# Patient Record
Sex: Female | Born: 1960 | Race: Black or African American | Hispanic: No | Marital: Single | Smoking: Never smoker
Health system: Southern US, Community
[De-identification: ages and names within clinical notes are randomized; demographics above are authoritative.]

## PROBLEM LIST (undated history)

## (undated) DIAGNOSIS — E669 Obesity, unspecified: Secondary | ICD-10-CM

## (undated) DIAGNOSIS — I10 Essential (primary) hypertension: Secondary | ICD-10-CM

---

## 2008-11-28 ENCOUNTER — Ambulatory Visit: Payer: Self-pay | Admitting: Family Medicine

## 2008-12-05 ENCOUNTER — Ambulatory Visit: Payer: Self-pay | Admitting: Family Medicine

## 2009-11-06 ENCOUNTER — Ambulatory Visit: Payer: Self-pay | Admitting: Family Medicine

## 2011-12-23 ENCOUNTER — Emergency Department: Payer: Self-pay

## 2012-02-14 ENCOUNTER — Emergency Department: Payer: Self-pay | Admitting: Emergency Medicine

## 2013-08-14 ENCOUNTER — Encounter: Payer: Self-pay | Admitting: Surgery

## 2013-08-30 ENCOUNTER — Encounter: Payer: Self-pay | Admitting: Surgery

## 2013-09-30 ENCOUNTER — Encounter: Payer: Self-pay | Admitting: Surgery

## 2013-10-31 ENCOUNTER — Encounter: Payer: Self-pay | Admitting: Surgery

## 2013-11-07 ENCOUNTER — Encounter: Payer: Self-pay | Admitting: General Surgery

## 2013-11-30 ENCOUNTER — Encounter: Payer: Self-pay | Admitting: Surgery

## 2013-11-30 ENCOUNTER — Encounter: Payer: Self-pay | Admitting: General Surgery

## 2013-12-31 ENCOUNTER — Encounter: Payer: Self-pay | Admitting: Surgery

## 2014-01-30 ENCOUNTER — Encounter: Payer: Self-pay | Admitting: Surgery

## 2018-01-01 ENCOUNTER — Other Ambulatory Visit: Payer: Self-pay

## 2018-01-01 ENCOUNTER — Emergency Department: Payer: BLUE CROSS/BLUE SHIELD

## 2018-01-01 ENCOUNTER — Encounter: Payer: Self-pay | Admitting: *Deleted

## 2018-01-01 ENCOUNTER — Inpatient Hospital Stay
Admission: EM | Admit: 2018-01-01 | Discharge: 2018-01-21 | DRG: 870 | Disposition: A | Discharge status: Skilled Nursing Facility | Payer: BLUE CROSS/BLUE SHIELD | Attending: Internal Medicine | Admitting: Internal Medicine

## 2018-01-01 DIAGNOSIS — K7589 Other specified inflammatory liver diseases: Secondary | ICD-10-CM | POA: Diagnosis present

## 2018-01-01 DIAGNOSIS — Z66 Do not resuscitate: Secondary | ICD-10-CM | POA: Diagnosis present

## 2018-01-01 DIAGNOSIS — J9622 Acute and chronic respiratory failure with hypercapnia: Secondary | ICD-10-CM | POA: Diagnosis present

## 2018-01-01 DIAGNOSIS — E872 Acidosis: Secondary | ICD-10-CM | POA: Diagnosis present

## 2018-01-01 DIAGNOSIS — M6282 Rhabdomyolysis: Secondary | ICD-10-CM

## 2018-01-01 DIAGNOSIS — Z4659 Encounter for fitting and adjustment of other gastrointestinal appliance and device: Secondary | ICD-10-CM

## 2018-01-01 DIAGNOSIS — T8579XA Infection and inflammatory reaction due to other internal prosthetic devices, implants and grafts, initial encounter: Secondary | ICD-10-CM

## 2018-01-01 DIAGNOSIS — Z713 Dietary counseling and surveillance: Secondary | ICD-10-CM

## 2018-01-01 DIAGNOSIS — E870 Hyperosmolality and hypernatremia: Secondary | ICD-10-CM | POA: Diagnosis not present

## 2018-01-01 DIAGNOSIS — K5900 Constipation, unspecified: Secondary | ICD-10-CM | POA: Diagnosis present

## 2018-01-01 DIAGNOSIS — A419 Sepsis, unspecified organism: Secondary | ICD-10-CM | POA: Diagnosis present

## 2018-01-01 DIAGNOSIS — I890 Lymphedema, not elsewhere classified: Secondary | ICD-10-CM | POA: Diagnosis present

## 2018-01-01 DIAGNOSIS — W19XXXA Unspecified fall, initial encounter: Secondary | ICD-10-CM

## 2018-01-01 DIAGNOSIS — H109 Unspecified conjunctivitis: Secondary | ICD-10-CM | POA: Diagnosis present

## 2018-01-01 DIAGNOSIS — N179 Acute kidney failure, unspecified: Secondary | ICD-10-CM | POA: Diagnosis present

## 2018-01-01 DIAGNOSIS — R0602 Shortness of breath: Secondary | ICD-10-CM

## 2018-01-01 DIAGNOSIS — R6521 Severe sepsis with septic shock: Secondary | ICD-10-CM | POA: Diagnosis present

## 2018-01-01 DIAGNOSIS — E875 Hyperkalemia: Secondary | ICD-10-CM | POA: Diagnosis present

## 2018-01-01 DIAGNOSIS — I248 Other forms of acute ischemic heart disease: Secondary | ICD-10-CM | POA: Diagnosis present

## 2018-01-01 DIAGNOSIS — R296 Repeated falls: Secondary | ICD-10-CM | POA: Diagnosis present

## 2018-01-01 DIAGNOSIS — I5031 Acute diastolic (congestive) heart failure: Secondary | ICD-10-CM | POA: Diagnosis not present

## 2018-01-01 DIAGNOSIS — J9811 Atelectasis: Secondary | ICD-10-CM | POA: Diagnosis present

## 2018-01-01 DIAGNOSIS — K7200 Acute and subacute hepatic failure without coma: Secondary | ICD-10-CM | POA: Diagnosis present

## 2018-01-01 DIAGNOSIS — T829XXA Unspecified complication of cardiac and vascular prosthetic device, implant and graft, initial encounter: Secondary | ICD-10-CM

## 2018-01-01 DIAGNOSIS — I110 Hypertensive heart disease with heart failure: Secondary | ICD-10-CM | POA: Diagnosis present

## 2018-01-01 DIAGNOSIS — L304 Erythema intertrigo: Secondary | ICD-10-CM | POA: Diagnosis present

## 2018-01-01 DIAGNOSIS — Y95 Nosocomial condition: Secondary | ICD-10-CM | POA: Diagnosis present

## 2018-01-01 DIAGNOSIS — R0902 Hypoxemia: Secondary | ICD-10-CM

## 2018-01-01 DIAGNOSIS — E662 Morbid (severe) obesity with alveolar hypoventilation: Secondary | ICD-10-CM | POA: Diagnosis present

## 2018-01-01 DIAGNOSIS — E871 Hypo-osmolality and hyponatremia: Secondary | ICD-10-CM | POA: Diagnosis present

## 2018-01-01 DIAGNOSIS — S81801A Unspecified open wound, right lower leg, initial encounter: Secondary | ICD-10-CM | POA: Diagnosis present

## 2018-01-01 DIAGNOSIS — J189 Pneumonia, unspecified organism: Secondary | ICD-10-CM | POA: Diagnosis present

## 2018-01-01 DIAGNOSIS — A4102 Sepsis due to Methicillin resistant Staphylococcus aureus: Secondary | ICD-10-CM | POA: Diagnosis present

## 2018-01-01 DIAGNOSIS — G92 Toxic encephalopathy: Secondary | ICD-10-CM | POA: Diagnosis present

## 2018-01-01 DIAGNOSIS — L03115 Cellulitis of right lower limb: Secondary | ICD-10-CM

## 2018-01-01 DIAGNOSIS — Z6841 Body Mass Index (BMI) 40.0 and over, adult: Secondary | ICD-10-CM

## 2018-01-01 DIAGNOSIS — J9602 Acute respiratory failure with hypercapnia: Secondary | ICD-10-CM | POA: Diagnosis not present

## 2018-01-01 DIAGNOSIS — Z23 Encounter for immunization: Secondary | ICD-10-CM

## 2018-01-01 DIAGNOSIS — E876 Hypokalemia: Secondary | ICD-10-CM | POA: Diagnosis present

## 2018-01-01 DIAGNOSIS — Y92009 Unspecified place in unspecified non-institutional (private) residence as the place of occurrence of the external cause: Secondary | ICD-10-CM

## 2018-01-01 DIAGNOSIS — E869 Volume depletion, unspecified: Secondary | ICD-10-CM | POA: Diagnosis present

## 2018-01-01 DIAGNOSIS — Z9911 Dependence on respirator [ventilator] status: Secondary | ICD-10-CM

## 2018-01-01 DIAGNOSIS — Z515 Encounter for palliative care: Secondary | ICD-10-CM | POA: Diagnosis not present

## 2018-01-01 DIAGNOSIS — L89159 Pressure ulcer of sacral region, unspecified stage: Secondary | ICD-10-CM | POA: Diagnosis present

## 2018-01-01 DIAGNOSIS — J9690 Respiratory failure, unspecified, unspecified whether with hypoxia or hypercapnia: Secondary | ICD-10-CM

## 2018-01-01 DIAGNOSIS — L8990 Pressure ulcer of unspecified site, unspecified stage: Secondary | ICD-10-CM

## 2018-01-01 DIAGNOSIS — J9621 Acute and chronic respiratory failure with hypoxia: Secondary | ICD-10-CM | POA: Diagnosis present

## 2018-01-01 DIAGNOSIS — Z452 Encounter for adjustment and management of vascular access device: Secondary | ICD-10-CM

## 2018-01-01 DIAGNOSIS — I2489 Other forms of acute ischemic heart disease: Secondary | ICD-10-CM

## 2018-01-01 DIAGNOSIS — Z7189 Other specified counseling: Secondary | ICD-10-CM | POA: Diagnosis not present

## 2018-01-01 DIAGNOSIS — J9600 Acute respiratory failure, unspecified whether with hypoxia or hypercapnia: Secondary | ICD-10-CM

## 2018-01-01 DIAGNOSIS — J9801 Acute bronchospasm: Secondary | ICD-10-CM | POA: Diagnosis present

## 2018-01-01 DIAGNOSIS — R509 Fever, unspecified: Secondary | ICD-10-CM

## 2018-01-01 DIAGNOSIS — I878 Other specified disorders of veins: Secondary | ICD-10-CM | POA: Diagnosis present

## 2018-01-01 HISTORY — DX: Obesity, unspecified: E66.9

## 2018-01-01 HISTORY — DX: Essential (primary) hypertension: I10

## 2018-01-01 LAB — MRSA PCR SCREENING: MRSA by PCR: POSITIVE — AB

## 2018-01-01 LAB — COMPREHENSIVE METABOLIC PANEL
ALT: 303 U/L — ABNORMAL HIGH (ref 0–44)
ANION GAP: 10 (ref 5–15)
AST: 912 U/L — AB (ref 15–41)
Albumin: 3.2 g/dL — ABNORMAL LOW (ref 3.5–5.0)
Alkaline Phosphatase: 47 U/L (ref 38–126)
BUN: 25 mg/dL — AB (ref 6–20)
CO2: 31 mmol/L (ref 22–32)
Calcium: 8.5 mg/dL — ABNORMAL LOW (ref 8.9–10.3)
Chloride: 107 mmol/L (ref 98–111)
Creatinine, Ser: 1.58 mg/dL — ABNORMAL HIGH (ref 0.44–1.00)
GFR calc Af Amer: 41 mL/min — ABNORMAL LOW (ref >60)
GFR, EST NON AFRICAN AMERICAN: 36 mL/min — AB (ref >60)
GLUCOSE: 140 mg/dL — AB (ref 70–99)
POTASSIUM: 3.7 mmol/L (ref 3.5–5.1)
Sodium: 148 mmol/L — ABNORMAL HIGH (ref 135–145)
Total Bilirubin: 1.4 mg/dL — ABNORMAL HIGH (ref 0.3–1.2)
Total Protein: 7.7 g/dL (ref 6.5–8.1)

## 2018-01-01 LAB — URINALYSIS, COMPLETE (UACMP) WITH MICROSCOPIC
BACTERIA UA: NONE SEEN
Bilirubin Urine: NEGATIVE
GLUCOSE, UA: NEGATIVE mg/dL
KETONES UR: NEGATIVE mg/dL
LEUKOCYTES UA: NEGATIVE
NITRITE: NEGATIVE
PH: 5 (ref 5.0–8.0)
Protein, ur: 100 mg/dL — AB
RBC / HPF: >50 RBC/hpf — ABNORMAL HIGH (ref 0–5)
SPECIFIC GRAVITY, URINE: 1.021 (ref 1.005–1.030)
Squamous Epithelial / LPF: NONE SEEN (ref 0–5)

## 2018-01-01 LAB — CK: Total CK: 27818 U/L — ABNORMAL HIGH (ref 38–234)

## 2018-01-01 LAB — CBC WITH DIFFERENTIAL/PLATELET
Abs Immature Granulocytes: 0.06 10*3/uL (ref 0.00–0.07)
BASOS ABS: 0 10*3/uL (ref 0.0–0.1)
Basophils Relative: 0 %
EOS ABS: 0 10*3/uL (ref 0.0–0.5)
Eosinophils Relative: 0 %
HCT: 45.8 % (ref 36.0–46.0)
Hemoglobin: 13.8 g/dL (ref 12.0–15.0)
IMMATURE GRANULOCYTES: 1 %
Lymphocytes Relative: 7 %
Lymphs Abs: 0.8 10*3/uL (ref 0.7–4.0)
MCH: 33.4 pg (ref 26.0–34.0)
MCHC: 30.1 g/dL (ref 30.0–36.0)
MCV: 110.9 fL — ABNORMAL HIGH (ref 80.0–100.0)
Monocytes Absolute: 0.8 10*3/uL (ref 0.1–1.0)
Monocytes Relative: 8 %
NEUTROS PCT: 84 %
NRBC: 0.2 % (ref 0.0–0.2)
Neutro Abs: 9.1 10*3/uL — ABNORMAL HIGH (ref 1.7–7.7)
PLATELETS: 226 10*3/uL (ref 150–400)
RBC: 4.13 MIL/uL (ref 3.87–5.11)
RDW: 18.2 % — AB (ref 11.5–15.5)
WBC: 10.8 10*3/uL — AB (ref 4.0–10.5)

## 2018-01-01 LAB — LACTIC ACID, PLASMA
LACTIC ACID, VENOUS: 2.3 mmol/L — AB (ref 0.5–1.9)
LACTIC ACID, VENOUS: 2.8 mmol/L — AB (ref 0.5–1.9)

## 2018-01-01 LAB — HCG, QUANTITATIVE, PREGNANCY: HCG, BETA CHAIN, QUANT, S: 1 m[IU]/mL (ref <5)

## 2018-01-01 LAB — TROPONIN I: Troponin I: 0.24 ng/mL (ref <0.03)

## 2018-01-01 LAB — GLUCOSE, CAPILLARY: GLUCOSE-CAPILLARY: 110 mg/dL — AB (ref 70–99)

## 2018-01-01 LAB — LIPASE, BLOOD: LIPASE: 33 U/L (ref 11–51)

## 2018-01-01 MED ORDER — ALBUTEROL SULFATE (2.5 MG/3ML) 0.083% IN NEBU
2.5000 mg | INHALATION_SOLUTION | RESPIRATORY_TRACT | Status: DC | PRN
  Administered 2018-01-08 – 2018-01-11 (×4): 2.5 mg via RESPIRATORY_TRACT
  Filled 2018-01-01 (×4): qty 3

## 2018-01-01 MED ORDER — ONDANSETRON HCL 4 MG/2ML IJ SOLN: 4.0000 mg | Freq: Four times a day (QID) | INTRAMUSCULAR | Status: DC | PRN

## 2018-01-01 MED ORDER — SODIUM CHLORIDE 0.9 % IV SOLN
INTRAVENOUS | Status: DC
  Administered 2018-01-01: 23:00:00 via INTRAVENOUS

## 2018-01-01 MED ORDER — ACETAMINOPHEN 650 MG RE SUPP: 650.0000 mg | Freq: Four times a day (QID) | RECTAL | Status: DC | PRN

## 2018-01-01 MED ORDER — POLYETHYLENE GLYCOL 3350 17 G PO PACK
17.0000 g | PACK | Freq: Every day | ORAL | Status: DC | PRN
  Filled 2018-01-01: qty 1

## 2018-01-01 MED ORDER — ENOXAPARIN SODIUM 40 MG/0.4ML ~~LOC~~ SOLN
40.0000 mg | Freq: Two times a day (BID) | SUBCUTANEOUS | Status: DC
  Administered 2018-01-01 – 2018-01-21 (×39): 40 mg via SUBCUTANEOUS
  Filled 2018-01-01 (×39): qty 0.4

## 2018-01-01 MED ORDER — SODIUM CHLORIDE 0.9 % IV SOLN
2.0000 g | Freq: Two times a day (BID) | INTRAVENOUS | Status: AC
  Administered 2018-01-01 – 2018-01-07 (×13): 2 g via INTRAVENOUS
  Filled 2018-01-01 (×13): qty 2

## 2018-01-01 MED ORDER — SODIUM CHLORIDE 0.9 % IV BOLUS: 1500.0000 mL | Freq: Once | INTRAVENOUS | Status: DC

## 2018-01-01 MED ORDER — ONDANSETRON HCL 4 MG PO TABS: 4.0000 mg | ORAL_TABLET | Freq: Four times a day (QID) | ORAL | Status: DC | PRN

## 2018-01-01 MED ORDER — SODIUM CHLORIDE 0.9 % IV SOLN
2.0000 g | INTRAVENOUS | Status: DC
  Administered 2018-01-01: 2 g via INTRAVENOUS
  Filled 2018-01-01 (×2): qty 20

## 2018-01-01 MED ORDER — VANCOMYCIN HCL IN DEXTROSE 1-5 GM/200ML-% IV SOLN
1000.0000 mg | Freq: Once | INTRAVENOUS | Status: AC
  Administered 2018-01-01: 1000 mg via INTRAVENOUS
  Filled 2018-01-01: qty 200

## 2018-01-01 MED ORDER — VANCOMYCIN HCL IN DEXTROSE 750-5 MG/150ML-% IV SOLN
750.0000 mg | Freq: Once | INTRAVENOUS | Status: AC
  Administered 2018-01-01: 750 mg via INTRAVENOUS
  Filled 2018-01-01: qty 150

## 2018-01-01 MED ORDER — SODIUM CHLORIDE 0.9 % IV BOLUS
500.0000 mL | Freq: Once | INTRAVENOUS | Status: AC
  Administered 2018-01-01: 500 mL via INTRAVENOUS

## 2018-01-01 MED ORDER — SODIUM CHLORIDE 0.9 % IV BOLUS: 500.0000 mL | Freq: Once | INTRAVENOUS | Status: DC

## 2018-01-01 MED ORDER — SODIUM CHLORIDE 0.9 % IV BOLUS
1000.0000 mL | Freq: Once | INTRAVENOUS | Status: AC
  Administered 2018-01-01: 1000 mL via INTRAVENOUS

## 2018-01-01 MED ORDER — ACETAMINOPHEN 325 MG PO TABS: 650.0000 mg | ORAL_TABLET | Freq: Four times a day (QID) | ORAL | Status: DC | PRN

## 2018-01-01 MED ORDER — SODIUM CHLORIDE 0.9% FLUSH
3.0000 mL | Freq: Two times a day (BID) | INTRAVENOUS | Status: DC
  Administered 2018-01-01 – 2018-01-05 (×8): 3 mL via INTRAVENOUS

## 2018-01-01 MED ORDER — VANCOMYCIN HCL 10 G IV SOLR
1750.0000 mg | Freq: Two times a day (BID) | INTRAVENOUS | Status: DC
  Administered 2018-01-02: 1750 mg via INTRAVENOUS
  Filled 2018-01-01 (×3): qty 1750

## 2018-01-01 NOTE — ED Notes (Addendum)
Date and time results received: 01/01/18 1940 (use smartphrase ".now" to insert current time)  Test: Lactic Critical Value: 2.3  Name of Provider Notified: Dr. Jacqualine Code  Orders Received? Or Actions Taken?: Provider aware

## 2018-01-01 NOTE — ED Provider Notes (Signed)
[facility] Emergency Department Provider Note   ____________________________________________   First MD Initiated Contact with Patient 01/01/18 1616     (approximate)  I have reviewed the triage vital signs and the nursing notes.   HISTORY  Chief Complaint Fall    HPI Stephanie [name] is a 57 y.o. female reports a history of hypertension  Patient reports that she went to work about 3 days ago, she had to work longer than normal and felt very tired.  She got home and since then she is had 3 times where she is become fatigued, she is head which she describes is not really falls but more like slides to the floor.  She has trouble with her right lower leg for a long time, and she feels that she cannot support her weight any longer.  She denies fevers or chills.  No nausea or vomiting.  No chest pain or trouble breathing  Denies head injury, no neck pain.  Did not strike her head and she reports during the fall she is really slid down onto her buttock and not been able to get up, she has not had any type of actual "hard fall".  She is not in any pain right now but feels very fatigued.  She reports she is just been getting worse the last couple of days.  Head of the paramedics come out several times, and she said this time she felt she need to come the hospital as she could not continue to her own care.  She also reports several areas where her skin has gotten broken from falls.   Past Medical History:  Diagnosis Date  . Hypertension   . Obesity     There are no active problems to display for this patient.   Past medical history Hypertension  Prior to Admission medications   Not on File    Allergies Patient has no known allergies.  No family history on file.  Social History Social History   Tobacco Use  . Smoking status: Never Smoker  . Smokeless tobacco: Never Used  Substance Use Topics  . Alcohol use: Not Currently  . Drug use: Not Currently   Denies drug abuse, no acute alcohol use  Review of Systems Constitutional: No fever/chills feeling very fatigued last 3 days Eyes: No visual changes. ENT: No sore throat. Cardiovascular: Denies chest pain. Respiratory: Denies shortness of breath. Gastrointestinal: No abdominal pain.   Genitourinary: Negative for dysuria. Musculoskeletal: Negative for back pain.  Trouble with her right knee for some time now, very swollen for months in both legs and keeps a Ace wrap around right lower leg it at times due to  skin injury Skin: Reports some rashes on her arms and legs Neurological: Negative for headaches, areas of focal weakness or numbness.    ____________________________________________   PHYSICAL EXAM:  VITAL SIGNS: ED Triage Vitals  Enc Vitals Group     BP      Pulse      Resp      Temp      Temp src      SpO2      Weight      Height      Head Circumference      Peak Flow      Pain Score      Pain Loc      Pain Edu?      Excl. in [place]?     Constitutional: Alert and oriented.  Moderately  ill-appearing but in no acute distress.  Morbid obesity is noted.  She is alert with normal mental status Eyes: Conjunctivae are injected bilaterally with slight purulent discharge from each Head: Atraumatic. Nose: No congestion/rhinnorhea. Mouth/Throat: Mucous membranes are slightly dry. Neck: No stridor.  Cardiovascular: Tachycardic rate, regular rhythm. Grossly normal heart sounds.  Good peripheral circulation. Respiratory: Normal respiratory effort for just slightly elevated rate.  No retractions. Lungs CTAB.  Diminished lung sounds throughout. Gastrointestinal: Soft and nontender. No distention.  Morbidly obese.  Skin folds evaluated and she certainly has satellite lesions and evidence of early skin breakdown and redness and multiple intertriginous skin folds. Musculoskeletal: No lower extremity tenderness but she has notable edema in both lower extremities, right lower extremity  that she had wrapped has a notable defect in it from where she had previous Ace wrap on it, this was taken down and her capillary refill in both feet is normal but she has notable severe edema involving both lower extremities. Neurologic:  Normal speech and language. No gross focal neurologic deficits are appreciated.  Skin:  Skin is warm, dry with multiple areas with skin tears redness and some with some slight purulence appearance over the right posterior leg.  There is no crepitance in any area. Psychiatric: Mood and affect are normal. Speech and behavior are normal.  ____________________________________________   LABS (all labs ordered are listed, but only abnormal results are displayed)  Labs Reviewed  CBC WITH DIFFERENTIAL/PLATELET - Abnormal; Notable for the following components:      Result Value   WBC 10.8 (*)    MCV 110.9 (*)    RDW 18.2 (*)    Neutro Abs 9.1 (*)    All other components within normal limits  COMPREHENSIVE METABOLIC PANEL - Abnormal; Notable for the following components:   Sodium 148 (*)    Glucose, Bld 140 (*)    BUN 25 (*)    Creatinine, Ser 1.58 (*)    Calcium 8.5 (*)    Albumin 3.2 (*)    AST 912 (*)    ALT 303 (*)    Total Bilirubin 1.4 (*)    GFR calc non Af Amer 36 (*)    GFR calc Af Amer 41 (*)    All other components within normal limits  TROPONIN I - Abnormal; Notable for the following components:   Troponin I 0.24 (*)    All other components within normal limits  URINALYSIS, COMPLETE (UACMP) WITH MICROSCOPIC - Abnormal; Notable for the following components:   Color, Urine AMBER (*)    APPearance TURBID (*)    Hgb urine dipstick LARGE (*)    Protein, ur 100 (*)    RBC / HPF >50 (*)    All other components within normal limits  CK - Abnormal; Notable for the following components:   Total CK 27,818 (*)    All other components within normal limits  CULTURE, BLOOD (ROUTINE X 2)  CULTURE, BLOOD (ROUTINE X 2)  LIPASE, BLOOD  HCG,  QUANTITATIVE, PREGNANCY  LACTIC ACID, PLASMA   ____________________________________________  EKG  Reviewed and entered by me at 1710 Heart rate 149 QRS 80 QTc 450 Atrial fibrillation suspected, there are some areas however were P waves may be seen in V3, though it is slightly irregular and I do not see obviously discernible P waves making me feel this is likely A. Fib there is nonspecific T wave abnormality but no obvious acute ischemia denoted ____________________________________________  RADIOLOGY  Dg Chest Port 1  View  Result Date: 01/01/2018 CLINICAL DATA:  Fall.  Morbid obesity. EXAM: PORTABLE CHEST 1 VIEW COMPARISON:  None. FINDINGS: The study is limited by the low volume portable technique in positioning. There appears to be significant enlarged of the cardiac silhouette which may represent cardiomegaly. No pneumothorax. No nodules or masses. No obvious infiltrates. The left retrocardiac region is not well assessed. IMPRESSION: 1. Enlargement of the cardiac silhouette. The study is limited but no focal infiltrate noted. The left retrocardiac region in particular is not well evaluated. Electronically Signed   By: Dorise Bullion III M.D   On: 01/01/2018 19:07   Dg Tibia/fibula Right Port  Result Date: 01/01/2018 CLINICAL DATA:  Fall. EXAM: PORTABLE RIGHT TIBIA AND FIBULA - 2 VIEW COMPARISON:  None. FINDINGS: The study is limited due to poor penetration due to the patient's body habitus. No definitive fractures noted. IMPRESSION: Limited study due to body habitus.  No obvious fractures. Electronically Signed   By: Dorise Bullion III M.D   On: 01/01/2018 19:08     ____________________________________________   PROCEDURES  Procedure(s) performed: None  Procedures  Critical Care performed: Yes, see critical care note(s)  CRITICAL CARE Performed by: Delman Kitten   Total critical care time: 40 minutes  Critical care time was exclusive of separately billable procedures and  treating other patients.  Critical care was necessary to treat or prevent imminent or life-threatening deterioration.  Critical care was time spent personally by me on the following activities: development of treatment plan with patient and/or surrogate as well as nursing, discussions with consultants, evaluation of patient's response to treatment, examination of patient, obtaining history from patient or surrogate, ordering and performing treatments and interventions, ordering and review of laboratory studies, ordering and review of radiographic studies, pulse oximetry and re-evaluation of patient's condition.  Patient presents, found to have elevated troponin, severe rhabdomyolysis, tachycardia with suspected new onset A. fib, weakness and associated cellulitis with sepsis.  Patient requiring fluid boluses, multiple antibiotics, admission for life stabilizing treatment and care. ____________________________________________   INITIAL IMPRESSION / ASSESSMENT AND PLAN / ED COURSE  Pertinent labs & imaging results that were available during my care of the patient were reviewed by me and considered in my medical decision making (see chart for details).   Fall, fatigue and increasing weakness.  Moves all extremities grossly with normal strength and denies head injury.  No numbness or tingling.  No evidence of a central neurologic process, but sounds a gradual worsening over the last few days time with obvious intertriginous skin fold likely fungal infection in also notable skin breakdown in multiple areas with concern for cellulitis especially involving the posterior right lower leg.  Will start on broad-spectrum antibiotics and I suspect her tachycardia may be secondary to possible sepsis or other metabolic demand.  Broad-spectrum antibiotic ordered.  Patient severe morbid obesity will certainly play into her recovery.,  Initiate early antibiotics, gentle IV fluids as we await imaging studies though  given the patient's body habitus I am not certain we will be able to obtain any easily interpretable chest films.  No head injury, no central neurologic abnormality on examination or gross neurologic deficit, denies striking her head and takes no anticoagulants.  Patient does not appear to be in need of acute CT imaging at this time.  No chest pain no shortness of breath per the patient but does have slight tachypnea.    ----------------------------------------- 7:37 PM on 01/01/2018 -----------------------------------------  Sepsis reassessment completed, patient heart rate slightly  improved 130s, blood pressure 109/92, mentating well with normal oxygen saturation on nasal cannula.  Does appear slightly improved, but still in critical condition with out abdominal pain but findings concerning for shock liver.  Patient be admitted for further care and work-up to the hospitalist service at this time.  ____________________________________________   FINAL CLINICAL IMPRESSION(S) / ED DIAGNOSES  Final diagnoses:  Fall  Demand ischemia ([place])  Cellulitis of right lower extremity  Shock liver  Non-traumatic rhabdomyolysis        Note:  This document was prepared using Dragon voice recognition software and may include unintentional dictation errors       Delman Kitten, MD 01/01/18 1937

## 2018-01-01 NOTE — ED Notes (Signed)
Attempted report, ICU still needs to approve bed. Number given to RN to call when ready.

## 2018-01-01 NOTE — ED Notes (Signed)
Foley draining dark  Brown  Cloudy urine   Pt remains  Awake  And  Alert   And  Oriented

## 2018-01-01 NOTE — Progress Notes (Signed)
Pharmacy Antibiotic Note  Stephanie [name] is a 57 y.o. female admitted on 01/01/2018 with wound infection.  Pharmacy has been consulted for Cefepime dosing.  Plan: Will start cefepime 2 gm IV Q12H on 11/2 @ 18:30.  Height: 5\' 4"  (162.6 cm) Weight: (!) 518 lb (235 kg) IBW/kg (Calculated) : 54.7  Temp (24hrs), Avg:98.6 F (37 C), Min:98.6 F (37 C), Max:98.6 F (37 C)  Recent Labs  Lab 01/01/18 1649  WBC 10.8*  CREATININE 1.58*    Estimated Creatinine Clearance: 80.5 mL/min (A) (by C-G formula based on SCr of 1.58 mg/dL (H)).    Allergies no known allergies  Antimicrobials this admission:   >>    >>   Dose adjustments this admission:   Microbiology results:  BCx:   UCx:    Sputum:    MRSA PCR:   Thank you for allowing pharmacy to be a part of this patient's care.  Hagar Sadiq D 01/01/2018 6:27 PM

## 2018-01-01 NOTE — ED Triage Notes (Signed)
Pt is  Morbidly obese    Arrived  Via ems  Where  She was  Brought in because she has been found on floor after  Falling  Pt has  Multiple skin tears  And    Many bandages  And  Swollen  Extremities     Pt  Has  Purulent drainage and  4 t edema   Of  r  Lower  Leg  Skin tears  Of  Both lower legs   She  Has  Red drainage  From both eyes    Pt  Has  superficial  Skin breakdown l  Side  Abdomen   And  Skin breakdown   Of  Buttocks  Area pt is awake and alert

## 2018-01-01 NOTE — H&P (Addendum)
[facility] [place] NAME: Stephanie [name]    MR#:  [id number]  DATE OF BIRTH:  1960-07-02  DATE OF ADMISSION:  01/01/2018  PRIMARY CARE PHYSICIAN: Patient, No Pcp Per   REQUESTING/REFERRING PHYSICIAN: Dr. Jacqualine [name]  CHIEF COMPLAINT:   Chief Complaint  Patient presents with  . Fall    HISTORY OF PRESENT ILLNESS:  Stephanie [name]  is a 57 y.o. female with a known history of hypertension, morbid obesity presented to the emergency room due to extreme fatigue, falls.  Patient was found to have multiple areas of skin breakdown.  Tachycardia, hypoxic respiratory failure. She has had trouble with a chronic wound on her right leg which has gotten weaker over time.  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 She was found on the floor and brought in by EMS.  Foley catheter placed in dark urine. Chest x-ray and right foot x-ray is still pending. No chest pain or abdominal pain.  PAST MEDICAL HISTORY:   Past Medical History:  Diagnosis Date  . Hypertension   . Obesity     PAST SURGICAL HISTORY:  No past surgeries SOCIAL HISTORY:   Social History   Tobacco Use  . Smoking status: Never Smoker  . Smokeless tobacco: Never Used  Substance Use Topics  . Alcohol use: Not Currently    FAMILY HISTORY:  No family history on file. Attention DRUG ALLERGIES:  Allergies no known allergies  REVIEW OF SYSTEMS:   Review of Systems  Constitutional: Positive for chills and malaise/fatigue. Negative for fever and weight loss.  HENT: Negative for hearing loss and nosebleeds.   Eyes: Negative for blurred vision, double vision and pain.  Respiratory: Negative for cough, hemoptysis, sputum production, shortness of breath and wheezing.   Cardiovascular: Negative for chest pain, palpitations, orthopnea and leg swelling.  Gastrointestinal: Negative for abdominal pain, constipation, diarrhea, nausea and vomiting.  Genitourinary:  Negative for dysuria and hematuria.  Musculoskeletal: Positive for falls. Negative for back pain and myalgias.  Skin: Negative for rash.  Neurological: Positive for dizziness. Negative for tremors, sensory change, speech change, focal weakness, seizures and headaches.  Endo/Heme/Allergies: Does not bruise/bleed easily.  Psychiatric/Behavioral: Negative for depression and memory loss. The patient is not nervous/anxious.     MEDICATIONS AT HOME:   Prior to Admission medications   Not on File     VITAL SIGNS:  Blood pressure (!) 150/117, pulse (!) 140, temperature 98.6 F (37 C), temperature source Oral, resp. rate (!) 24, height 5\' 4"  (1.626 m), weight (!) 235 kg, SpO2 92 %.  PHYSICAL EXAMINATION:  Physical Exam  GENERAL:  57 y.o.-year-old patient lying in the bed, morbidly obese. EYES: Pupils equal, round, reactive to light and accommodation. No scleral icterus. Extraocular muscles intact.  HEENT: Head atraumatic, normocephalic. Oropharynx and nasopharynx clear. No oropharyngeal erythema, moist oral mucosa  NECK:  Supple, no jugular venous distention. No thyroid enlargement, no tenderness.  LUNGS: Normal breath sounds bilaterally, no wheezing, rales, rhonchi. No use of accessory muscles of respiration.  CARDIOVASCULAR: S1, S2, tachycardia ABDOMEN: Soft, nontender, nondistended. Bowel sounds present. No organomegaly or mass.  EXTREMITIES: No pedal edema, cyanosis, or clubbing. + 2 pedal & radial pulses b/l.   NEUROLOGIC: Cranial nerves II through XII are intact. No focal Motor or sensory deficits appreciated b/l PSYCHIATRIC: The patient is alert and oriented x 3. Good affect.  SKIN: Multiple areas of skin breakdown including left abdomen, right leg with  purulent drainage.  Skin tears in bilateral legs .  Skin breakdown on buttocks  LABORATORY PANEL:   CBC Recent Labs  Lab 01/01/18 1649  WBC 10.8*  HGB 13.8  HCT 45.8  PLT 226    ------------------------------------------------------------------------------------------------------------------  Chemistries  Recent Labs  Lab 01/01/18 1649  NA 148*  K 3.7  CL 107  CO2 31  GLUCOSE 140*  BUN 25*  CREATININE 1.58*  CALCIUM 8.5*  AST 912*  ALT 303*  ALKPHOS 47  BILITOT 1.4*   ------------------------------------------------------------------------------------------------------------------  Cardiac Enzymes Recent Labs  Lab 01/01/18 1649  TROPONINI 0.24*   ------------------------------------------------------------------------------------------------------------------  RADIOLOGY:  No results found.   IMPRESSION AND PLAN:   *Sepsis secondary to right lower extremity cellulitis.   Will start broad-spectrum antibiotics with vancomycin and cefepime.  Cultures.  Follow-up on x-rays.  IV fluid normal saline bolus stat. Admit to stepdown as blood pressure is slowly trending down.  Discussed with [facility] intensivist.  Consult placed.  *Acute kidney injury secondary to sepsis.  Start IV fluids.  Monitor input and output.  Repeat labs in the morning.  *Hypertension.  Blood pressure in the normal range.  Hold medications.  *Elevated troponin due to rhabdomyolysis and demand ischemia No chest pain EKG shows no ST changes  *Acute rhabdomyolysis due to recurrent falls.  Start IV fluids.  Repeat CK in the morning.  *Elevated AST ALT due to rhabdomyolysis.  All the records are reviewed and case discussed with ED provider. Management plans discussed with the patient, family and they are in agreement.  [name] STATUS: DNR  Discussed with patient.  She tells me she had designated her mother as healthcare power of attorney in the past.  Also tells me that mom has been more confused and would like her brother to make decisions if she cannot.  No documentation in place.  We discussed regarding [name] STATUS with nurse present in the room and the patient wants to be DO  NOT RESUSCITATE and DO NOT INTUBATE.  Confirmed again.  Orders entered.  TOTAL CC TIME TAKING CARE OF THIS PATIENT: 80 minutes.   Neita Carp M.D on 01/01/2018 at 6:15 PM  Between 7am to 6pm - Pager - [phone]  After 6pm go to [email or web address] - password [email or web address] [facility]  Office  [phone]  CC: Primary care physician; Patient, No Pcp Per  Note: This dictation was prepared with Dragon dictation along with smaller phrase technology. Any transcriptional errors that result from this process are unintentional.

## 2018-01-01 NOTE — ED Notes (Signed)
Portable  X  Rays  Of leg done  By  X ray

## 2018-01-01 NOTE — Progress Notes (Signed)
CODE SEPSIS - PHARMACY COMMUNICATION  **Broad Spectrum Antibiotics should be administered within 1 hour of Sepsis diagnosis**  Time Code Sepsis Called/Page Received:  11/2 @ 17:30  Antibiotics Ordered:  Vancomycin , Ceftriaxone   Time of 1st antibiotic administration: 11/2 @ 17:51   Additional action taken by pharmacy: none  If necessary, Name of Provider/Nurse Contacted:     Mckaila Duffus D ,PharmD Clinical Pharmacist  01/01/2018  6:28 PM

## 2018-01-02 ENCOUNTER — Inpatient Hospital Stay: Payer: BLUE CROSS/BLUE SHIELD

## 2018-01-02 ENCOUNTER — Inpatient Hospital Stay: Payer: Self-pay

## 2018-01-02 ENCOUNTER — Other Ambulatory Visit: Payer: Self-pay

## 2018-01-02 ENCOUNTER — Inpatient Hospital Stay (HOSPITAL_COMMUNITY)
Admit: 2018-01-02 | Discharge: 2018-01-02 | Disposition: A | Discharge status: Home / Self Care | Payer: BLUE CROSS/BLUE SHIELD | Attending: Internal Medicine | Admitting: Internal Medicine

## 2018-01-02 DIAGNOSIS — J9601 Acute respiratory failure with hypoxia: Secondary | ICD-10-CM

## 2018-01-02 DIAGNOSIS — I5030 Unspecified diastolic (congestive) heart failure: Secondary | ICD-10-CM

## 2018-01-02 LAB — COMPREHENSIVE METABOLIC PANEL
ALBUMIN: 2.7 g/dL — AB (ref 3.5–5.0)
ALT: 287 U/L — ABNORMAL HIGH (ref 0–44)
ANION GAP: 10 (ref 5–15)
AST: 719 U/L — ABNORMAL HIGH (ref 15–41)
Alkaline Phosphatase: 43 U/L (ref 38–126)
BILIRUBIN TOTAL: 1.1 mg/dL (ref 0.3–1.2)
BUN: 25 mg/dL — ABNORMAL HIGH (ref 6–20)
CALCIUM: 8.1 mg/dL — AB (ref 8.9–10.3)
CO2: 26 mmol/L (ref 22–32)
Chloride: 113 mmol/L — ABNORMAL HIGH (ref 98–111)
Creatinine, Ser: 1.13 mg/dL — ABNORMAL HIGH (ref 0.44–1.00)
GFR calc Af Amer: >60 mL/min (ref >60)
GFR calc non Af Amer: 54 mL/min — ABNORMAL LOW (ref >60)
Glucose, Bld: 110 mg/dL — ABNORMAL HIGH (ref 70–99)
Potassium: 5.4 mmol/L — ABNORMAL HIGH (ref 3.5–5.1)
SODIUM: 149 mmol/L — AB (ref 135–145)
TOTAL PROTEIN: 6.6 g/dL (ref 6.5–8.1)

## 2018-01-02 LAB — BASIC METABOLIC PANEL
Anion gap: 7 (ref 5–15)
BUN: 24 mg/dL — ABNORMAL HIGH (ref 6–20)
CALCIUM: 7.9 mg/dL — AB (ref 8.9–10.3)
CO2: 31 mmol/L (ref 22–32)
Chloride: 112 mmol/L — ABNORMAL HIGH (ref 98–111)
Creatinine, Ser: 1.01 mg/dL — ABNORMAL HIGH (ref 0.44–1.00)
GFR calc Af Amer: >60 mL/min (ref >60)
GLUCOSE: 95 mg/dL (ref 70–99)
Potassium: 3.7 mmol/L (ref 3.5–5.1)
Sodium: 150 mmol/L — ABNORMAL HIGH (ref 135–145)

## 2018-01-02 LAB — PHOSPHORUS: Phosphorus: 3 mg/dL (ref 2.5–4.6)

## 2018-01-02 LAB — CBC
HCT: 45.4 % (ref 36.0–46.0)
Hemoglobin: 13.6 g/dL (ref 12.0–15.0)
MCH: 33.3 pg (ref 26.0–34.0)
MCHC: 30 g/dL (ref 30.0–36.0)
MCV: 111 fL — ABNORMAL HIGH (ref 80.0–100.0)
Platelets: 204 10*3/uL (ref 150–400)
RBC: 4.09 MIL/uL (ref 3.87–5.11)
RDW: 18.3 % — AB (ref 11.5–15.5)
WBC: 13.1 10*3/uL — AB (ref 4.0–10.5)
nRBC: 0.2 % (ref 0.0–0.2)

## 2018-01-02 LAB — BLOOD GAS, ARTERIAL
Acid-Base Excess: 0.9 mmol/L (ref 0.0–2.0)
Acid-Base Excess: 4.3 mmol/L — ABNORMAL HIGH (ref 0.0–2.0)
Bicarbonate: 32.4 mmol/L — ABNORMAL HIGH (ref 20.0–28.0)
Bicarbonate: 30.3 mmol/L — ABNORMAL HIGH (ref 20.0–28.0)
FIO2: 0.38
FIO2: 0.4
O2 Saturation: 89.9 %
O2 Saturation: 94.1 %
PATIENT TEMPERATURE: 37
PCO2 ART: 91 mmHg — AB (ref 32.0–48.0)
PEEP: 5 cmH2O
PH ART: 7.16 — AB (ref 7.350–7.450)
PH ART: 7.39 (ref 7.350–7.450)
PO2 ART: 74 mmHg — AB (ref 83.0–108.0)
PO2 ART: 72 mmHg — AB (ref 83.0–108.0)
Patient temperature: 37
RATE: 20 resp/min
VT: 600 mL
pCO2 arterial: 50 mmHg — ABNORMAL HIGH (ref 32.0–48.0)

## 2018-01-02 LAB — LACTIC ACID, PLASMA: Lactic Acid, Venous: 1.4 mmol/L (ref 0.5–1.9)

## 2018-01-02 LAB — GLUCOSE, CAPILLARY
GLUCOSE-CAPILLARY: 105 mg/dL — AB (ref 70–99)
GLUCOSE-CAPILLARY: 70 mg/dL (ref 70–99)
GLUCOSE-CAPILLARY: 92 mg/dL (ref 70–99)
Glucose-Capillary: 91 mg/dL (ref 70–99)
Glucose-Capillary: 81 mg/dL (ref 70–99)

## 2018-01-02 LAB — HEPATIC FUNCTION PANEL
ALT: 241 U/L — AB (ref 0–44)
AST: 526 U/L — ABNORMAL HIGH (ref 15–41)
Albumin: 2.6 g/dL — ABNORMAL LOW (ref 3.5–5.0)
Alkaline Phosphatase: 40 U/L (ref 38–126)
BILIRUBIN DIRECT: 0.2 mg/dL (ref 0.0–0.2)
Indirect Bilirubin: 1 mg/dL — ABNORMAL HIGH (ref 0.3–0.9)
Total Bilirubin: 1.2 mg/dL (ref 0.3–1.2)
Total Protein: 5.9 g/dL — ABNORMAL LOW (ref 6.5–8.1)

## 2018-01-02 LAB — CK: CK TOTAL: 10211 U/L — AB (ref 38–234)

## 2018-01-02 LAB — MAGNESIUM: Magnesium: 2.3 mg/dL (ref 1.7–2.4)

## 2018-01-02 LAB — ECHOCARDIOGRAM COMPLETE
Height: 64 in
Weight: 7957.72 oz

## 2018-01-02 LAB — PROCALCITONIN: PROCALCITONIN: 1.08 ng/mL

## 2018-01-02 MED ORDER — SODIUM CHLORIDE 0.9% FLUSH
10.0000 mL | Freq: Two times a day (BID) | INTRAVENOUS | Status: DC
  Administered 2018-01-02 – 2018-01-08 (×10): 10 mL
  Administered 2018-01-08: 20 mL
  Administered 2018-01-09: 10 mL
  Administered 2018-01-09 – 2018-01-10 (×2): 20 mL
  Administered 2018-01-10 – 2018-01-15 (×10): 10 mL
  Administered 2018-01-15: 22:00:00 20 mL
  Administered 2018-01-16 – 2018-01-18 (×5): 10 mL
  Administered 2018-01-18: 12:00:00 20 mL
  Administered 2018-01-19 – 2018-01-21 (×5): 10 mL

## 2018-01-02 MED ORDER — VECURONIUM BROMIDE 10 MG IV SOLR: 10.0000 mg | Freq: Once | INTRAVENOUS | Status: DC

## 2018-01-02 MED ORDER — INSULIN ASPART 100 UNIT/ML IV SOLN
10.0000 [IU] | Freq: Once | INTRAVENOUS | Status: AC
  Administered 2018-01-02: 10 [IU] via INTRAVENOUS
  Filled 2018-01-02: qty 0.1

## 2018-01-02 MED ORDER — VANCOMYCIN HCL 10 G IV SOLR
1250.0000 mg | Freq: Three times a day (TID) | INTRAVENOUS | Status: DC
  Administered 2018-01-02 – 2018-01-03 (×4): 1250 mg via INTRAVENOUS
  Filled 2018-01-02 (×6): qty 1250

## 2018-01-02 MED ORDER — SODIUM BICARBONATE 8.4 % IV SOLN
50.0000 meq | Freq: Once | INTRAVENOUS | Status: AC
  Administered 2018-01-02: 50 meq via INTRAVENOUS
  Filled 2018-01-02: qty 50

## 2018-01-02 MED ORDER — SODIUM CHLORIDE 0.9% FLUSH
10.0000 mL | INTRAVENOUS | Status: DC | PRN
  Administered 2018-01-02 – 2018-01-12 (×2): 10 mL
  Filled 2018-01-02 (×2): qty 40

## 2018-01-02 MED ORDER — CHLORHEXIDINE GLUCONATE CLOTH 2 % EX PADS
6.0000 | MEDICATED_PAD | Freq: Every day | CUTANEOUS | Status: AC
  Administered 2018-01-06 – 2018-01-07 (×2): 6 via TOPICAL

## 2018-01-02 MED ORDER — ORAL CARE MOUTH RINSE
15.0000 mL | OROMUCOSAL | Status: DC
  Administered 2018-01-02 – 2018-01-07 (×53): 15 mL via OROMUCOSAL

## 2018-01-02 MED ORDER — FENTANYL 2500MCG IN NS 250ML (10MCG/ML) PREMIX INFUSION
INTRAVENOUS | Status: AC
  Filled 2018-01-02: qty 250

## 2018-01-02 MED ORDER — SODIUM CHLORIDE 0.45 % IV SOLN
INTRAVENOUS | Status: DC
  Administered 2018-01-02 – 2018-01-05 (×9): via INTRAVENOUS

## 2018-01-02 MED ORDER — FAMOTIDINE IN NACL 20-0.9 MG/50ML-% IV SOLN
20.0000 mg | INTRAVENOUS | Status: DC
  Administered 2018-01-02 – 2018-01-03 (×2): 20 mg via INTRAVENOUS
  Filled 2018-01-02 (×2): qty 50

## 2018-01-02 MED ORDER — VITAL HIGH PROTEIN PO LIQD
1000.0000 mL | ORAL | Status: DC
  Administered 2018-01-02 – 2018-01-06 (×5): 1000 mL

## 2018-01-02 MED ORDER — ALBUTEROL SULFATE (2.5 MG/3ML) 0.083% IN NEBU
10.0000 mg | INHALATION_SOLUTION | Freq: Once | RESPIRATORY_TRACT | Status: AC
  Administered 2018-01-02: 10 mg via RESPIRATORY_TRACT
  Filled 2018-01-02: qty 12

## 2018-01-02 MED ORDER — CHLORHEXIDINE GLUCONATE 0.12% ORAL RINSE (MEDLINE KIT)
15.0000 mL | Freq: Two times a day (BID) | OROMUCOSAL | Status: DC
  Administered 2018-01-02 – 2018-01-07 (×11): 15 mL via OROMUCOSAL

## 2018-01-02 MED ORDER — ETOMIDATE 2 MG/ML IV SOLN
INTRAVENOUS | Status: AC
  Administered 2018-01-02: 20 mg
  Filled 2018-01-02: qty 10

## 2018-01-02 MED ORDER — VECURONIUM BROMIDE 10 MG IV SOLR
INTRAVENOUS | Status: AC
  Filled 2018-01-02: qty 10

## 2018-01-02 MED ORDER — FENTANYL CITRATE (PF) 100 MCG/2ML IJ SOLN
INTRAMUSCULAR | Status: AC
  Administered 2018-01-02: 100 ug
  Filled 2018-01-02: qty 2

## 2018-01-02 MED ORDER — FENTANYL 2500MCG IN NS 250ML (10MCG/ML) PREMIX INFUSION
0.0000 ug/h | INTRAVENOUS | Status: DC
  Administered 2018-01-02: 200 ug/h via INTRAVENOUS
  Administered 2018-01-02 – 2018-01-03 (×2): 250 ug/h via INTRAVENOUS
  Filled 2018-01-02 (×2): qty 250

## 2018-01-02 MED ORDER — FENTANYL CITRATE (PF) 100 MCG/2ML IJ SOLN: 100.0000 ug | Freq: Once | INTRAMUSCULAR | Status: DC

## 2018-01-02 MED ORDER — ETOMIDATE 2 MG/ML IV SOLN: 0.3000 mg/kg | Freq: Once | INTRAVENOUS | Status: DC

## 2018-01-02 MED ORDER — DEXTROSE 50 % IV SOLN
1.0000 | Freq: Once | INTRAVENOUS | Status: AC
  Administered 2018-01-02: 50 mL via INTRAVENOUS
  Filled 2018-01-02: qty 50

## 2018-01-02 MED ORDER — MUPIROCIN 2 % EX OINT
1.0000 "application " | TOPICAL_OINTMENT | Freq: Two times a day (BID) | CUTANEOUS | Status: AC
  Administered 2018-01-02 – 2018-01-07 (×10): 1 via NASAL
  Filled 2018-01-02: qty 22

## 2018-01-02 MED ORDER — PRO-STAT SUGAR FREE PO LIQD
30.0000 mL | Freq: Two times a day (BID) | ORAL | Status: DC
  Administered 2018-01-02 – 2018-01-03 (×2): 30 mL

## 2018-01-02 NOTE — Progress Notes (Signed)
Patient has been tachycardic and hypotensive most of the shift. Transferred to bariatric bed and repositioned. Alert and able to answer questions but very lethargic. Has 1000 ml fluid bolus once admitted to unit and on NS at 100. Pain free, no other concerns. Wounds dressed with pink foam on abdomen and bilateral lower legs. Continue to monitor.

## 2018-01-02 NOTE — Progress Notes (Signed)
PT Cancellation Note  Patient Details Name: Stephanie [name] MRN: [id number] DOB: 1961-02-14   Cancelled Treatment:    Reason Eval/Treat Not Completed: Medical issues which prohibited therapy.  Order received.  Chart reviewed.  Per lab values, pt has a K+ level which has trended up to 5.4.  Will hold until pt is more appropriate for PT.   Roxanne Gates, PT, DPT 01/02/2018, 10:44 AM

## 2018-01-02 NOTE — Consult Note (Addendum)
   Name: Stephanie [name] MRN: [id number] DOB: 1960/11/28     CONSULTATION DATE: 01/01/2018 HISTORY OF PRESENT ILLNESS:  57 years old lady with history of morbid obesity hypertension.  Patient presented to the ER with lethargy generalized weakness and frequent falls recently.  She was found to have cellulitis of the right lower extremities with acute kidney injury and rhabdomyolysis. All history was obtained from EMR and the patient is currently in ICU unit on nasal cannula in no distress.  PAST MEDICAL HISTORY :   has a past medical history of Hypertension and Obesity.  has no past surgical history on file. Prior to Admission medications   Not on File   No Known Allergies  FAMILY HISTORY:  family history is not on file. SOCIAL HISTORY:  reports that she has never smoked. She has never used smokeless tobacco. She reports that she drank alcohol. She reports that she has current or past drug history.  REVIEW OF SYSTEMS:   Unable to obtain due to critical illness   VITAL SIGNS: Temp:  [98.6 F (37 C)-100.3 F (37.9 C)] 98.9 F (37.2 C) (11/03 0800) Pulse Rate:  [83-140] 89 (11/03 0900) Resp:  [0-25] 16 (11/03 0900) BP: (57-162)/(25-150) 105/61 (11/03 0900) SpO2:  [83 %-100 %] 96 % (11/03 0900) Weight:  [225.6 kg-235 kg] 225.6 kg (11/03 0500)  Physical Examination:  Somnolent, arousable, oriented moving all extremities and non-focal neuro exam Tolerating nasal cannula 5 L/min, no distress, bilateral equal air entry and bibasilar medium crackles S1 & S2 are audible no murmur Obese abdomen, significant edema the lower anterior abdominal wall and feeble peristalsis Bilateral leg edema with chronic skin changes   ASSESSMENT / PLAN:  Acute on chronic respiratory failure with morbid obesity obesity hypoventilation syndrome and body habitus suggestive of obstructive sleep apnea.  Tolerating nasal cannula -Monitor pattern of breathing, nocturnal CPAP and as needed -Monitor  ABG  Altered mental status with somnolence.  Toxic metabolic/hypercapnic encephalopathy.  Nonfocal neuro exam -Monitor neuro status and consider CT head if no improvement  Atelectasis and pneumonia.  Left lower airspace disease -Cefepime + vancomycin.  Monitor CXR + CBC + FiO2 requirement  Elevated troponin, likely demand versus supply mismatch.  No chest pain no history of coronary artery disease.  EKG shows inferior ischemic changes with QTC prolongation.  Cardiomegaly on chest x-ray -Monitor cardiac enzymes, EKG and echocardiogram -Consider cardiology evaluation -Start on aspirin  Hypotension with intravascular volume depletion, infective etiology cannot be ruled out. -Optimize volume, consider vasopressors for systolic blood pressure less than 90 and monitor hemodynamics -Continue with empiric antimicrobial cefepime + vancomycin, monitor procalcitonin and cultures.  AKI, hyponatremia, hyperkalemia with rhabdomyolysis and intravascular volume depletion -Optimize hydration, avoid nephrotoxins, temporizing agents for hyperkalemia, monitor renal panel and urine output -Renal ultrasound and consider renal consult  Elevated LFTs with acute ischemic hepatitis due to hypoperfusion -Optimize hemodynamics, avoid hepatotoxins, monitor LFTs -Consider liver ultrasound if no improvement  Lactic acidemia with sepsis and tissue hypoperfusion. -Optimize hemodynamics, hydration and monitor lactic acid level   Full code  DVT & GI prophylaxis.  Continue with supportive care  Critical care time 65 minutes   11:20  Mental status continues to decline was ABG showing acute hypercapnic respiratory failure.  Patient had to be emergently intubated

## 2018-01-02 NOTE — Progress Notes (Signed)
Pharmacy Antibiotic Note  Stephanie [name] is a 57 y.o. female admitted on 01/01/2018 with wound infection.  Pharmacy has been consulted for Cefepime and vancomycin dosing.  Plan: Continue cefepime 2 gm IV Q12H  Creatinine clearance has improved so will modify vancomycin to 1.25 gm IV Q8H, predicted trough 16 mcg/ml. Pharmacy will continue to follow and adjust as needed to maintain trough 15 to 20 mcg/ml.   Vd 86.2 L, Ke 0.095 hr-1, T1/2 7.3 hr  Height: 5\' 4"  (162.6 cm) Weight: (!) 497 lb 5.7 oz (225.6 kg) IBW/kg (Calculated) : 54.7  Temp (24hrs), Avg:99.3 F (37.4 C), Min:98.6 F (37 C), Max:100.3 F (37.9 C)  Recent Labs  Lab 01/01/18 1649 01/01/18 1859 01/01/18 2221 01/02/18 0526  WBC 10.8*  --   --  13.1*  CREATININE 1.58*  --   --  1.13*  LATICACIDVEN  --  2.3* 2.8*  --     Estimated Creatinine Clearance: 109.3 mL/min (A) (by C-G formula based on SCr of 1.13 mg/dL (H)).    No Known Allergies  Antimicrobials this admission:   >>    >>   Dose adjustments this admission:   Microbiology results:  BCx:   UCx:    Sputum:    MRSA PCR: positive  Thank you for allowing pharmacy to be a part of this patient's care.  Laural Benes, Pharm.D., BCPS Clinical Pharmacist 01/02/2018 12:04 PM

## 2018-01-02 NOTE — Progress Notes (Signed)
Spoke with NP Patria Mane and made her aware that patient's blood glucose is 70.  NP acknowledged and stated that she would place some orders.

## 2018-01-02 NOTE — Progress Notes (Signed)
Spoke with RN re PICC order.  Blood cultures pending from [facility], BP low this am, currently normal.  Recommend CVC until [facility] are negative x 48 hrs.  2 PIV currently working.

## 2018-01-02 NOTE — Procedures (Signed)
Endotracheal Intubation Procedure Note  Indication for endotracheal intubation: airway compromise and respiratory failure. Airway Assessment: Mallampati Class: IV (only hard palate visible). Sedation: etomidate and fentanyl. Paralytic: none. Lidocaine: no. Atropine: no. Equipment: Glide scope and 8 Fr laryngoscope blade. Cricoid Pressure: no. Number of attempts: 1. ETT location confirmed by by auscultation, by CXR and ETCO2 monitor.  Patient tolerated the procedure  [facility] 01/02/2018

## 2018-01-02 NOTE — Progress Notes (Signed)
*  PRELIMINARY RESULTS* Echocardiogram 2D Echocardiogram has been performed.  Stephanie [name] Stephanie [name] 01/02/2018, 2:02 PM

## 2018-01-02 NOTE — Progress Notes (Signed)
[facility] NAME: Stephanie [name]    MR#:  [id number]  DATE OF BIRTH:  November 01, 1960  SUBJECTIVE:   Patient came into the ER with frequent falls. She was found to have acute rhabdomyolysis. She was lethargic. Placed in step down unit. Got intubated this morning given patient unable to protect the airway. No family in the ICU REVIEW OF SYSTEMS:   Review of Systems  Unable to perform ROS: Intubated   Tolerating Diet: Tolerating PT:   DRUG ALLERGIES:  No Known Allergies  VITALS:  Blood pressure 115/77, pulse 86, temperature 98.8 F (37.1 C), temperature source Axillary, resp. rate 20, height 5\' 4"  (1.626 m), weight (!) 225.6 kg, SpO2 95 %.  PHYSICAL EXAMINATION:   Physical Exam  GENERAL:  57 y.o.-year-old patient lying in the bed with no acute distress. Morbidly obese EYES: Pupils equal, round, reactive to light and accommodation. No scleral icterus. Extraocular muscles intact.  HEENT: Head atraumatic, normocephalic. Oropharynx and nasopharynx clear. Intubated on the ventilator NECK:  Supple, no jugular venous distention. No thyroid enlargement, no tenderness.  LUNGS: Normal breath sounds bilaterally, no wheezing, rales, rhonchi. No use of accessory muscles of respiration.  CARDIOVASCULAR: S1, S2 normal. No murmurs, rubs, or gallops.  ABDOMEN: Soft, nontender, nondistended. Bowel sounds present. No organomegaly or mass.  EXTREMITIES: No cyanosis, clubbing or edema b/l.    NEUROLOGIC: on the ventilator PSYCHIATRIC: on the ventilator SKIN: No obvious rash, lesion, or ulcer.   LABORATORY PANEL:  CBC Recent Labs  Lab 01/02/18 0526  WBC 13.1*  HGB 13.6  HCT 45.4  PLT 204    Chemistries  Recent Labs  Lab 01/02/18 0526  NA 149*  K 5.4*  CL 113*  CO2 26  GLUCOSE 110*  BUN 25*  CREATININE 1.13*  CALCIUM 8.1*  AST 719*  ALT 287*  ALKPHOS 43  BILITOT 1.1   Cardiac Enzymes Recent Labs  Lab 01/01/18 1649   TROPONINI 0.24*   RADIOLOGY:  Dg Abd 1 View  Result Date: 01/02/2018 CLINICAL DATA:  NG tube placement EXAM: ABDOMEN - 1 VIEW COMPARISON:  None. FINDINGS: Limited exam secondary to body habitus. NG tube appears within stomach IMPRESSION: NG tube within stomach. Electronically Signed   By: Suzy Bouchard M.D.   On: 01/02/2018 12:40   Dg Chest Port 1 View  Result Date: 01/02/2018 CLINICAL DATA:  Check central line placement EXAM: PORTABLE CHEST 1 VIEW COMPARISON:  01/02/2018 FINDINGS: The examination is significantly limited by patient's body habitus. Previously seen central line is faintly visualized although the tip is not discretely seen. Endotracheal tube and nasogastric catheter are again noted. The lungs are again well aerated with mild vascular congestion. No pneumothorax is noted. IMPRESSION: Stable appearance of the chest. The right jugular central line is not well appreciated on this study in part due to patient body habitus as well as motion. Changes of vascular congestion are again seen Electronically Signed   By: Inez Catalina M.D.   On: 01/02/2018 14:54   Dg Chest Port 1 View  Result Date: 01/02/2018 CLINICAL DATA:  Central line placement. EXAM: PORTABLE CHEST 1 VIEW COMPARISON:  Chest x-ray from same day at 11:55 a.m. FINDINGS: Endotracheal tube tip approximately 1.8 cm above the carina. Unchanged enteric tube. Interval placement of a right internal jugular central venous catheter with the tip near the confluence of the internal jugular and brachiocephalic veins. Stable cardiomegaly and mild interstitial edema. Unchanged retrocardiac opacification and probable  left pleural effusion. The right lung is clear. No pneumothorax. No acute osseous abnormality. IMPRESSION: 1. Right internal jugular central venous catheter with tip near the confluence of the internal jugular and brachiocephalic veins. Consider advancing or exchanging for a longer catheter. 2. No pneumothorax. 3. Stable congestive  heart failure and retrocardiac airspace disease, likely atelectasis. Electronically Signed   By: Titus Dubin M.D.   On: 01/02/2018 13:27   Dg Chest Port 1 View  Result Date: 01/02/2018 CLINICAL DATA:  Endotracheal tube and OG tube placement. EXAM: PORTABLE CHEST 1 VIEW COMPARISON:  One-view chest x-ray 01/01/2018 FINDINGS: Heart is enlarged. Mild edema has slightly increased. Retrocardiac opacification is present. No significant right-sided effusion is present. The patient has been intubated. The endotracheal tube terminates 2.5 cm above the carina. OG tube courses off the inferior border of the film. IMPRESSION: 1. Intubation. The endotracheal tube terminates 2.5 cm above the carina. 2. Cardiomegaly with increasing interstitial edema and congestive heart failure. 3. Retrocardiac airspace disease likely reflects atelectasis. Electronically Signed   By: San Morelle M.D.   On: 01/02/2018 12:38   Dg Chest Port 1 View  Result Date: 01/01/2018 CLINICAL DATA:  Fall.  Morbid obesity. EXAM: PORTABLE CHEST 1 VIEW COMPARISON:  None. FINDINGS: The study is limited by the low volume portable technique in positioning. There appears to be significant enlarged of the cardiac silhouette which may represent cardiomegaly. No pneumothorax. No nodules or masses. No obvious infiltrates. The left retrocardiac region is not well assessed. IMPRESSION: 1. Enlargement of the cardiac silhouette. The study is limited but no focal infiltrate noted. The left retrocardiac region in particular is not well evaluated. Electronically Signed   By: Dorise Bullion III M.D   On: 01/01/2018 19:07   Dg Tibia/fibula Right Port  Result Date: 01/01/2018 CLINICAL DATA:  Fall. EXAM: PORTABLE RIGHT TIBIA AND FIBULA - 2 VIEW COMPARISON:  None. FINDINGS: The study is limited due to poor penetration due to the patient's body habitus. No definitive fractures noted. IMPRESSION: Limited study due to body habitus.  No obvious fractures.  Electronically Signed   By: Dorise Bullion III M.D   On: 01/01/2018 19:08   [place] Ekg Site Rite  Result Date: 01/02/2018 If Site Rite image not attached, placement could not be confirmed due to current cardiac rhythm.  ASSESSMENT AND PLAN:  Stephanie [name]  is a 57 y.o. female with a known history of hypertension, morbid obesity presented to the emergency room due to extreme fatigue, falls.  Patient was found to have multiple areas of skin breakdown.  Tachycardia, hypoxic respiratory failure. She has had trouble with a chronic wound on her right leg which has gotten weaker over time.    *Sepsis secondary to right lower extremity cellulitis.   -Will start broad-spectrum antibiotics with vancomycin and cefepime.   -Cultures.  Follow-up on x-rays.  IV fluid normal saline bolus stat.  *Acute hypoxic respiratory failure in the setting of sepsis with obesity hypoventilation syndrome -patient was falling asleep and not able to protect her areas. She got intubated and placed on the ventilator -appreciate ICU attending input  *Acute kidney injury secondary to sepsis.  -IV fluids.  Monitor input and output.  Repeat labs in the morning.  *Hypertension.  Blood pressure in the normal range.  Hold medications.  *Elevated troponin due to rhabdomyolysis and demand ischemia No chest pain EKG shows no ST changes  *Acute rhabdomyolysis due to recurrent falls.  - IV fluids.  Repeat CK in the morning.  Case discussed with Care Management/Social Worker. Management plans discussed with the patient, family and they are in agreement.  CODE STATUS: full  DVT Prophylaxis: Lovenox  TOTAL TIME TAKING CARE OF THIS PATIENT: *30* minutes.  >50% time spent on counselling and coordination of care  POSSIBLE D/C IN ?? DAYS, DEPENDING ON CLINICAL CONDITION.  Note: This dictation was prepared with Dragon dictation along with smaller phrase technology. Any transcriptional errors that result from this  process are unintentional.  Fritzi Mandes M.D on 01/02/2018 at 3:28 PM  Between 7am to 6pm - Pager - [phone]  After 6pm go to [email or web address] - password [email or web address]  [facility]  Office  [phone]  CC: Primary care physician; Patient, No Pcp PerPatient ID: Stephanie [name], female   DOB: [birth year], 57 y.o.   MRN: [id number]

## 2018-01-03 ENCOUNTER — Inpatient Hospital Stay: Payer: BLUE CROSS/BLUE SHIELD

## 2018-01-03 DIAGNOSIS — J9602 Acute respiratory failure with hypercapnia: Secondary | ICD-10-CM

## 2018-01-03 LAB — BASIC METABOLIC PANEL
ANION GAP: 7 (ref 5–15)
Anion gap: 8 (ref 5–15)
BUN: 24 mg/dL — AB (ref 6–20)
BUN: 21 mg/dL — ABNORMAL HIGH (ref 6–20)
CHLORIDE: 111 mmol/L (ref 98–111)
CHLORIDE: 110 mmol/L (ref 98–111)
CO2: 29 mmol/L (ref 22–32)
CO2: 26 mmol/L (ref 22–32)
Calcium: 7.9 mg/dL — ABNORMAL LOW (ref 8.9–10.3)
Calcium: 6.8 mg/dL — ABNORMAL LOW (ref 8.9–10.3)
Creatinine, Ser: 0.98 mg/dL (ref 0.44–1.00)
Creatinine, Ser: 0.78 mg/dL (ref 0.44–1.00)
GFR calc Af Amer: >60 mL/min (ref >60)
GFR calc Af Amer: >60 mL/min (ref >60)
GLUCOSE: 109 mg/dL — AB (ref 70–99)
Glucose, Bld: 101 mg/dL — ABNORMAL HIGH (ref 70–99)
POTASSIUM: 3.5 mmol/L (ref 3.5–5.1)
POTASSIUM: 3 mmol/L — AB (ref 3.5–5.1)
SODIUM: 148 mmol/L — AB (ref 135–145)
SODIUM: 143 mmol/L (ref 135–145)

## 2018-01-03 LAB — CBC
HCT: 38.8 % (ref 36.0–46.0)
Hemoglobin: 11.5 g/dL — ABNORMAL LOW (ref 12.0–15.0)
MCH: 32.8 pg (ref 26.0–34.0)
MCHC: 29.6 g/dL — AB (ref 30.0–36.0)
MCV: 110.5 fL — AB (ref 80.0–100.0)
PLATELETS: 176 10*3/uL (ref 150–400)
RBC: 3.51 MIL/uL — ABNORMAL LOW (ref 3.87–5.11)
RDW: 17.8 % — AB (ref 11.5–15.5)
WBC: 9 10*3/uL (ref 4.0–10.5)
nRBC: 0.3 % — ABNORMAL HIGH (ref 0.0–0.2)

## 2018-01-03 LAB — HEPATIC FUNCTION PANEL
ALBUMIN: 2.4 g/dL — AB (ref 3.5–5.0)
ALBUMIN: 2.2 g/dL — AB (ref 3.5–5.0)
ALK PHOS: 40 U/L (ref 38–126)
ALT: 220 U/L — AB (ref 0–44)
ALT: 195 U/L — ABNORMAL HIGH (ref 0–44)
AST: 449 U/L — ABNORMAL HIGH (ref 15–41)
AST: 322 U/L — ABNORMAL HIGH (ref 15–41)
Alkaline Phosphatase: 38 U/L (ref 38–126)
Bilirubin, Direct: 0.3 mg/dL — ABNORMAL HIGH (ref 0.0–0.2)
Bilirubin, Direct: 0.3 mg/dL — ABNORMAL HIGH (ref 0.0–0.2)
Indirect Bilirubin: 1 mg/dL — ABNORMAL HIGH (ref 0.3–0.9)
Indirect Bilirubin: 0.8 mg/dL (ref 0.3–0.9)
TOTAL PROTEIN: 6.1 g/dL — AB (ref 6.5–8.1)
TOTAL PROTEIN: 5.7 g/dL — AB (ref 6.5–8.1)
Total Bilirubin: 1.3 mg/dL — ABNORMAL HIGH (ref 0.3–1.2)
Total Bilirubin: 1.1 mg/dL (ref 0.3–1.2)

## 2018-01-03 LAB — GLUCOSE, CAPILLARY
GLUCOSE-CAPILLARY: 104 mg/dL — AB (ref 70–99)
GLUCOSE-CAPILLARY: 110 mg/dL — AB (ref 70–99)
GLUCOSE-CAPILLARY: 79 mg/dL (ref 70–99)
GLUCOSE-CAPILLARY: 87 mg/dL (ref 70–99)
Glucose-Capillary: 70 mg/dL (ref 70–99)
Glucose-Capillary: 91 mg/dL (ref 70–99)

## 2018-01-03 LAB — LACTIC ACID, PLASMA: LACTIC ACID, VENOUS: 1.2 mmol/L (ref 0.5–1.9)

## 2018-01-03 LAB — MAGNESIUM
MAGNESIUM: 1.9 mg/dL (ref 1.7–2.4)
Magnesium: 2.1 mg/dL (ref 1.7–2.4)

## 2018-01-03 LAB — PROCALCITONIN: Procalcitonin: 0.87 ng/mL

## 2018-01-03 LAB — CK
Total CK: 7456 U/L — ABNORMAL HIGH (ref 38–234)
Total CK: 4836 U/L — ABNORMAL HIGH (ref 38–234)

## 2018-01-03 LAB — HIV ANTIBODY (ROUTINE TESTING W REFLEX): HIV Screen 4th Generation wRfx: NONREACTIVE

## 2018-01-03 LAB — PHOSPHORUS
PHOSPHORUS: 2 mg/dL — AB (ref 2.5–4.6)
Phosphorus: 2.4 mg/dL — ABNORMAL LOW (ref 2.5–4.6)

## 2018-01-03 LAB — VANCOMYCIN, TROUGH: VANCOMYCIN TR: 22 ug/mL — AB (ref 15–20)

## 2018-01-03 MED ORDER — PRO-STAT SUGAR FREE PO LIQD
60.0000 mL | Freq: Two times a day (BID) | ORAL | Status: DC
  Administered 2018-01-03 – 2018-01-06 (×7): 60 mL

## 2018-01-03 MED ORDER — ADULT MULTIVITAMIN LIQUID CH
15.0000 mL | Freq: Every day | ORAL | Status: DC
  Administered 2018-01-03 – 2018-01-06 (×4): 15 mL
  Filled 2018-01-03 (×8): qty 15

## 2018-01-03 MED ORDER — FAMOTIDINE 20 MG PO TABS
20.0000 mg | ORAL_TABLET | Freq: Two times a day (BID) | ORAL | Status: DC
  Administered 2018-01-03 – 2018-01-09 (×11): 20 mg
  Filled 2018-01-03 (×11): qty 1

## 2018-01-03 MED ORDER — POTASSIUM CHLORIDE 10 MEQ/100ML IV SOLN: 10.0000 meq | INTRAVENOUS | Status: DC

## 2018-01-03 MED ORDER — POTASSIUM PHOSPHATES 15 MMOLE/5ML IV SOLN
30.0000 mmol | Freq: Once | INTRAVENOUS | Status: AC
  Administered 2018-01-03: 30 mmol via INTRAVENOUS
  Filled 2018-01-03 (×4): qty 10

## 2018-01-03 MED ORDER — POTASSIUM CHLORIDE 20 MEQ PO PACK
40.0000 meq | PACK | Freq: Once | ORAL | Status: AC
  Administered 2018-01-03: 40 meq
  Filled 2018-01-03: qty 2

## 2018-01-03 MED ORDER — TOBRAMYCIN 0.3 % OP SOLN
1.0000 [drp] | OPHTHALMIC | Status: DC
  Administered 2018-01-03 – 2018-01-21 (×100): 1 [drp] via OPHTHALMIC
  Filled 2018-01-03 (×3): qty 5

## 2018-01-03 MED ORDER — HYDRALAZINE HCL 20 MG/ML IJ SOLN
10.0000 mg | INTRAMUSCULAR | Status: DC | PRN
  Administered 2018-01-07 – 2018-01-12 (×8): 10 mg via INTRAVENOUS
  Filled 2018-01-03 (×9): qty 1

## 2018-01-03 MED ORDER — VANCOMYCIN HCL 10 G IV SOLR
1500.0000 mg | Freq: Two times a day (BID) | INTRAVENOUS | Status: DC
  Administered 2018-01-03 – 2018-01-05 (×4): 1500 mg via INTRAVENOUS
  Filled 2018-01-03 (×6): qty 1500

## 2018-01-03 NOTE — Progress Notes (Signed)
Pharmacy Antibiotic Note  Stephanie [name] is a 57 y.o. female admitted on 01/01/2018 with wound infection.  Pharmacy has been consulted for Cefepime and vancomycin dosing.  Plan: Continue cefepime 2 gm IV Q12H  Creatinine clearance had improved so previously modified vancomycin to 1.25 gm IV Q8H, predicted trough 16 mcg/ml.   11/4 1849 VT 22  Dosing interval will be increased to q 12hours with a dose increase to 1500mg .  Old was held and new dosing will be resumed at 11/5 0000  VT will be rechecked prior to the fourth dose of the new regimen  Pharmacy will continue to follow and adjust as needed to maintain trough 15 to 20 mcg/ml.   Vd 86.2 L, Ke 0.095 hr-1, T1/2 7.3 hr  Height: 5\' 4"  (162.6 cm) Weight: (!) 516 lb 8 oz (234.3 kg) IBW/kg (Calculated) : 54.7 Adjusted body weight 71.72  Temp (24hrs), Avg:98.7 F (37.1 C), Min:98 F (36.7 C), Max:99.2 F (37.3 C)  Recent Labs  Lab 01/01/18 1649 01/01/18 1859 01/01/18 2221 01/02/18 0526 01/02/18 1810 01/03/18 0437 01/03/18 0438 01/03/18 0957 01/03/18 1849  WBC 10.8*  --   --  13.1*  --   --  9.0  --   --   CREATININE 1.58*  --   --  1.13* 1.01* 0.98  --  0.78  --   LATICACIDVEN  --  2.3* 2.8*  --  1.4 1.2  --   --   --   VANCOTROUGH  --   --   --   --   --   --   --   --  22*    Estimated Creatinine Clearance: 158.7 mL/min (by C-G formula based on SCr of 0.78 mg/dL).    No Known Allergies  Antimicrobials this admission: Cefepime 11/2 >>  Vancomycin 11/2 >>   Dose adjustments this admission: 11/4 Vanc 1250mg  q 8 adjusted to 1500mg  q 12  Microbiology results:  BCx:   UCx:    Sputum:    MRSA PCR: positive  Thank you for allowing pharmacy to be a part of this patient's care.  Lu Duffel, Pharm.D., BCPS Clinical Pharmacist 01/03/2018 7:54 PM

## 2018-01-03 NOTE — Progress Notes (Signed)
Initial Nutrition Assessment  DOCUMENTATION CODES:   Morbid obesity  INTERVENTION:  Provide Vital High Protein at 40 mL/hr (960 mL goal daily volume) + Pro-Stat 60 mL BID per OGT. Provides 1360 kcal, 144 grams of protein, 806 mL H2O daily.  Provide liquid MVI daily per tube.  Provide free water flush 30 mL Q4hrs to maintain tube patency.  NUTRITION DIAGNOSIS:   Inadequate oral intake related to inability to eat as evidenced by NPO status.  GOAL:   Provide needs based on ASPEN/SCCM guidelines  MONITOR:   Vent status, Labs, Weight trends, TF tolerance, Skin, I & O's  REASON FOR ASSESSMENT:   Ventilator    ASSESSMENT:   57 year old female with PMHx of morbid obesity, HTN who is admitted with acute hypercapnic respiratory failure with probable obesity hypoventilation syndrome requiring intubation on 11/3, lower extremity cellulitis.   Patient intubated and sedated. On PS/CPAP mode with FiO2 40% and PEEP 8 cmH2O. No weight history in chart to trend. Patient is currently 234.3 kg (516.5 lbs). She was 225.6 kg (497.36 lbs) on admission.  Enteral Access: OGT placed 11/3; terminates in stomach per abdominal x-ray 11/3; 65 cm at corner of mouth  MAP: 92-145  TF: pt was started on adult TF protocol last night; receiving Vital High Protein at 40 mL/hr and Pro-Stat 30 mL BID  Patient is currently intubated on ventilator support Ve: 11.3 L/min Temp (24hrs), Avg:98.7 F (37.1 C), Min:98 F (36.7 C), Max:99.4 F (37.4 C)  Propofol: N/A  Medications reviewed and include: famotidine, potassium chloride 40 mEq per tube once today, 1/2NS at 150 mL/hr, cefepime, fentanyl gtt, potassium phosphate 30 mmol IV once today, vancomycin.  Labs reviewed: CBG 70-91, Potassium 3, BUN 21, Phosphorus 2.  I/O: 1300 mL UOP yesterday (0.2 mL/kg/hr)  Patient does not meet criteria for malnutrition at this time.  Discussed with RN and on rounds.  NUTRITION - FOCUSED PHYSICAL EXAM:    Most  Recent Value  Orbital Region  No depletion  Upper Arm Region  No depletion  Thoracic and Lumbar Region  No depletion  Buccal Region  No depletion  Temple Region  No depletion  Clavicle Bone Region  No depletion  Clavicle and Acromion Bone Region  No depletion  Scapular Bone Region  No depletion  Dorsal Hand  No depletion  Patellar Region  Unable to assess  Anterior Thigh Region  Unable to assess  Posterior Calf Region  Unable to assess  Edema (RD Assessment)  Moderate  Hair  Reviewed  Eyes  Unable to assess  Mouth  Unable to assess  Skin  Reviewed  Nails  Reviewed     Diet Order:   Diet Order            Diet NPO time specified  Diet effective now             EDUCATION NEEDS:   No education needs have been identified at this time  Skin:  Skin Assessment: Skin Integrity Issues:(venous stasis ulcer right leg; MSAD to abdomen and groin)  Last BM:  Unknown/PTA  Height:   Ht Readings from Last 1 Encounters:  01/01/18 5\' 4"  (1.626 m)   Weight:   Wt Readings from Last 1 Encounters:  01/03/18 (!) 234.3 kg   Ideal Body Weight:  54.5 kg  BMI:  Body mass index is 88.66 kg/m.  Estimated Nutritional Needs:   Kcal:  1199-1363 (22-25 kcal/kg IBW)  Protein:  136 grams (2.5 grams/kg IBW)  Fluid:  1.6 L/day (30 mL/kg IBW)  [name], MS, RD, LDN Office: [phone] Pager: [phone] After Hours/Weekend Pager: [phone]

## 2018-01-03 NOTE — Progress Notes (Signed)
Pharmacy Electrolyte Monitoring Consult:  Pharmacy consulted to assist in monitoring and replacing electrolytes in this 57 y.o. female admitted on 01/01/2018 with Sepsis. Patient has hypercapnic respiratory failure and lower extremity cellulitis.   Labs:  Sodium (mmol/L)  Date Value  01/03/2018 143   Potassium (mmol/L)  Date Value  01/03/2018 3.0 (L)   Magnesium (mg/dL)  Date Value  01/03/2018 1.9   Phosphorus (mg/dL)  Date Value  01/03/2018 2.0 (L)   Calcium (mg/dL)  Date Value  01/03/2018 6.8 (L)   Albumin (g/dL)  Date Value  01/03/2018 2.4 (L)    Assessment/Plan: Will order potassium phosphate 30 mmol IV x 1 and potassium chloride 40 mEq PO x 1. Will hold off magnesium replacement and check with am labs. Will check electrolytes with am labs.   Will replace for goal potassium ~4, goal magnesium ~ 2, and goal phosphorus > 2.5.   Pharmacy will continue to monitor and adjust per consult.   Micheale Schlack L 01/03/2018 4:23 PM

## 2018-01-03 NOTE — Progress Notes (Signed)
[facility] [place] NAME: Stephanie [name]    MR#:  [id number]  DATE OF BIRTH:  [birth year]  SUBJECTIVE:   Patient came into the ER with frequent falls. She was found to have acute rhabdomyolysis. She was lethargic. Patient was a pressure support regulation since morning. Now started getting tired placed back on the ventilator setting per RN. No family in the ICU. REVIEW OF SYSTEMS:   Review of Systems  Unable to perform ROS: Intubated   Tolerating Diet: Tolerating PT:   DRUG ALLERGIES:  No Known Allergies  VITALS:  Blood pressure 134/87, pulse 92, temperature 99.2 F (37.3 C), temperature source Oral, resp. rate 20, height 5\' 4"  (1.626 m), weight (!) 234.3 kg, SpO2 96 %.  PHYSICAL EXAMINATION:   Physical Exam  GENERAL:  57 y.o.-year-old patient lying in the bed with no acute distress. Morbidly obese EYES: Pupils equal, round, reactive to light and accommodation. No scleral icterus. Extraocular muscles intact.  HEENT: Head atraumatic, normocephalic. Oropharynx and nasopharynx clear. Intubated on the ventilator NECK:  Supple, no jugular venous distention. No thyroid enlargement, no tenderness.  LUNGS: Normal breath sounds bilaterally, no wheezing, rales, rhonchi. No use of accessory muscles of respiration.  CARDIOVASCULAR: S1, S2 normal. No murmurs, rubs, or gallops.  ABDOMEN: Soft, nontender, nondistended. Bowel sounds present. No organomegaly or mass.  EXTREMITIES: No cyanosis, clubbing or edema b/l.    NEUROLOGIC: on the ventilator PSYCHIATRIC: on the ventilator SKIN: No obvious rash, lesion, or ulcer.   LABORATORY PANEL:  CBC Recent Labs  Lab 01/03/18 0438  WBC 9.0  HGB 11.5*  HCT 38.8  PLT 176    Chemistries  Recent Labs  Lab 01/03/18 0437 01/03/18 0957  NA 148* 143  K 3.5 3.0*  CL 111 110  CO2 29 26  GLUCOSE 109* 101*  BUN 24* 21*  CREATININE 0.98 0.78  CALCIUM 7.9* 6.8*  MG 2.1 1.9  AST 449*  --    ALT 220*  --   ALKPHOS 40  --   BILITOT 1.3*  --    Cardiac Enzymes Recent Labs  Lab 01/01/18 1649  TROPONINI 0.24*   RADIOLOGY:  Dg Abd 1 View  Result Date: 01/02/2018 CLINICAL DATA:  NG tube placement EXAM: ABDOMEN - 1 VIEW COMPARISON:  None. FINDINGS: Limited exam secondary to body habitus. NG tube appears within stomach IMPRESSION: NG tube within stomach. Electronically Signed   By: Suzy Bouchard M.D.   On: 01/02/2018 12:40   Dg Chest Port 1 View  Result Date: 01/03/2018 CLINICAL DATA:  Sepsis, atelectasis. EXAM: PORTABLE CHEST 1 VIEW COMPARISON:  Portable chest x-ray of January 02, 2018. FINDINGS: The lungs are adequately inflated. The interstitial markings are increased. The pulmonary vascularity is engorged. The cardiac silhouette is enlarged. There is calcification in the wall of the aortic arch. The endotracheal tube tip projects approximately 3.3 cm above the carina. The esophagogastric tube tip is difficult to discern but appears to project below the GE junction. The right internal jugular venous catheter tip projects over the proximal SVC. The observed bony thorax is unremarkable. IMPRESSION: Patchy airspace opacities as well as interstitial prominence bilaterally likely reflects CHF and interstitial edema. One cannot absolutely exclude pneumonia. The endotracheal tube tip is in reasonable position. The esophagogastric tube tip cannot be clearly discerned. Thoracic aortic atherosclerosis. Electronically Signed   By: David  Martinique M.D.   On: 01/03/2018 07:42   Dg Chest Port 1 View  Result Date: 01/02/2018  CLINICAL DATA:  Check central line placement EXAM: PORTABLE CHEST 1 VIEW COMPARISON:  01/02/2018 FINDINGS: Right jugular central line is better appreciated on this exam extending into at least the mid superior vena cava. The more distal aspect is not as well visualized again due to patient's body habitus. Cardiac shadow remains enlarged. Endotracheal tube and nasogastric  catheter are again noted in satisfactory position. Vascular congestion is again noted. No evidence of pneumothorax. IMPRESSION: Right jugular central line extending into the SVC. No pneumothorax is noted. Electronically Signed   By: Inez Catalina M.D.   On: 01/02/2018 19:21   Dg Chest Port 1 View  Result Date: 01/02/2018 CLINICAL DATA:  Check central line placement EXAM: PORTABLE CHEST 1 VIEW COMPARISON:  01/02/2018 FINDINGS: The examination is significantly limited by patient's body habitus. Previously seen central line is faintly visualized although the tip is not discretely seen. Endotracheal tube and nasogastric catheter are again noted. The lungs are again well aerated with mild vascular congestion. No pneumothorax is noted. IMPRESSION: Stable appearance of the chest. The right jugular central line is not well appreciated on this study in part due to patient body habitus as well as motion. Changes of vascular congestion are again seen Electronically Signed   By: Inez Catalina M.D.   On: 01/02/2018 14:54   Dg Chest Port 1 View  Result Date: 01/02/2018 CLINICAL DATA:  Central line placement. EXAM: PORTABLE CHEST 1 VIEW COMPARISON:  Chest x-ray from same day at 11:55 a.m. FINDINGS: Endotracheal tube tip approximately 1.8 cm above the carina. Unchanged enteric tube. Interval placement of a right internal jugular central venous catheter with the tip near the confluence of the internal jugular and brachiocephalic veins. Stable cardiomegaly and mild interstitial edema. Unchanged retrocardiac opacification and probable left pleural effusion. The right lung is clear. No pneumothorax. No acute osseous abnormality. IMPRESSION: 1. Right internal jugular central venous catheter with tip near the confluence of the internal jugular and brachiocephalic veins. Consider advancing or exchanging for a longer catheter. 2. No pneumothorax. 3. Stable congestive heart failure and retrocardiac airspace disease, likely  atelectasis. Electronically Signed   By: Titus Dubin M.D.   On: 01/02/2018 13:27   Dg Chest Port 1 View  Result Date: 01/02/2018 CLINICAL DATA:  Endotracheal tube and OG tube placement. EXAM: PORTABLE CHEST 1 VIEW COMPARISON:  One-view chest x-ray 01/01/2018 FINDINGS: Heart is enlarged. Mild edema has slightly increased. Retrocardiac opacification is present. No significant right-sided effusion is present. The patient has been intubated. The endotracheal tube terminates 2.5 cm above the carina. OG tube courses off the inferior border of the film. IMPRESSION: 1. Intubation. The endotracheal tube terminates 2.5 cm above the carina. 2. Cardiomegaly with increasing interstitial edema and congestive heart failure. 3. Retrocardiac airspace disease likely reflects atelectasis. Electronically Signed   By: San Morelle M.D.   On: 01/02/2018 12:38   Dg Chest Port 1 View  Result Date: 01/01/2018 CLINICAL DATA:  Fall.  Morbid obesity. EXAM: PORTABLE CHEST 1 VIEW COMPARISON:  None. FINDINGS: The study is limited by the low volume portable technique in positioning. There appears to be significant enlarged of the cardiac silhouette which may represent cardiomegaly. No pneumothorax. No nodules or masses. No obvious infiltrates. The left retrocardiac region is not well assessed. IMPRESSION: 1. Enlargement of the cardiac silhouette. The study is limited but no focal infiltrate noted. The left retrocardiac region in particular is not well evaluated. Electronically Signed   By: Dorise Bullion III M.D   On:  01/01/2018 19:07   Dg Tibia/fibula Right Port  Result Date: 01/01/2018 CLINICAL DATA:  Fall. EXAM: PORTABLE RIGHT TIBIA AND FIBULA - 2 VIEW COMPARISON:  None. FINDINGS: The study is limited due to poor penetration due to the patient's body habitus. No definitive fractures noted. IMPRESSION: Limited study due to body habitus.  No obvious fractures. Electronically Signed   By: Dorise Bullion III M.D   On:  01/01/2018 19:08   [place] Ekg Site Rite  Result Date: 01/02/2018 If Site Rite image not attached, placement could not be confirmed due to current cardiac rhythm.  ASSESSMENT AND PLAN:  Mckala Pantaleon  is a 57 y.o. female with a known history of hypertension, morbid obesity presented to the emergency room due to extreme fatigue, falls.  Patient was found to have multiple areas of skin breakdown.  Tachycardia, hypoxic respiratory failure. She has had trouble with a chronic wound on her right leg which has gotten weaker over time.    *Sepsis secondary to right lower extremity cellulitis.   likely has chronic edema secondary to massive obesity/chronic lymphedema. -Will start broad-spectrum antibiotics with vancomycin and cefepime---de-escalate antibiotics when able to. -Blood culture so far negative.  *Acute hypoxic respiratory failure in the setting of sepsis with obesity hypoventilation syndrome -patient was falling asleep and not able to protect her areas. She got intubated and placed on the ventilator -appreciate ICU attending input  *Acute kidney injury secondary to sepsis.  -IV fluids.  Monitor input and output.  Repeat labs in the morning.  *Hypertension.  Blood pressure in the normal range.  Hold medications.  *Elevated troponin due to rhabdomyolysis and demand ischemia No chest pain EKG shows no ST changes  *Acute rhabdomyolysis due to recurrent falls.  - IV fluids.  Repeat CK in the morning.    Case discussed with Care Management/Social Worker. Management plans discussed with the patient, family and they are in agreement.  CODE STATUS: full  DVT Prophylaxis: Lovenox  TOTAL TIME TAKING CARE OF THIS PATIENT: *30* minutes.  >50% time spent on counselling and coordination of care  POSSIBLE D/C IN ?? DAYS, DEPENDING ON CLINICAL CONDITION.  Note: This dictation was prepared with Dragon dictation along with smaller phrase technology. Any transcriptional errors that result  from this process are unintentional.  Fritzi Mandes M.D on 01/03/2018 at 5:50 PM  Between 7am to 6pm - Pager - [phone]  After 6pm go to [email or web address] - password [email or web address]  [facility]  Office  [phone]  CC: Primary care physician; Patient, No Pcp PerPatient ID: Charlise Giovanetti, female   DOB: 1961/01/12, 57 y.o.   MRN: [id number]

## 2018-01-03 NOTE — Progress Notes (Signed)
Follow up - Critical Care Medicine Note  Patient Details:    Stephanie [name] is an 57 y.o. female. with history of morbid obesity hypertension.  Patient presented to the ER with lethargy generalized weakness and frequent falls recently.  She was found to have cellulitis of the right lower extremities with acute kidney injury and rhabdomyolysis.  Patient had deterioration of respiratory status requiring intubation  Lines, Airways, Drains: Airway 8 mm (Active)  Secured at (cm) 25 cm 01/03/2018 11:30 AM  Measured From Lips 01/03/2018 11:30 AM  Secured Location Right 01/03/2018  8:00 AM  Secured By [employer] 01/03/2018 11:30 AM  Tube Holder Repositioned Yes 01/03/2018  2:16 AM  Cuff Pressure (cm H2O) 28 cm H2O 01/03/2018  8:00 AM  Site Condition Dry 01/03/2018 11:30 AM     CVC Triple Lumen 01/02/18 Right Internal jugular 25 cm 3 cm (Active)  Indication for Insertion or Continuance of Line Limited venous access - need for IV therapy >5 days (PICC only) 01/03/2018  8:00 AM  Site Assessment Clean;Dry;Intact 01/03/2018  8:00 AM  Proximal Lumen Status Infusing 01/03/2018  8:00 AM  Medial Lumen Status Saline locked 01/03/2018  8:00 AM  Distal Lumen Status Infusing 01/03/2018  8:00 AM  Dressing Type Transparent;Occlusive 01/03/2018  8:00 AM  Dressing Status Clean;Dry;Intact;Antimicrobial disc in place 01/03/2018  8:00 AM  Line Care Connections checked and tightened 01/03/2018  8:00 AM  Dressing Intervention New dressing;Dressing changed;Antimicrobial disc changed 01/02/2018  7:00 PM  Dressing Change Due 01/09/18 01/03/2018  8:00 AM     NG/OG Tube Orogastric Center mouth Xray Documented cm marking at nare/ corner of mouth 65 cm (Active)  Cm Marking at Nare/Corner of Mouth (if applicable) 65 cm 86/08/6718  8:00 AM  Site Assessment Clean;Dry;Intact 01/03/2018  8:00 AM  Ongoing Placement Verification No change in respiratory status;No change in cm markings or external length of tube from initial  placement;Xray 01/03/2018  8:00 AM  Status Infusing tube feed 01/03/2018  8:00 AM  Amount of suction 100 mmHg 01/03/2018  8:00 AM  Drainage Appearance Brown 01/03/2018  8:00 AM  Intake (mL) 100 mL 01/03/2018 10:00 AM     Urethral Catheter Jobe Marker, RN Double-lumen 16 Fr. (Active)  Indication for Insertion or Continuance of Catheter Unstable critical patients (first 24-48 hours) 01/03/2018  8:00 AM  Site Assessment Intact 01/03/2018  8:00 AM  Catheter Maintenance Bag below level of bladder;Catheter secured;Drainage bag/tubing not touching floor;No dependent loops;Seal intact 01/03/2018  8:00 AM  Collection Container Standard drainage bag 01/03/2018  8:00 AM  Securement Method Securing device (Describe) 01/03/2018  8:00 AM  Urinary Catheter Interventions Unclamped 01/03/2018  8:00 AM  Output (mL) 100 mL 01/03/2018  8:00 AM    Anti-infectives:  Anti-infectives (From admission, onward)   Start     Dose/Rate Route Frequency Ordered Stop   01/02/18 2000  vancomycin (VANCOCIN) 1,250 mg in sodium chloride 0.9 % 250 mL IVPB     1,250 mg 166.7 mL/hr over 90 Minutes Intravenous Every 8 hours 01/02/18 1204     01/02/18 0000  vancomycin (VANCOCIN) 1,750 mg in sodium chloride 0.9 % 500 mL IVPB  Status:  Discontinued     1,750 mg 250 mL/hr over 120 Minutes Intravenous Every 12 hours 01/01/18 2118 01/02/18 1204   01/01/18 2130  vancomycin (VANCOCIN) IVPB 750 mg/150 ml premix     750 mg 150 mL/hr over 60 Minutes Intravenous  Once 01/01/18 2116 01/01/18 2339   01/01/18 1830  ceFEPIme (MAXIPIME) 2  g in sodium chloride 0.9 % 100 mL IVPB     2 g 200 mL/hr over 30 Minutes Intravenous Every 12 hours 01/01/18 1826     01/01/18 1730  vancomycin (VANCOCIN) IVPB 1000 mg/200 mL premix     1,000 mg 200 mL/hr over 60 Minutes Intravenous  Once 01/01/18 1726 01/01/18 1917   01/01/18 1730  cefTRIAXone (ROCEPHIN) 2 g in sodium chloride 0.9 % 100 mL IVPB  Status:  Discontinued     2 g 200 mL/hr over 30 Minutes Intravenous  Every 24 hours 01/01/18 1726 01/01/18 1814      Microbiology: Results for orders placed or performed during the hospital encounter of 01/01/18  Blood Culture (routine x 2)     Status: None (Preliminary result)   Collection Time: 01/01/18  5:09 PM  Result Value Ref Range Status   Specimen Description BLOOD LT [facility]  Final   Special Requests   Final    BOTTLES DRAWN AEROBIC AND ANAEROBIC Blood Culture results may not be optimal due to an excessive volume of blood received in culture bottles   Culture   Final    NO GROWTH 2 DAYS Performed at [facility], [address] [place], [place] [address]    Report Status PENDING  Incomplete  Blood Culture (routine x 2)     Status: None (Preliminary result)   Collection Time: 01/01/18  5:09 PM  Result Value Ref Range Status   Specimen Description BLOOD LT HAND  Final   Special Requests   Final    BOTTLES DRAWN AEROBIC AND ANAEROBIC Blood Culture adequate volume   Culture   Final    NO GROWTH 2 DAYS Performed at [facility], [address], [place], [place] [address]    Report Status PENDING  Incomplete  MRSA PCR Screening     Status: Abnormal   Collection Time: 01/01/18  9:58 PM  Result Value Ref Range Status   MRSA by PCR POSITIVE (A) NEGATIVE Final    Comment:        The GeneXpert MRSA Assay (FDA approved for NASAL specimens only), is one component of a comprehensive MRSA colonization surveillance program. It is not intended to diagnose MRSA infection nor to guide or monitor treatment for MRSA infections. RESULT CALLED TO, READ BACK BY AND VERIFIED WITH: FELICIA PREDUHOMME ON 52/8/41 AT [address]6 [facility] Performed at [facility], [address] [place], [place] [address]   Studies: Dg Abd 1 View  Result Date: 01/02/2018 CLINICAL DATA:  NG tube placement EXAM: ABDOMEN - 1 VIEW COMPARISON:  None. FINDINGS: Limited exam secondary to body habitus. NG tube appears within stomach IMPRESSION: NG tube within  stomach. Electronically Signed   By: Suzy Bouchard M.D.   On: 01/02/2018 12:40   Dg Chest Port 1 View  Result Date: 01/03/2018 CLINICAL DATA:  Sepsis, atelectasis. EXAM: PORTABLE CHEST 1 VIEW COMPARISON:  Portable chest x-ray of January 02, 2018. FINDINGS: The lungs are adequately inflated. The interstitial markings are increased. The pulmonary vascularity is engorged. The cardiac silhouette is enlarged. There is calcification in the wall of the aortic arch. The endotracheal tube tip projects approximately 3.3 cm above the carina. The esophagogastric tube tip is difficult to discern but appears to project below the GE junction. The right internal jugular venous catheter tip projects over the proximal SVC. The observed bony thorax is unremarkable. IMPRESSION: Patchy airspace opacities as well as interstitial prominence bilaterally likely reflects CHF and interstitial edema. One cannot absolutely  exclude pneumonia. The endotracheal tube tip is in reasonable position. The esophagogastric tube tip cannot be clearly discerned. Thoracic aortic atherosclerosis. Electronically Signed   By: David  Martinique M.D.   On: 01/03/2018 07:42   Dg Chest Port 1 View  Result Date: 01/02/2018 CLINICAL DATA:  Check central line placement EXAM: PORTABLE CHEST 1 VIEW COMPARISON:  01/02/2018 FINDINGS: Right jugular central line is better appreciated on this exam extending into at least the mid superior vena cava. The more distal aspect is not as well visualized again due to patient's body habitus. Cardiac shadow remains enlarged. Endotracheal tube and nasogastric catheter are again noted in satisfactory position. Vascular congestion is again noted. No evidence of pneumothorax. IMPRESSION: Right jugular central line extending into the SVC. No pneumothorax is noted. Electronically Signed   By: Inez Catalina M.D.   On: 01/02/2018 19:21   Dg Chest Port 1 View  Result Date: 01/02/2018 CLINICAL DATA:  Check central line placement  EXAM: PORTABLE CHEST 1 VIEW COMPARISON:  01/02/2018 FINDINGS: The examination is significantly limited by patient's body habitus. Previously seen central line is faintly visualized although the tip is not discretely seen. Endotracheal tube and nasogastric catheter are again noted. The lungs are again well aerated with mild vascular congestion. No pneumothorax is noted. IMPRESSION: Stable appearance of the chest. The right jugular central line is not well appreciated on this study in part due to patient body habitus as well as motion. Changes of vascular congestion are again seen Electronically Signed   By: Inez Catalina M.D.   On: 01/02/2018 14:54   Dg Chest Port 1 View  Result Date: 01/02/2018 CLINICAL DATA:  Central line placement. EXAM: PORTABLE CHEST 1 VIEW COMPARISON:  Chest x-ray from same day at 11:55 a.m. FINDINGS: Endotracheal tube tip approximately 1.8 cm above the carina. Unchanged enteric tube. Interval placement of a right internal jugular central venous catheter with the tip near the confluence of the internal jugular and brachiocephalic veins. Stable cardiomegaly and mild interstitial edema. Unchanged retrocardiac opacification and probable left pleural effusion. The right lung is clear. No pneumothorax. No acute osseous abnormality. IMPRESSION: 1. Right internal jugular central venous catheter with tip near the confluence of the internal jugular and brachiocephalic veins. Consider advancing or exchanging for a longer catheter. 2. No pneumothorax. 3. Stable congestive heart failure and retrocardiac airspace disease, likely atelectasis. Electronically Signed   By: Titus Dubin M.D.   On: 01/02/2018 13:27   Dg Chest Port 1 View  Result Date: 01/02/2018 CLINICAL DATA:  Endotracheal tube and OG tube placement. EXAM: PORTABLE CHEST 1 VIEW COMPARISON:  One-view chest x-ray 01/01/2018 FINDINGS: Heart is enlarged. Mild edema has slightly increased. Retrocardiac opacification is present. No  significant right-sided effusion is present. The patient has been intubated. The endotracheal tube terminates 2.5 cm above the carina. OG tube courses off the inferior border of the film. IMPRESSION: 1. Intubation. The endotracheal tube terminates 2.5 cm above the carina. 2. Cardiomegaly with increasing interstitial edema and congestive heart failure. 3. Retrocardiac airspace disease likely reflects atelectasis. Electronically Signed   By: San Morelle M.D.   On: 01/02/2018 12:38   Dg Chest Port 1 View  Result Date: 01/01/2018 CLINICAL DATA:  Fall.  Morbid obesity. EXAM: PORTABLE CHEST 1 VIEW COMPARISON:  None. FINDINGS: The study is limited by the low volume portable technique in positioning. There appears to be significant enlarged of the cardiac silhouette which may represent cardiomegaly. No pneumothorax. No nodules or masses. No obvious infiltrates. The  left retrocardiac region is not well assessed. IMPRESSION: 1. Enlargement of the cardiac silhouette. The study is limited but no focal infiltrate noted. The left retrocardiac region in particular is not well evaluated. Electronically Signed   By: Dorise Bullion III M.D   On: 01/01/2018 19:07   Dg Tibia/fibula Right Port  Result Date: 01/01/2018 CLINICAL DATA:  Fall. EXAM: PORTABLE RIGHT TIBIA AND FIBULA - 2 VIEW COMPARISON:  None. FINDINGS: The study is limited due to poor penetration due to the patient's body habitus. No definitive fractures noted. IMPRESSION: Limited study due to body habitus.  No obvious fractures. Electronically Signed   By: Dorise Bullion III M.D   On: 01/01/2018 19:08   [place] Ekg Site Rite  Result Date: 01/02/2018 If Site Rite image not attached, placement could not be confirmed due to current cardiac rhythm.   Subjective:    Overnight Issues: No significant issues noted overnight, patient is awake alert and responsive on mechanical ventilation  Objective:  Vital signs for last 24 hours: Temp:  [98 F (36.7  C)-99.4 F (37.4 C)] 98.7 F (37.1 C) (11/04 0800) Pulse Rate:  [74-133] 87 (11/04 1100) Resp:  [15-23] 17 (11/04 1100) BP: (88-193)/(61-116) 154/77 (11/04 1100) SpO2:  [93 %-98 %] 97 % (11/04 1130) FiO2 (%):  [40 %] 40 % (11/04 1130) Weight:  [234.3 kg] 234.3 kg (11/04 0345)  Hemodynamic parameters for last 24 hours:    Intake/Output from previous day: 11/03 0701 - 11/04 0700 In: 5688.8 [I.V.:3959.3; NG/GT:360.7; IV Piggyback:1368.8] Out: 1300 [Urine:1300]  Intake/Output this shift: Total I/O In: 767 [I.V.:503.2; NG/GT:220; IV Piggyback:43.8] Out: 100 [Urine:100]  Vent settings for last 24 hours: Vent Mode: PSV FiO2 (%):  [40 %] 40 % Set Rate:  [20 bmp] 20 bmp Vt Set:  [600 mL] 600 mL PEEP:  [5 cmH20-10 cmH20] 8 cmH20 Pressure Support:  [18 cmH20-20 cmH20] 18 cmH20 Plateau Pressure:  [26 cmH20-28 cmH20] 28 cmH20  Physical Exam:  Vital signs: Please see the above listed vital signs HEENT: Patient is orally intubated, no accessory muscle utilization Cardiovascular: Tachycardia regular rate and rhythm Pulmonary: Diminished breath sounds Abdominal: Massive obesity Extremities: Massive lower extremity edema with superimposed cellulitis and chronic venous stasis changes Neurologic: Patient moves all extremities and is responsive  Assessment/Plan:  Acute hypercapnic respiratory failure, with 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 Patient's respiratory status has improved some.  We will begin spontaneous awakening and breathing trials as tolerated  Lower extremity cellulitis.  Patient is on cefepime and vancomycin, most likely chronic lymphedema from massive obesity  Hypokalemia.  Will replace  Po phosphatemia will replace  Transaminitis.  Will follow liver function tests  Critical Care Total Time 35 minutes  Lance Huaracha 01/03/2018  *Care during the described time interval was provided by me and/or other providers on the critical care  team.  I have reviewed this patient's available data, including medical history, events of note, physical examination and test results as part of my evaluation.

## 2018-01-03 NOTE — Progress Notes (Signed)
PHARMACIST - PHYSICIAN COMMUNICATION  CONCERNING: IV to Oral Route Change Policy  RECOMMENDATION: This patient is receiving famotidine by the intravenous route.  Based on criteria approved by the Pharmacy and Therapeutics Committee, the intravenous medication(s) is/are being converted to the equivalent oral dose form(s).   DESCRIPTION: These criteria include:  The patient is eating (either orally or via tube) and/or has been taking other orally administered medications for a least 24 hours  The patient has no evidence of active gastrointestinal bleeding or impaired GI absorption (gastrectomy, short bowel, patient on TNA or NPO).  If you have questions about this conversion, please contact the Pharmacy Department  []   [phone] )  [facility] [x]   [phone] )  [facility] []   [phone] )  [facility] []   [phone] )  [facility] []   [phone] )  [place], [place] 01/03/2018 11:27 AM

## 2018-01-04 ENCOUNTER — Inpatient Hospital Stay: Payer: BLUE CROSS/BLUE SHIELD

## 2018-01-04 LAB — PROCALCITONIN: Procalcitonin: 0.55 ng/mL

## 2018-01-04 LAB — HEPATIC FUNCTION PANEL
ALBUMIN: 2.2 g/dL — AB (ref 3.5–5.0)
ALT: 185 U/L — ABNORMAL HIGH (ref 0–44)
AST: 286 U/L — AB (ref 15–41)
Alkaline Phosphatase: 42 U/L (ref 38–126)
Bilirubin, Direct: 0.3 mg/dL — ABNORMAL HIGH (ref 0.0–0.2)
Indirect Bilirubin: 0.8 mg/dL (ref 0.3–0.9)
TOTAL PROTEIN: 5.9 g/dL — AB (ref 6.5–8.1)
Total Bilirubin: 1.1 mg/dL (ref 0.3–1.2)

## 2018-01-04 LAB — GLUCOSE, CAPILLARY
GLUCOSE-CAPILLARY: 103 mg/dL — AB (ref 70–99)
GLUCOSE-CAPILLARY: 95 mg/dL (ref 70–99)
GLUCOSE-CAPILLARY: 101 mg/dL — AB (ref 70–99)
Glucose-Capillary: 82 mg/dL (ref 70–99)
Glucose-Capillary: 86 mg/dL (ref 70–99)
Glucose-Capillary: 99 mg/dL (ref 70–99)
Glucose-Capillary: 112 mg/dL — ABNORMAL HIGH (ref 70–99)

## 2018-01-04 LAB — BASIC METABOLIC PANEL
Anion gap: 7 (ref 5–15)
BUN: 25 mg/dL — AB (ref 6–20)
CO2: 29 mmol/L (ref 22–32)
CREATININE: 0.86 mg/dL (ref 0.44–1.00)
Calcium: 8 mg/dL — ABNORMAL LOW (ref 8.9–10.3)
Chloride: 113 mmol/L — ABNORMAL HIGH (ref 98–111)
GFR calc Af Amer: >60 mL/min (ref >60)
GFR calc non Af Amer: >60 mL/min (ref >60)
GLUCOSE: 113 mg/dL — AB (ref 70–99)
POTASSIUM: 3.7 mmol/L (ref 3.5–5.1)
Sodium: 149 mmol/L — ABNORMAL HIGH (ref 135–145)

## 2018-01-04 LAB — CK: Total CK: 3914 U/L — ABNORMAL HIGH (ref 38–234)

## 2018-01-04 LAB — MAGNESIUM: Magnesium: 2 mg/dL (ref 1.7–2.4)

## 2018-01-04 LAB — PHOSPHORUS: Phosphorus: 2.4 mg/dL — ABNORMAL LOW (ref 2.5–4.6)

## 2018-01-04 MED ORDER — POTASSIUM CHLORIDE 20 MEQ PO PACK
40.0000 meq | PACK | Freq: Once | ORAL | Status: AC
  Administered 2018-01-04: 40 meq
  Filled 2018-01-04: qty 2

## 2018-01-04 MED ORDER — FREE WATER
100.0000 mL | Status: DC
  Administered 2018-01-04 – 2018-01-07 (×17): 100 mL

## 2018-01-04 NOTE — Progress Notes (Signed)
Pt with multiple denuded areas on her legs, and an area on her left lower ABD of particular concern d/t yellow slough and drainage.  The areas on her legs and thighs are bright pink and clean.  She also has areas on bilateral elbows that appear to be old tears that were fairly deep.  All wounds were inspected and wound RN consulted.  Dressing changes done this shift as Engineer, drilling ordered.  Elbows covered with foam

## 2018-01-04 NOTE — Consult Note (Signed)
[facility] Nurse wound consult note Reason for Consult: Partial thickness areas of skin loss in skin folds, anterior thighs and right foot.  Full thickness tissue loss at left abdomen, etiology not known.  Patient had fallen and was assist to stretcher for transport by and via EMS. Wound type: Moisture, trauma Pressure Injury POA: NA Measurement: Left abdomen full thickness wound:  12cm x 12cm area with depth undetermined, presents as 60% red, 40% white/yellow discoloration over suspected area of greatest damage/depth. Moderate amount of serous to light yellow exudate. Left anterior thigh with 12cm x 12cm x 0.1cm area of partial thickness skin loss, wound bed is red, moist but with scant to small exudate, serous Right anterior foot: two partial thickness areas of injury, one intact fluid filled serous blister (1cm x 2cm) and one ruptured blister (1.5cm x 2.5cm x 0.1). Ruptured blister with red, moist wound bed, small amount exudate. Right thigh: 3cm x 6cm x 0.1cm area of partial thickness skin loss, pink moist wound bed, scant serous exudate. Subpannicular skin fold with maceration, with minute, pinpoint open areas with no exudate. Intertriginous dermatitis. Sacrum, buttocks and heels are intact. Wound bed:As described above Drainage (amount, consistency, odor) As described above Periwound: intact, dry Dressing procedure/placement/frequency: Orders are provided for bilateral pressure redistribution heel boots, patient is lying on a mattress replacement with low air loss feature for bariatric persons. House antimicrobial textile (InterDry Ag+) is provided for intertriginous dermatitis. Topical care orders for calcium alginate dressing, topped with dry gauze and covered with ABD pad and secured with paper tape are provided for the left abdomen and left thigh. A sacral prophylactic dressing is ordered for the intact, but at risk sacral area.  [place] nursing team will not follow, but will remain available to this  patient, the nursing and medical teams.  Please re-consult if needed. Thanks, Maudie Flakes, MSN, RN, [place], Arther Abbott  Pager# [phone]

## 2018-01-04 NOTE — Progress Notes (Signed)
[facility] [place] NAME: Stephanie [name]    MR#:  [id number]  DATE OF BIRTH:  [birth year]  SUBJECTIVE:   Patient came into the ER with frequent falls. She was found to have acute rhabdomyolysis.   Remains critically ill and on the ventilator. REVIEW OF SYSTEMS:   Review of Systems  Unable to perform ROS: Intubated   Tolerating Diet:TF Tolerating PT: intubated  DRUG ALLERGIES:  No Known Allergies  VITALS:  Blood pressure (!) 155/83, pulse 82, temperature 98.8 F (37.1 C), temperature source Axillary, resp. rate 20, height 5\' 4"  (1.626 m), weight (!) 239 kg, SpO2 97 %.  PHYSICAL EXAMINATION:   Physical Exam  GENERAL:  57 y.o.-year-old patient lying in the bed with no acute distress. Morbidly obese EYES: Pupils equal, round, reactive to light and accommodation. No scleral icterus. Extraocular muscles intact.  HEENT: Head atraumatic, normocephalic. Oropharynx and nasopharynx clear. Intubated on the ventilator NECK:  Supple, no jugular venous distention. No thyroid enlargement, no tenderness.  LUNGS: Normal breath sounds bilaterally, no wheezing, rales, rhonchi. No use of accessory muscles of respiration.  CARDIOVASCULAR: S1, S2 normal. No murmurs, rubs, or gallops.  ABDOMEN: Soft, nontender, nondistended. Bowel sounds present. No organomegaly or mass.  EXTREMITIES: bilateral lower extremity lymphedema NEUROLOGIC: on the ventilator PSYCHIATRIC: on the ventilator SKIN: No obvious rash, lesion, or ulcer.   LABORATORY PANEL:  CBC Recent Labs  Lab 01/03/18 0438  WBC 9.0  HGB 11.5*  HCT 38.8  PLT 176    Chemistries  Recent Labs  Lab 01/04/18 0413  NA 149*  K 3.7  CL 113*  CO2 29  GLUCOSE 113*  BUN 25*  CREATININE 0.86  CALCIUM 8.0*  MG 2.0  AST 286*  ALT 185*  ALKPHOS 42  BILITOT 1.1   Cardiac Enzymes Recent Labs  Lab 01/01/18 1649  TROPONINI 0.24*   RADIOLOGY:  Dg Chest Port 1 View  Result Date:  01/04/2018 CLINICAL DATA:  Intubated patient with acute kidney injury, rhabdomyolysis and hypertension. EXAM: PORTABLE CHEST 1 VIEW COMPARISON:  Single-view of the chest 01/03/2018 and 01/02/2018. FINDINGS: The patient's right IJ catheter appears to have backed out with the tip now in the distal right internal jugular vein near its confluence with the right subclavian vein. However, visualization could be limited. Endotracheal tube and NG tube are again seen. There is cardiomegaly and vascular congestion. Mild bibasilar atelectasis is noted. IMPRESSION: Right IJ catheter appears to have backed out with its tip now projecting in the distal right internal jugular vein. Visualization may be limited on this portable examination given the patient's habitus. Cardiomegaly and pulmonary vascular congestion. Mild bibasilar airspace disease, likely atelectasis. Electronically Signed   By: Inge Rise M.D.   On: 01/04/2018 11:20   Dg Chest Port 1 View  Result Date: 01/03/2018 CLINICAL DATA:  Sepsis, atelectasis. EXAM: PORTABLE CHEST 1 VIEW COMPARISON:  Portable chest x-ray of January 02, 2018. FINDINGS: The lungs are adequately inflated. The interstitial markings are increased. The pulmonary vascularity is engorged. The cardiac silhouette is enlarged. There is calcification in the wall of the aortic arch. The endotracheal tube tip projects approximately 3.3 cm above the carina. The esophagogastric tube tip is difficult to discern but appears to project below the GE junction. The right internal jugular venous catheter tip projects over the proximal SVC. The observed bony thorax is unremarkable. IMPRESSION: Patchy airspace opacities as well as interstitial prominence bilaterally likely reflects CHF and interstitial edema. One  cannot absolutely exclude pneumonia. The endotracheal tube tip is in reasonable position. The esophagogastric tube tip cannot be clearly discerned. Thoracic aortic atherosclerosis. Electronically  Signed   By: David  Martinique M.D.   On: 01/03/2018 07:42   Dg Chest Port 1 View  Result Date: 01/02/2018 CLINICAL DATA:  Check central line placement EXAM: PORTABLE CHEST 1 VIEW COMPARISON:  01/02/2018 FINDINGS: Right jugular central line is better appreciated on this exam extending into at least the mid superior vena cava. The more distal aspect is not as well visualized again due to patient's body habitus. Cardiac shadow remains enlarged. Endotracheal tube and nasogastric catheter are again noted in satisfactory position. Vascular congestion is again noted. No evidence of pneumothorax. IMPRESSION: Right jugular central line extending into the SVC. No pneumothorax is noted. Electronically Signed   By: Inez Catalina M.D.   On: 01/02/2018 19:21   Dg Chest Port 1 View  Result Date: 01/02/2018 CLINICAL DATA:  Check central line placement EXAM: PORTABLE CHEST 1 VIEW COMPARISON:  01/02/2018 FINDINGS: The examination is significantly limited by patient's body habitus. Previously seen central line is faintly visualized although the tip is not discretely seen. Endotracheal tube and nasogastric catheter are again noted. The lungs are again well aerated with mild vascular congestion. No pneumothorax is noted. IMPRESSION: Stable appearance of the chest. The right jugular central line is not well appreciated on this study in part due to patient body habitus as well as motion. Changes of vascular congestion are again seen Electronically Signed   By: Inez Catalina M.D.   On: 01/02/2018 14:54   Dg Chest Port 1 View  Result Date: 01/02/2018 CLINICAL DATA:  Central line placement. EXAM: PORTABLE CHEST 1 VIEW COMPARISON:  Chest x-ray from same day at 11:55 a.m. FINDINGS: Endotracheal tube tip approximately 1.8 cm above the carina. Unchanged enteric tube. Interval placement of a right internal jugular central venous catheter with the tip near the confluence of the internal jugular and brachiocephalic veins. Stable cardiomegaly  and mild interstitial edema. Unchanged retrocardiac opacification and probable left pleural effusion. The right lung is clear. No pneumothorax. No acute osseous abnormality. IMPRESSION: 1. Right internal jugular central venous catheter with tip near the confluence of the internal jugular and brachiocephalic veins. Consider advancing or exchanging for a longer catheter. 2. No pneumothorax. 3. Stable congestive heart failure and retrocardiac airspace disease, likely atelectasis. Electronically Signed   By: Titus Dubin M.D.   On: 01/02/2018 13:27   ASSESSMENT AND PLAN:  Stephanie [name]  is a 57 y.o. female with a known history of hypertension, morbid obesity presented to the emergency room due to extreme fatigue, falls.  Patient was found to have multiple areas of skin breakdown.  Tachycardia, hypoxic respiratory failure. She has had trouble with a chronic wound on her right leg which has gotten weaker over time.    *Sepsis secondary to right lower extremity cellulitis.   likely has chronic edema secondary to massive obesity/chronic lymphedema. -On broad-spectrum antibiotics with vancomycin and cefepime---de-escalate antibiotics when able to. -Blood culture so far negative. -MRSA PCR positive  *Acute hypoxic respiratory failure in the setting of sepsis with obesity hypoventilation syndrome -patient was falling asleep and not able to protect her areas. She got intubated and placed on the ventilator -appreciate ICU attending input  *Acute kidney injury secondary to sepsis.  -IV fluids.  Monitor input and output.   -creat better Elevated sodium   *Hypertension.  Blood pressure in the normal range.  Hold medications.  *  Elevated troponin due to rhabdomyolysis and demand ischemia No chest pain EKG shows no ST changes  *Acute rhabdomyolysis due to recurrent falls.  - IV fluids.   -CPK trending down   Case discussed with Care Management/Social Worker. Management plans discussed with the  patient, family and they are in agreement.  CODE STATUS: full  DVT Prophylaxis: Lovenox  TOTAL TIME TAKING CARE OF THIS PATIENT: *30* minutes.  >50% time spent on counselling and coordination of care  POSSIBLE D/C IN ?? DAYS, DEPENDING ON CLINICAL CONDITION.  Note: This dictation was prepared with Dragon dictation along with smaller phrase technology. Any transcriptional errors that result from this process are unintentional.  Fritzi Mandes M.D on 01/04/2018 at 12:19 PM  Between 7am to 6pm - Pager - [phone]  After 6pm go to [email or web address] - password [email or web address]  [facility]  Office  [phone]  CC: Primary care physician; Patient, No Pcp PerPatient ID: Stephanie [name], female   DOB: Oct 16, 1960, 57 y.o.   MRN: [id number]

## 2018-01-04 NOTE — Progress Notes (Signed)
Follow up - Critical Care Medicine Note  Patient Details:    Stephanie [name] is an 57 y.o. female. with history of morbid obesity hypertension.  Patient presented to the ER with lethargy generalized weakness and frequent falls recently.  She was found to have cellulitis of the right lower extremities with acute kidney injury and rhabdomyolysis.  Patient had deterioration of respiratory status requiring intubation  Lines, Airways, Drains: Airway 8 mm (Active)  Secured at (cm) 25 cm 01/03/2018 11:30 AM  Measured From Lips 01/03/2018 11:30 AM  Secured Location Right 01/03/2018  8:00 AM  Secured By [employer] 01/03/2018 11:30 AM  Tube Holder Repositioned Yes 01/03/2018  2:16 AM  Cuff Pressure (cm H2O) 28 cm H2O 01/03/2018  8:00 AM  Site Condition Dry 01/03/2018 11:30 AM     CVC Triple Lumen 01/02/18 Right Internal jugular 25 cm 3 cm (Active)  Indication for Insertion or Continuance of Line Limited venous access - need for IV therapy >5 days (PICC only) 01/03/2018  8:00 AM  Site Assessment Clean;Dry;Intact 01/03/2018  8:00 AM  Proximal Lumen Status Infusing 01/03/2018  8:00 AM  Medial Lumen Status Saline locked 01/03/2018  8:00 AM  Distal Lumen Status Infusing 01/03/2018  8:00 AM  Dressing Type Transparent;Occlusive 01/03/2018  8:00 AM  Dressing Status Clean;Dry;Intact;Antimicrobial disc in place 01/03/2018  8:00 AM  Line Care Connections checked and tightened 01/03/2018  8:00 AM  Dressing Intervention New dressing;Dressing changed;Antimicrobial disc changed 01/02/2018  7:00 PM  Dressing Change Due 01/09/18 01/03/2018  8:00 AM     NG/OG Tube Orogastric Center mouth Xray Documented cm marking at nare/ corner of mouth 65 cm (Active)  Cm Marking at Nare/Corner of Mouth (if applicable) 65 cm 82/06/537  8:00 AM  Site Assessment Clean;Dry;Intact 01/03/2018  8:00 AM  Ongoing Placement Verification No change in respiratory status;No change in cm markings or external length of tube from initial  placement;Xray 01/03/2018  8:00 AM  Status Infusing tube feed 01/03/2018  8:00 AM  Amount of suction 100 mmHg 01/03/2018  8:00 AM  Drainage Appearance Brown 01/03/2018  8:00 AM  Intake (mL) 100 mL 01/03/2018 10:00 AM     Urethral Catheter Jobe Marker, RN Double-lumen 16 Fr. (Active)  Indication for Insertion or Continuance of Catheter Unstable critical patients (first 24-48 hours) 01/03/2018  8:00 AM  Site Assessment Intact 01/03/2018  8:00 AM  Catheter Maintenance Bag below level of bladder;Catheter secured;Drainage bag/tubing not touching floor;No dependent loops;Seal intact 01/03/2018  8:00 AM  Collection Container Standard drainage bag 01/03/2018  8:00 AM  Securement Method Securing device (Describe) 01/03/2018  8:00 AM  Urinary Catheter Interventions Unclamped 01/03/2018  8:00 AM  Output (mL) 100 mL 01/03/2018  8:00 AM    Anti-infectives:  Anti-infectives (From admission, onward)   Start     Dose/Rate Route Frequency Ordered Stop   01/04/18 0000  vancomycin (VANCOCIN) 1,500 mg in sodium chloride 0.9 % 500 mL IVPB     1,500 mg 250 mL/hr over 120 Minutes Intravenous Every 12 hours 01/03/18 2021     01/02/18 2000  vancomycin (VANCOCIN) 1,250 mg in sodium chloride 0.9 % 250 mL IVPB  Status:  Discontinued     1,250 mg 166.7 mL/hr over 90 Minutes Intravenous Every 8 hours 01/02/18 1204 01/03/18 2021   01/02/18 0000  vancomycin (VANCOCIN) 1,750 mg in sodium chloride 0.9 % 500 mL IVPB  Status:  Discontinued     1,750 mg 250 mL/hr over 120 Minutes Intravenous Every 12 hours 01/01/18 2118  01/02/18 1204   01/01/18 2130  vancomycin (VANCOCIN) IVPB 750 mg/150 ml premix     750 mg 150 mL/hr over 60 Minutes Intravenous  Once 01/01/18 2116 01/01/18 2339   01/01/18 1830  ceFEPIme (MAXIPIME) 2 g in sodium chloride 0.9 % 100 mL IVPB     2 g 200 mL/hr over 30 Minutes Intravenous Every 12 hours 01/01/18 1826     01/01/18 1730  vancomycin (VANCOCIN) IVPB 1000 mg/200 mL premix     1,000 mg 200 mL/hr over 60  Minutes Intravenous  Once 01/01/18 1726 01/01/18 1917   01/01/18 1730  cefTRIAXone (ROCEPHIN) 2 g in sodium chloride 0.9 % 100 mL IVPB  Status:  Discontinued     2 g 200 mL/hr over 30 Minutes Intravenous Every 24 hours 01/01/18 1726 01/01/18 1814      Microbiology: Results for orders placed or performed during the hospital encounter of 01/01/18  Blood Culture (routine x 2)     Status: None (Preliminary result)   Collection Time: 01/01/18  5:09 PM  Result Value Ref Range Status   Specimen Description BLOOD LT [facility]  Final   Special Requests   Final    BOTTLES DRAWN AEROBIC AND ANAEROBIC Blood Culture results may not be optimal due to an excessive volume of blood received in culture bottles   Culture   Final    NO GROWTH 3 DAYS Performed at [facility], [address] [place], [place] [address]    Report Status PENDING  Incomplete  Blood Culture (routine x 2)     Status: None (Preliminary result)   Collection Time: 01/01/18  5:09 PM  Result Value Ref Range Status   Specimen Description BLOOD LT HAND  Final   Special Requests   Final    BOTTLES DRAWN AEROBIC AND ANAEROBIC Blood Culture adequate volume   Culture   Final    NO GROWTH 3 DAYS Performed at [facility], [address] [place], [place] [address]    Report Status PENDING  Incomplete  MRSA PCR Screening     Status: Abnormal   Collection Time: 01/01/18  9:58 PM  Result Value Ref Range Status   MRSA by PCR POSITIVE (A) NEGATIVE Final    Comment:        The GeneXpert MRSA Assay (FDA approved for NASAL specimens only), is one component of a comprehensive MRSA colonization surveillance program. It is not intended to diagnose MRSA infection nor to guide or monitor treatment for MRSA infections. RESULT CALLED TO, READ BACK BY AND VERIFIED WITH: FELICIA PREDUHOMME ON 64/4/03 AT 2356 [facility] Performed at [facility], [address] [place], [place] [address]   Studies: Dg Abd 1 View  Result  Date: 01/02/2018 CLINICAL DATA:  NG tube placement EXAM: ABDOMEN - 1 VIEW COMPARISON:  None. FINDINGS: Limited exam secondary to body habitus. NG tube appears within stomach IMPRESSION: NG tube within stomach. Electronically Signed   By: Suzy Bouchard M.D.   On: 01/02/2018 12:40   Dg Chest Port 1 View  Result Date: 01/03/2018 CLINICAL DATA:  Sepsis, atelectasis. EXAM: PORTABLE CHEST 1 VIEW COMPARISON:  Portable chest x-ray of January 02, 2018. FINDINGS: The lungs are adequately inflated. The interstitial markings are increased. The pulmonary vascularity is engorged. The cardiac silhouette is enlarged. There is calcification in the wall of the aortic arch. The endotracheal tube tip projects approximately 3.3 cm above the carina. The esophagogastric tube tip is difficult to discern but appears to project below  the GE junction. The right internal jugular venous catheter tip projects over the proximal SVC. The observed bony thorax is unremarkable. IMPRESSION: Patchy airspace opacities as well as interstitial prominence bilaterally likely reflects CHF and interstitial edema. One cannot absolutely exclude pneumonia. The endotracheal tube tip is in reasonable position. The esophagogastric tube tip cannot be clearly discerned. Thoracic aortic atherosclerosis. Electronically Signed   By: David  Martinique M.D.   On: 01/03/2018 07:42   Dg Chest Port 1 View  Result Date: 01/02/2018 CLINICAL DATA:  Check central line placement EXAM: PORTABLE CHEST 1 VIEW COMPARISON:  01/02/2018 FINDINGS: Right jugular central line is better appreciated on this exam extending into at least the mid superior vena cava. The more distal aspect is not as well visualized again due to patient's body habitus. Cardiac shadow remains enlarged. Endotracheal tube and nasogastric catheter are again noted in satisfactory position. Vascular congestion is again noted. No evidence of pneumothorax. IMPRESSION: Right jugular central line extending into the  SVC. No pneumothorax is noted. Electronically Signed   By: Inez Catalina M.D.   On: 01/02/2018 19:21   Dg Chest Port 1 View  Result Date: 01/02/2018 CLINICAL DATA:  Check central line placement EXAM: PORTABLE CHEST 1 VIEW COMPARISON:  01/02/2018 FINDINGS: The examination is significantly limited by patient's body habitus. Previously seen central line is faintly visualized although the tip is not discretely seen. Endotracheal tube and nasogastric catheter are again noted. The lungs are again well aerated with mild vascular congestion. No pneumothorax is noted. IMPRESSION: Stable appearance of the chest. The right jugular central line is not well appreciated on this study in part due to patient body habitus as well as motion. Changes of vascular congestion are again seen Electronically Signed   By: Inez Catalina M.D.   On: 01/02/2018 14:54   Dg Chest Port 1 View  Result Date: 01/02/2018 CLINICAL DATA:  Central line placement. EXAM: PORTABLE CHEST 1 VIEW COMPARISON:  Chest x-ray from same day at 11:55 a.m. FINDINGS: Endotracheal tube tip approximately 1.8 cm above the carina. Unchanged enteric tube. Interval placement of a right internal jugular central venous catheter with the tip near the confluence of the internal jugular and brachiocephalic veins. Stable cardiomegaly and mild interstitial edema. Unchanged retrocardiac opacification and probable left pleural effusion. The right lung is clear. No pneumothorax. No acute osseous abnormality. IMPRESSION: 1. Right internal jugular central venous catheter with tip near the confluence of the internal jugular and brachiocephalic veins. Consider advancing or exchanging for a longer catheter. 2. No pneumothorax. 3. Stable congestive heart failure and retrocardiac airspace disease, likely atelectasis. Electronically Signed   By: Titus Dubin M.D.   On: 01/02/2018 13:27   Dg Chest Port 1 View  Result Date: 01/02/2018 CLINICAL DATA:  Endotracheal tube and OG tube  placement. EXAM: PORTABLE CHEST 1 VIEW COMPARISON:  One-view chest x-ray 01/01/2018 FINDINGS: Heart is enlarged. Mild edema has slightly increased. Retrocardiac opacification is present. No significant right-sided effusion is present. The patient has been intubated. The endotracheal tube terminates 2.5 cm above the carina. OG tube courses off the inferior border of the film. IMPRESSION: 1. Intubation. The endotracheal tube terminates 2.5 cm above the carina. 2. Cardiomegaly with increasing interstitial edema and congestive heart failure. 3. Retrocardiac airspace disease likely reflects atelectasis. Electronically Signed   By: San Morelle M.D.   On: 01/02/2018 12:38   Dg Chest Port 1 View  Result Date: 01/01/2018 CLINICAL DATA:  Fall.  Morbid obesity. EXAM: PORTABLE CHEST 1 VIEW  COMPARISON:  None. FINDINGS: The study is limited by the low volume portable technique in positioning. There appears to be significant enlarged of the cardiac silhouette which may represent cardiomegaly. No pneumothorax. No nodules or masses. No obvious infiltrates. The left retrocardiac region is not well assessed. IMPRESSION: 1. Enlargement of the cardiac silhouette. The study is limited but no focal infiltrate noted. The left retrocardiac region in particular is not well evaluated. Electronically Signed   By: Dorise Bullion III M.D   On: 01/01/2018 19:07   Dg Tibia/fibula Right Port  Result Date: 01/01/2018 CLINICAL DATA:  Fall. EXAM: PORTABLE RIGHT TIBIA AND FIBULA - 2 VIEW COMPARISON:  None. FINDINGS: The study is limited due to poor penetration due to the patient's body habitus. No definitive fractures noted. IMPRESSION: Limited study due to body habitus.  No obvious fractures. Electronically Signed   By: Dorise Bullion III M.D   On: 01/01/2018 19:08   [place] Ekg Site Rite  Result Date: 01/02/2018 If Site Rite image not attached, placement could not be confirmed due to current cardiac rhythm.   Subjective:     Overnight Issues: No significant issues noted overnight, patient failed spontaneous awakening and breathing trials.  Also noted to have eye infection started on ophthalmic antibiotics  Objective:  Vital signs for last 24 hours: Temp:  [98.4 F (36.9 C)-99.2 F (37.3 C)] 98.8 F (37.1 C) (11/05 0800) Pulse Rate:  [81-102] 82 (11/05 0900) Resp:  [15-32] 20 (11/05 0900) BP: (114-160)/(60-108) 155/83 (11/05 0900) SpO2:  [95 %-98 %] 97 % (11/05 0900) FiO2 (%):  [35 %-40 %] 35 % (11/05 0800) Weight:  [239 kg] 239 kg (11/05 0413)  Hemodynamic parameters for last 24 hours:    Intake/Output from previous day: 11/04 0701 - 11/05 0700 In: 6717.1 [I.V.:3589.5; NG/GT:1280; IV Piggyback:1512.7] Out: 2220 [Urine:2220]  Intake/Output this shift: Total I/O In: 294.9 [I.V.:149.9; NG/GT:145] Out: 575 [Urine:575]  Vent settings for last 24 hours: Vent Mode: PRVC FiO2 (%):  [35 %-40 %] 35 % Set Rate:  [20 bmp] 20 bmp Vt Set:  [600 mL] 600 mL PEEP:  [5 cmH20-8 cmH20] 5 cmH20 Pressure Support:  [18 cmH20] 18 cmH20 Plateau Pressure:  [26 cmH20-30 cmH20] 29 cmH20  Physical Exam:  Vital signs: Please see the above listed vital signs HEENT: Patient is orally intubated, no accessory muscle utilization Cardiovascular: Tachycardia regular rate and rhythm Pulmonary: Diminished breath sounds Abdominal: Massive obesity Extremities: Massive lower extremity edema with superimposed cellulitis and chronic venous stasis changes Neurologic: Patient moves all extremities and is responsive  Assessment/Plan:  Acute hypercapnic respiratory failure, with 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 Patient's respiratory status has improved some.  We will begin spontaneous awakening and breathing trials as tolerated  Lower extremity cellulitis.  Patient is on cefepime and vancomycin, most likely chronic lymphedema from massive obesity  Transaminitis.  Will follow liver function  tests  Conjunctivitis. On Tobrex  Manny Vitolo 01/04/2018  *Care during the described time interval was provided by me and/or other providers on the critical care team.  I have reviewed this patient's available data, including medical history, events of note, physical examination and test results as part of my evaluation. Patient ID: Kymoni Lesperance, female   DOB: Jun 19, 1960, 57 y.o.   MRN: [id number]

## 2018-01-04 NOTE — Progress Notes (Signed)
Pharmacy Electrolyte Monitoring Consult:  Pharmacy consulted to assist in monitoring and replacing electrolytes in this 57 y.o. female admitted on 01/01/2018 with Sepsis. Patient has hypercapnic respiratory failure and lower extremity cellulitis.   Labs:  Sodium (mmol/L)  Date Value  01/04/2018 149 (H)   Potassium (mmol/L)  Date Value  01/04/2018 3.7   Magnesium (mg/dL)  Date Value  01/04/2018 2.0   Phosphorus (mg/dL)  Date Value  01/04/2018 2.4 (L)   Calcium (mg/dL)  Date Value  01/04/2018 8.0 (L)   Albumin (g/dL)  Date Value  01/04/2018 2.2 (L)    Assessment/Plan: Electrolytes: Will replace for goal potassium ~4, goal magnesium ~ 2, and goal phosphorus > 2.5.   Ordered KCl 40 mEq VT x 1.  Will order electrolytes with AM labs.  Glucose:  Patient is not diabetic. BG for last 24 hours 87-113.   Constipation: No recorded BM. Patient has prn Miralax orders. Patient is intubated and NPO. Will reassess 11/6.  Pharmacy will continue to monitor and adjust per consult.    Stephanie [name], PharmD Pharmacy Resident  01/04/2018 4:38 PM

## 2018-01-04 NOTE — Progress Notes (Signed)
Pharmacy Antibiotic Note  Stephanie [name] is a 57 y.o. female admitted on 01/01/2018 with right lower extremity cellulitis, AKI/rhabdomyolysis. Pharmacy has been consulted for Cefepime and vancomycin dosing.  Plan: Continue cefepime 2 gm IV Q12H  11/4 1849 VT 22 Continue Vancomycin 1500 mg IV q12h per CCM discussion per severity of infection.  Pharmacy will continue to follow and adjust as needed to maintain trough 10-15 mcg/ml. Will check trough 11/6 @ 1130.  Vancomycin Kinetics Using adjusted BW 128.4 kg, CrCl 100 mL/min Vd 89.9L ke 0.087 T1/2 9 hrs  Height: 5\' 4"  (162.6 cm) Weight: (!) 527 lb (239 kg) IBW/kg (Calculated) : 54.7 Adjusted body weight 71.72  Temp (24hrs), Avg:98.7 F (37.1 C), Min:98.4 F (36.9 C), Max:99.1 F (37.3 C)  Recent Labs  Lab 01/01/18 1649 01/01/18 1859 01/01/18 2221 01/02/18 0526 01/02/18 1810 01/03/18 0437 01/03/18 0438 01/03/18 0957 01/03/18 1849 01/04/18 0413  WBC 10.8*  --   --  13.1*  --   --  9.0  --   --   --   CREATININE 1.58*  --   --  1.13* 1.01* 0.98  --  0.78  --  0.86  LATICACIDVEN  --  2.3* 2.8*  --  1.4 1.2  --   --   --   --   VANCOTROUGH  --   --   --   --   --   --   --   --  22*  --     Estimated Creatinine Clearance: 149.8 mL/min (by C-G formula based on SCr of 0.86 mg/dL).    No Known Allergies  Antimicrobials this admission: Cefepime 11/2 >>  Vancomycin 11/2 >>   Dose adjustments this admission: 11/4 Vanc 1250mg  q 8 adjusted to 1500mg  q 12  Microbiology results: 11/2 BCx: NG x 3 days  11/2 MRSA PCR: positive  Thank you for allowing pharmacy to be a part of this patient's care.   Paticia Stack, PharmD Pharmacy Resident  01/04/2018 4:28 PM

## 2018-01-05 ENCOUNTER — Inpatient Hospital Stay: Payer: Self-pay

## 2018-01-05 ENCOUNTER — Inpatient Hospital Stay: Payer: BLUE CROSS/BLUE SHIELD

## 2018-01-05 LAB — CBC
HEMATOCRIT: 38.6 % (ref 36.0–46.0)
Hemoglobin: 11.4 g/dL — ABNORMAL LOW (ref 12.0–15.0)
MCH: 32.9 pg (ref 26.0–34.0)
MCHC: 29.5 g/dL — AB (ref 30.0–36.0)
MCV: 111.2 fL — AB (ref 80.0–100.0)
NRBC: 0 % (ref 0.0–0.2)
PLATELETS: 203 10*3/uL (ref 150–400)
RBC: 3.47 MIL/uL — ABNORMAL LOW (ref 3.87–5.11)
RDW: 17.7 % — ABNORMAL HIGH (ref 11.5–15.5)
WBC: 8.7 10*3/uL (ref 4.0–10.5)

## 2018-01-05 LAB — GLUCOSE, CAPILLARY
GLUCOSE-CAPILLARY: 83 mg/dL (ref 70–99)
Glucose-Capillary: 87 mg/dL (ref 70–99)
Glucose-Capillary: 87 mg/dL (ref 70–99)
Glucose-Capillary: 89 mg/dL (ref 70–99)
Glucose-Capillary: 89 mg/dL (ref 70–99)
Glucose-Capillary: 90 mg/dL (ref 70–99)

## 2018-01-05 LAB — MAGNESIUM: MAGNESIUM: 2.1 mg/dL (ref 1.7–2.4)

## 2018-01-05 LAB — BASIC METABOLIC PANEL
ANION GAP: 4 — AB (ref 5–15)
ANION GAP: 0 — AB (ref 5–15)
BUN: 28 mg/dL — AB (ref 6–20)
BUN: 26 mg/dL — AB (ref 6–20)
CALCIUM: 6.9 mg/dL — AB (ref 8.9–10.3)
CHLORIDE: 115 mmol/L — AB (ref 98–111)
CO2: 31 mmol/L (ref 22–32)
CO2: 26 mmol/L (ref 22–32)
Calcium: 8.2 mg/dL — ABNORMAL LOW (ref 8.9–10.3)
Chloride: 125 mmol/L — ABNORMAL HIGH (ref 98–111)
Creatinine, Ser: 0.86 mg/dL (ref 0.44–1.00)
Creatinine, Ser: 0.89 mg/dL (ref 0.44–1.00)
GFR calc Af Amer: >60 mL/min (ref >60)
GFR calc non Af Amer: >60 mL/min (ref >60)
GFR calc non Af Amer: >60 mL/min (ref >60)
GLUCOSE: 97 mg/dL (ref 70–99)
Glucose, Bld: 110 mg/dL — ABNORMAL HIGH (ref 70–99)
Potassium: 3.8 mmol/L (ref 3.5–5.1)
Potassium: 3 mmol/L — ABNORMAL LOW (ref 3.5–5.1)
SODIUM: 150 mmol/L — AB (ref 135–145)
Sodium: 146 mmol/L — ABNORMAL HIGH (ref 135–145)

## 2018-01-05 LAB — BLOOD GAS, ARTERIAL
ACID-BASE EXCESS: 2.1 mmol/L — AB (ref 0.0–2.0)
BICARBONATE: 31 mmol/L — AB (ref 20.0–28.0)
FIO2: 0.35
MODE: POSITIVE
O2 SAT: 92.8 %
PATIENT TEMPERATURE: 37
PCO2 ART: 69 mmHg — AB (ref 32.0–48.0)
PEEP: 5 cmH2O
PH ART: 7.26 — AB (ref 7.350–7.450)
PO2 ART: 76 mmHg — AB (ref 83.0–108.0)
Pressure support: 10 cmH2O

## 2018-01-05 LAB — SODIUM: Sodium: 149 mmol/L — ABNORMAL HIGH (ref 135–145)

## 2018-01-05 LAB — PHOSPHORUS: Phosphorus: 2.5 mg/dL (ref 2.5–4.6)

## 2018-01-05 LAB — VANCOMYCIN, TROUGH: Vancomycin Tr: 23 ug/mL (ref 15–20)

## 2018-01-05 MED ORDER — POLYETHYLENE GLYCOL 3350 17 G PO PACK
17.0000 g | PACK | Freq: Every day | ORAL | Status: DC
  Administered 2018-01-05 – 2018-01-09 (×4): 17 g
  Filled 2018-01-05 (×3): qty 1

## 2018-01-05 MED ORDER — POTASSIUM CHLORIDE 10 MEQ/100ML IV SOLN
10.0000 meq | INTRAVENOUS | Status: AC
  Administered 2018-01-05 – 2018-01-06 (×4): 10 meq via INTRAVENOUS
  Filled 2018-01-05 (×4): qty 100

## 2018-01-05 MED ORDER — METOPROLOL TARTRATE 5 MG/5ML IV SOLN
2.5000 mg | INTRAVENOUS | Status: DC | PRN
  Administered 2018-01-05 (×2): 2.5 mg via INTRAVENOUS
  Administered 2018-01-06: 5 mg via INTRAVENOUS
  Filled 2018-01-05 (×3): qty 5

## 2018-01-05 MED ORDER — VANCOMYCIN HCL 10 G IV SOLR
1250.0000 mg | Freq: Two times a day (BID) | INTRAVENOUS | Status: AC
  Administered 2018-01-06 – 2018-01-07 (×4): 1250 mg via INTRAVENOUS
  Filled 2018-01-05 (×5): qty 1250

## 2018-01-05 MED ORDER — DEXTROSE 5 % IV SOLN
INTRAVENOUS | Status: DC
  Administered 2018-01-05 – 2018-01-08 (×5): via INTRAVENOUS

## 2018-01-05 NOTE — Progress Notes (Signed)
Pts brother wanted to clarify pts code status.  Pt is currently awake and able to nod her head yes and no appropriately when asked questions.  I explained in detail the difference between Full Code vs. DO NOT RESUSCITATE code status to the pt and pts brother.  The pt indicated to me that she wanted to remain a DO NOT RESUSCITATE.  Marda Stalker, [place] Pager [phone] (please enter 7 digits) PCCM Consult Pager [phone] (please enter 7 digits)

## 2018-01-05 NOTE — Progress Notes (Signed)
Pharmacy Antibiotic Note  Stephanie [name] is a 57 y.o. female admitted on 01/01/2018 with right lower extremity cellulitis, AKI/rhabdomyolysis. Pharmacy has been consulted for Cefepime and vancomycin dosing.  Plan: Continue cefepime 2 gm IV Q12H for a total of 7 days  11/6 0816 VT 23  Adjust vancomycin regimen to Vancomycin 1250 mg IV q12h, continue for a total of 7 days  Pharmacy will continue to follow and adjust as needed to maintain trough 10-15 mcg/ml. Will monitor renal function daily.  Will check trough as clinically indicated.  Vancomycin Kinetics Using adjusted BW 128.8 kg, CrCl 100 mL/min Vd 90.2L ke 0.087 T1/2 8 hrs  Height: 5\' 4"  (162.6 cm) Weight: (!) 529 lb (240 kg) IBW/kg (Calculated) : 54.7 Adjusted body weight 71.72  Temp (24hrs), Avg:99 F (37.2 C), Min:98.4 F (36.9 C), Max:99.6 F (37.6 C)  Recent Labs  Lab 01/01/18 1649 01/01/18 1859 01/01/18 2221 01/02/18 0526 01/02/18 1810 01/03/18 0437 01/03/18 0438 01/03/18 0957 01/03/18 1849 01/04/18 0413 01/05/18 0444 01/05/18 1123  WBC 10.8*  --   --  13.1*  --   --  9.0  --   --   --  8.7  --   CREATININE 1.58*  --   --  1.13* 1.01* 0.98  --  0.78  --  0.86 0.86  --   LATICACIDVEN  --  2.3* 2.8*  --  1.4 1.2  --   --   --   --   --   --   VANCOTROUGH  --   --   --   --   --   --   --   --  22*  --   --  23*    Estimated Creatinine Clearance: 150.3 mL/min (by C-G formula based on SCr of 0.86 mg/dL).    No Known Allergies  Antimicrobials this admission: Cefepime 11/2 >> 11/8 Vancomycin 11/2 >> 11/8  Dose adjustments this admission: 11/6 Vanc 1500 mg q 12 adjusted to 1250 mg q12 11/4 Vanc 1250mg  q 8 adjusted to 1500mg  q 12  Microbiology results: 11/2 BCx: NG x 3 days  11/2 MRSA PCR: positive  Thank you for allowing pharmacy to be a part of this patient's care.   Suzzanne Cloud, PharmD Candidate Pharmacy Student  01/05/2018 2:44 PM

## 2018-01-05 NOTE — Progress Notes (Signed)
Follow up - Critical Care Medicine Note  Patient Details:    Stephanie [name] is an 57 y.o. female. with history of morbid obesity hypertension.  Patient presented to the ER with lethargy generalized weakness and frequent falls recently.  She was found to have cellulitis of the right lower extremities with acute kidney injury and rhabdomyolysis.  Patient had deterioration of respiratory status requiring intubation  Lines, Airways, Drains: Airway 8 mm (Active)  Secured at (cm) 25 cm 01/03/2018 11:30 AM  Measured From Lips 01/03/2018 11:30 AM  Secured Location Right 01/03/2018  8:00 AM  Secured By [employer] 01/03/2018 11:30 AM  Tube Holder Repositioned Yes 01/03/2018  2:16 AM  Cuff Pressure (cm H2O) 28 cm H2O 01/03/2018  8:00 AM  Site Condition Dry 01/03/2018 11:30 AM     CVC Triple Lumen 01/02/18 Right Internal jugular 25 cm 3 cm (Active)  Indication for Insertion or Continuance of Line Limited venous access - need for IV therapy >5 days (PICC only) 01/03/2018  8:00 AM  Site Assessment Clean;Dry;Intact 01/03/2018  8:00 AM  Proximal Lumen Status Infusing 01/03/2018  8:00 AM  Medial Lumen Status Saline locked 01/03/2018  8:00 AM  Distal Lumen Status Infusing 01/03/2018  8:00 AM  Dressing Type Transparent;Occlusive 01/03/2018  8:00 AM  Dressing Status Clean;Dry;Intact;Antimicrobial disc in place 01/03/2018  8:00 AM  Line Care Connections checked and tightened 01/03/2018  8:00 AM  Dressing Intervention New dressing;Dressing changed;Antimicrobial disc changed 01/02/2018  7:00 PM  Dressing Change Due 01/09/18 01/03/2018  8:00 AM     NG/OG Tube Orogastric Center mouth Xray Documented cm marking at nare/ corner of mouth 65 cm (Active)  Cm Marking at Nare/Corner of Mouth (if applicable) 65 cm 28/05/6627  8:00 AM  Site Assessment Clean;Dry;Intact 01/03/2018  8:00 AM  Ongoing Placement Verification No change in respiratory status;No change in cm markings or external length of tube from initial  placement;Xray 01/03/2018  8:00 AM  Status Infusing tube feed 01/03/2018  8:00 AM  Amount of suction 100 mmHg 01/03/2018  8:00 AM  Drainage Appearance Brown 01/03/2018  8:00 AM  Intake (mL) 100 mL 01/03/2018 10:00 AM     Urethral Catheter Jobe Marker, RN Double-lumen 16 Fr. (Active)  Indication for Insertion or Continuance of Catheter Unstable critical patients (first 24-48 hours) 01/03/2018  8:00 AM  Site Assessment Intact 01/03/2018  8:00 AM  Catheter Maintenance Bag below level of bladder;Catheter secured;Drainage bag/tubing not touching floor;No dependent loops;Seal intact 01/03/2018  8:00 AM  Collection Container Standard drainage bag 01/03/2018  8:00 AM  Securement Method Securing device (Describe) 01/03/2018  8:00 AM  Urinary Catheter Interventions Unclamped 01/03/2018  8:00 AM  Output (mL) 100 mL 01/03/2018  8:00 AM    Anti-infectives:  Anti-infectives (From admission, onward)   Start     Dose/Rate Route Frequency Ordered Stop   01/04/18 0000  vancomycin (VANCOCIN) 1,500 mg in sodium chloride 0.9 % 500 mL IVPB     1,500 mg 250 mL/hr over 120 Minutes Intravenous Every 12 hours 01/03/18 2021     01/02/18 2000  vancomycin (VANCOCIN) 1,250 mg in sodium chloride 0.9 % 250 mL IVPB  Status:  Discontinued     1,250 mg 166.7 mL/hr over 90 Minutes Intravenous Every 8 hours 01/02/18 1204 01/03/18 2021   01/02/18 0000  vancomycin (VANCOCIN) 1,750 mg in sodium chloride 0.9 % 500 mL IVPB  Status:  Discontinued     1,750 mg 250 mL/hr over 120 Minutes Intravenous Every 12 hours 01/01/18 2118  01/02/18 1204   01/01/18 2130  vancomycin (VANCOCIN) IVPB 750 mg/150 ml premix     750 mg 150 mL/hr over 60 Minutes Intravenous  Once 01/01/18 2116 01/01/18 2339   01/01/18 1830  ceFEPIme (MAXIPIME) 2 g in sodium chloride 0.9 % 100 mL IVPB     2 g 200 mL/hr over 30 Minutes Intravenous Every 12 hours 01/01/18 1826     01/01/18 1730  vancomycin (VANCOCIN) IVPB 1000 mg/200 mL premix     1,000 mg 200 mL/hr over 60  Minutes Intravenous  Once 01/01/18 1726 01/01/18 1917   01/01/18 1730  cefTRIAXone (ROCEPHIN) 2 g in sodium chloride 0.9 % 100 mL IVPB  Status:  Discontinued     2 g 200 mL/hr over 30 Minutes Intravenous Every 24 hours 01/01/18 1726 01/01/18 1814      Microbiology: Results for orders placed or performed during the hospital encounter of 01/01/18  Blood Culture (routine x 2)     Status: None (Preliminary result)   Collection Time: 01/01/18  5:09 PM  Result Value Ref Range Status   Specimen Description BLOOD LT [facility]  Final   Special Requests   Final    BOTTLES DRAWN AEROBIC AND ANAEROBIC Blood Culture results may not be optimal due to an excessive volume of blood received in culture bottles   Culture   Final    NO GROWTH 4 DAYS Performed at [facility], [address], [place], [place] [address]    Report Status PENDING  Incomplete  Blood Culture (routine x 2)     Status: None (Preliminary result)   Collection Time: 01/01/18  5:09 PM  Result Value Ref Range Status   Specimen Description BLOOD LT HAND  Final   Special Requests   Final    BOTTLES DRAWN AEROBIC AND ANAEROBIC Blood Culture adequate volume   Culture   Final    NO GROWTH 4 DAYS Performed at [facility], [address], [place], [place] [address]    Report Status PENDING  Incomplete  MRSA PCR Screening     Status: Abnormal   Collection Time: 01/01/18  9:58 PM  Result Value Ref Range Status   MRSA by PCR POSITIVE (A) NEGATIVE Final    Comment:        The GeneXpert MRSA Assay (FDA approved for NASAL specimens only), is one component of a comprehensive MRSA colonization surveillance program. It is not intended to diagnose MRSA infection nor to guide or monitor treatment for MRSA infections. RESULT CALLED TO, READ BACK BY AND VERIFIED WITH: FELICIA PREDUHOMME ON 25/4/27 AT 2356 [facility] Performed at [facility], [address] [place], [place] [address]   Studies: Dg Abd 1 View  Result  Date: 01/02/2018 CLINICAL DATA:  NG tube placement EXAM: ABDOMEN - 1 VIEW COMPARISON:  None. FINDINGS: Limited exam secondary to body habitus. NG tube appears within stomach IMPRESSION: NG tube within stomach. Electronically Signed   By: Suzy Bouchard M.D.   On: 01/02/2018 12:40   Dg Chest Port 1 View  Result Date: 01/05/2018 CLINICAL DATA:  Acute respiratory failure. EXAM: PORTABLE CHEST 1 VIEW COMPARISON:  Radiograph of January 04, 2018. FINDINGS: Stable cardiomegaly. Endotracheal and nasogastric tubes are unchanged in position. Mild central pulmonary vascular congestion is noted. No pneumothorax or definite pleural effusion is noted. No consolidative process is noted. Bony thorax is unremarkable. IMPRESSION: Stable cardiomegaly with central pulmonary vascular congestion. Stable support apparatus. Electronically Signed   By: Marijo Conception, M.D.  On: 01/05/2018 07:17   Dg Chest Port 1 View  Result Date: 01/04/2018 CLINICAL DATA:  Intubated patient with acute kidney injury, rhabdomyolysis and hypertension. EXAM: PORTABLE CHEST 1 VIEW COMPARISON:  Single-view of the chest 01/03/2018 and 01/02/2018. FINDINGS: The patient's right IJ catheter appears to have backed out with the tip now in the distal right internal jugular vein near its confluence with the right subclavian vein. However, visualization could be limited. Endotracheal tube and NG tube are again seen. There is cardiomegaly and vascular congestion. Mild bibasilar atelectasis is noted. IMPRESSION: Right IJ catheter appears to have backed out with its tip now projecting in the distal right internal jugular vein. Visualization may be limited on this portable examination given the patient's habitus. Cardiomegaly and pulmonary vascular congestion. Mild bibasilar airspace disease, likely atelectasis. Electronically Signed   By: Inge Rise M.D.   On: 01/04/2018 11:20   Dg Chest Port 1 View  Result Date: 01/03/2018 CLINICAL DATA:  Sepsis,  atelectasis. EXAM: PORTABLE CHEST 1 VIEW COMPARISON:  Portable chest x-ray of January 02, 2018. FINDINGS: The lungs are adequately inflated. The interstitial markings are increased. The pulmonary vascularity is engorged. The cardiac silhouette is enlarged. There is calcification in the wall of the aortic arch. The endotracheal tube tip projects approximately 3.3 cm above the carina. The esophagogastric tube tip is difficult to discern but appears to project below the GE junction. The right internal jugular venous catheter tip projects over the proximal SVC. The observed bony thorax is unremarkable. IMPRESSION: Patchy airspace opacities as well as interstitial prominence bilaterally likely reflects CHF and interstitial edema. One cannot absolutely exclude pneumonia. The endotracheal tube tip is in reasonable position. The esophagogastric tube tip cannot be clearly discerned. Thoracic aortic atherosclerosis. Electronically Signed   By: David  Martinique M.D.   On: 01/03/2018 07:42   Dg Chest Port 1 View  Result Date: 01/02/2018 CLINICAL DATA:  Check central line placement EXAM: PORTABLE CHEST 1 VIEW COMPARISON:  01/02/2018 FINDINGS: Right jugular central line is better appreciated on this exam extending into at least the mid superior vena cava. The more distal aspect is not as well visualized again due to patient's body habitus. Cardiac shadow remains enlarged. Endotracheal tube and nasogastric catheter are again noted in satisfactory position. Vascular congestion is again noted. No evidence of pneumothorax. IMPRESSION: Right jugular central line extending into the SVC. No pneumothorax is noted. Electronically Signed   By: Inez Catalina M.D.   On: 01/02/2018 19:21   Dg Chest Port 1 View  Result Date: 01/02/2018 CLINICAL DATA:  Check central line placement EXAM: PORTABLE CHEST 1 VIEW COMPARISON:  01/02/2018 FINDINGS: The examination is significantly limited by patient's body habitus. Previously seen central line is  faintly visualized although the tip is not discretely seen. Endotracheal tube and nasogastric catheter are again noted. The lungs are again well aerated with mild vascular congestion. No pneumothorax is noted. IMPRESSION: Stable appearance of the chest. The right jugular central line is not well appreciated on this study in part due to patient body habitus as well as motion. Changes of vascular congestion are again seen Electronically Signed   By: Inez Catalina M.D.   On: 01/02/2018 14:54   Dg Chest Port 1 View  Result Date: 01/02/2018 CLINICAL DATA:  Central line placement. EXAM: PORTABLE CHEST 1 VIEW COMPARISON:  Chest x-ray from same day at 11:55 a.m. FINDINGS: Endotracheal tube tip approximately 1.8 cm above the carina. Unchanged enteric tube. Interval placement of a right internal jugular  central venous catheter with the tip near the confluence of the internal jugular and brachiocephalic veins. Stable cardiomegaly and mild interstitial edema. Unchanged retrocardiac opacification and probable left pleural effusion. The right lung is clear. No pneumothorax. No acute osseous abnormality. IMPRESSION: 1. Right internal jugular central venous catheter with tip near the confluence of the internal jugular and brachiocephalic veins. Consider advancing or exchanging for a longer catheter. 2. No pneumothorax. 3. Stable congestive heart failure and retrocardiac airspace disease, likely atelectasis. Electronically Signed   By: Titus Dubin M.D.   On: 01/02/2018 13:27   Dg Chest Port 1 View  Result Date: 01/02/2018 CLINICAL DATA:  Endotracheal tube and OG tube placement. EXAM: PORTABLE CHEST 1 VIEW COMPARISON:  One-view chest x-ray 01/01/2018 FINDINGS: Heart is enlarged. Mild edema has slightly increased. Retrocardiac opacification is present. No significant right-sided effusion is present. The patient has been intubated. The endotracheal tube terminates 2.5 cm above the carina. OG tube courses off the inferior  border of the film. IMPRESSION: 1. Intubation. The endotracheal tube terminates 2.5 cm above the carina. 2. Cardiomegaly with increasing interstitial edema and congestive heart failure. 3. Retrocardiac airspace disease likely reflects atelectasis. Electronically Signed   By: San Morelle M.D.   On: 01/02/2018 12:38   Dg Chest Port 1 View  Result Date: 01/01/2018 CLINICAL DATA:  Fall.  Morbid obesity. EXAM: PORTABLE CHEST 1 VIEW COMPARISON:  None. FINDINGS: The study is limited by the low volume portable technique in positioning. There appears to be significant enlarged of the cardiac silhouette which may represent cardiomegaly. No pneumothorax. No nodules or masses. No obvious infiltrates. The left retrocardiac region is not well assessed. IMPRESSION: 1. Enlargement of the cardiac silhouette. The study is limited but no focal infiltrate noted. The left retrocardiac region in particular is not well evaluated. Electronically Signed   By: Dorise Bullion III M.D   On: 01/01/2018 19:07   Dg Tibia/fibula Right Port  Result Date: 01/01/2018 CLINICAL DATA:  Fall. EXAM: PORTABLE RIGHT TIBIA AND FIBULA - 2 VIEW COMPARISON:  None. FINDINGS: The study is limited due to poor penetration due to the patient's body habitus. No definitive fractures noted. IMPRESSION: Limited study due to body habitus.  No obvious fractures. Electronically Signed   By: Dorise Bullion III M.D   On: 01/01/2018 19:08   [place] Ekg Site Rite  Result Date: 01/02/2018 If Site Rite image not attached, placement could not be confirmed due to current cardiac rhythm.   Subjective:    Overnight Issues: No significant issues noted overnight, patient failed spontaneous awakening and breathing trials.    Objective:  Vital signs for last 24 hours: Temp:  [98.4 F (36.9 C)-99.4 F (37.4 C)] 99.4 F (37.4 C) (11/06 0826) Pulse Rate:  [81-126] 90 (11/06 0826) Resp:  [18-24] 19 (11/06 0826) BP: (98-159)/(57-95) 139/67 (11/06 0800) SpO2:   [92 %-98 %] 96 % (11/06 0826) FiO2 (%):  [35 %] 35 % (11/06 0809) Weight:  [240 kg] 240 kg (11/06 0406)  Hemodynamic parameters for last 24 hours:    Intake/Output from previous day: 11/05 0701 - 11/06 0700 In: 6314.4 [I.V.:3356.6; NG/GT:1125; IV Piggyback:1182.8] Out: 2925 [Urine:2925]  Intake/Output this shift: No intake/output data recorded.  Vent settings for last 24 hours: Vent Mode: PSV FiO2 (%):  [35 %] 35 % Set Rate:  [20 bmp] 20 bmp Vt Set:  [600 mL] 600 mL PEEP:  [5 cmH20] 5 cmH20 Pressure Support:  [10 cmH20] 10 cmH20  Physical Exam:  Vital signs:  Please see the above listed vital signs HEENT: Patient is orally intubated, no accessory muscle utilization Cardiovascular: Tachycardia regular rate and rhythm Pulmonary: Diminished breath sounds Abdominal: Massive obesity Extremities: Massive lower extremity edema with superimposed cellulitis and chronic venous stasis changes Neurologic: Patient moves all extremities and is responsive  Assessment/Plan:  Acute hypercapnic respiratory failure, with 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 Patient's respiratory status has improved some.  We will begin spontaneous awakening and breathing trials as tolerated  Lower extremity cellulitis.  Patient is on cefepime and vancomycin, most likely chronic lymphedema from massive obesity  Transaminitis.  Will follow liver function tests  Conjunctivitis. On Tobrex  Hypernatremia.  Will change fluids to D5W  Critical  care time 35 minutes  Stephanie [name] 01/05/2018  *Care during the described time interval was provided by me and/or other providers on the critical care team.  I have reviewed this patient's available data, including medical history, events of note, physical examination and test results as part of my evaluation. Patient ID: Stephanie [name], female   DOB: 02/13/1961, 57 y.o.   MRN: [id number] Patient ID: Stephanie [name], female   DOB: September 19, 1960,  57 y.o.   MRN: [id number]

## 2018-01-05 NOTE — Consult Note (Signed)
Pharmacy Electrolyte Monitoring Consult:  Pharmacy consulted to assist in monitoring and replacing electrolytes in this 57 y.o. female admitted on 01/01/2018 with Sepsis. Patient has hypercapnic respiratory failure and lower extremity cellulitis.   Labs:  Sodium (mmol/L)  Date Value  01/05/2018 146 (H)   Potassium (mmol/L)  Date Value  01/05/2018 3.0 (L)   Magnesium (mg/dL)  Date Value  01/05/2018 2.1   Phosphorus (mg/dL)  Date Value  01/05/2018 2.5   Calcium (mg/dL)  Date Value  01/05/2018 6.9 (L)   Albumin (g/dL)  Date Value  01/04/2018 2.2 (L)   Corrected calcium = 9.64  Assessment/Plan: Will replace for goal potassium ~4, goal magnesium ~ 2, and goal phosphorus > 2.5.  Potassium chloride IV 10 meq x 4  Sodium is high - continue to monitor (has lowered since fluid switch to d5w)  Will follow and order electrolytes with AM labs.   Pharmacy will continue to monitor and adjust for Glucose, Constipation, and Electrolytes per consult.    Lu Duffel, PharmD Clinical Pharmacist 01/05/2018 8:35 PM

## 2018-01-05 NOTE — Consult Note (Addendum)
Pharmacy Electrolyte Monitoring Consult:  Pharmacy consulted to assist in monitoring and replacing electrolytes in this 57 y.o. female admitted on 01/01/2018 with Sepsis. Patient has hypercapnic respiratory failure and lower extremity cellulitis.   Labs:  Sodium (mmol/L)  Date Value  01/05/2018 149 (H)   Potassium (mmol/L)  Date Value  01/05/2018 3.8   Magnesium (mg/dL)  Date Value  01/05/2018 2.1   Phosphorus (mg/dL)  Date Value  01/05/2018 2.5   Calcium (mg/dL)  Date Value  01/05/2018 8.2 (L)   Albumin (g/dL)  Date Value  01/04/2018 2.2 (L)   Corrected calcium = 9.64  Assessment/Plan: Electrolytes: Will replace for goal potassium ~4, goal magnesium ~ 2, and goal phosphorus > 2.5.   Sodium is high. Switched from 0.45% sodium chloride continuous infusion to D5W continuous infusion. Scheduled free water 100 mL VT Q4hr.   Follow up with BMP @ 1800.  Will order electrolytes with AM labs.  Glucose:  Patient is not diabetic. BG for last 24 hours 83-110.   Constipation: No recorded BM. Patient has prn Miralax orders, start scheduled Miralax 11/6. Patient is intubated. Patient is receiving Pro-Stat Sugar Free 64 liquid 60 mL BID and Vital High Protein liquid 1000 mL QDay via OG tube. Will reassess 11/7.  Pharmacy will continue to monitor and adjust per consult.    Suzzanne Cloud, PharmD Candidate Pharmacy Student 01/05/2018 2:11 PM

## 2018-01-05 NOTE — Progress Notes (Signed)
[facility] NAME: Stephanie [name]    MR#:  [id number]  DATE OF BIRTH:  09/13/1960  SUBJECTIVE:   Patient came into the ER with frequent falls. She was found to have acute rhabdomyolysis.   Remains critically ill and on the ventilator. Patient is not tolerating the ventilator wean REVIEW OF SYSTEMS:   Review of Systems  Unable to perform ROS: Intubated   Tolerating Diet:TF Tolerating PT: intubated  DRUG ALLERGIES:  No Known Allergies  VITALS:  Blood pressure 126/78, pulse 87, temperature 99.6 F (37.6 C), temperature source Oral, resp. rate 19, height 5\' 4"  (1.626 m), weight (!) 240 kg, SpO2 96 %.  PHYSICAL EXAMINATION:   Physical Exam  GENERAL:  57 y.o.-year-old patient lying in the bed with no acute distress. Morbidly obese EYES: Pupils equal, round, reactive to light and accommodation. No scleral icterus. Extraocular muscles intact.  HEENT: Head atraumatic, normocephalic. Oropharynx and nasopharynx clear. Intubated on the ventilator NECK:  Supple, no jugular venous distention. No thyroid enlargement, no tenderness.  LUNGS: Normal breath sounds bilaterally, no wheezing, rales, rhonchi. No use of accessory muscles of respiration.  CARDIOVASCULAR: S1, S2 normal. No murmurs, rubs, or gallops.  ABDOMEN: Soft, nontender, nondistended. Bowel sounds present. No organomegaly or mass.  EXTREMITIES: bilateral lower extremity lymphedema NEUROLOGIC: on the ventilator PSYCHIATRIC: on the ventilator SKIN: No obvious rash, lesion, or ulcer.   LABORATORY PANEL:  CBC Recent Labs  Lab 01/05/18 0444  WBC 8.7  HGB 11.4*  HCT 38.6  PLT 203    Chemistries  Recent Labs  Lab 01/04/18 0413 01/05/18 0444 01/05/18 1123  NA 149* 150* 149*  K 3.7 3.8  --   CL 113* 115*  --   CO2 29 31  --   GLUCOSE 113* 110*  --   BUN 25* 28*  --   CREATININE 0.86 0.86  --   CALCIUM 8.0* 8.2*  --   MG 2.0 2.1  --   AST 286*  --   --    ALT 185*  --   --   ALKPHOS 42  --   --   BILITOT 1.1  --   --    Cardiac Enzymes Recent Labs  Lab 01/01/18 1649  TROPONINI 0.24*   RADIOLOGY:  Dg Chest Port 1 View  Result Date: 01/05/2018 CLINICAL DATA:  Acute respiratory failure. EXAM: PORTABLE CHEST 1 VIEW COMPARISON:  Radiograph of January 04, 2018. FINDINGS: Stable cardiomegaly. Endotracheal and nasogastric tubes are unchanged in position. Mild central pulmonary vascular congestion is noted. No pneumothorax or definite pleural effusion is noted. No consolidative process is noted. Bony thorax is unremarkable. IMPRESSION: Stable cardiomegaly with central pulmonary vascular congestion. Stable support apparatus. Electronically Signed   By: Marijo Conception, M.D.   On: 01/05/2018 07:17   Dg Chest Port 1 View  Result Date: 01/04/2018 CLINICAL DATA:  Intubated patient with acute kidney injury, rhabdomyolysis and hypertension. EXAM: PORTABLE CHEST 1 VIEW COMPARISON:  Single-view of the chest 01/03/2018 and 01/02/2018. FINDINGS: The patient's right IJ catheter appears to have backed out with the tip now in the distal right internal jugular vein near its confluence with the right subclavian vein. However, visualization could be limited. Endotracheal tube and NG tube are again seen. There is cardiomegaly and vascular congestion. Mild bibasilar atelectasis is noted. IMPRESSION: Right IJ catheter appears to have backed out with its tip now projecting in the distal right internal jugular vein. Visualization may  be limited on this portable examination given the patient's habitus. Cardiomegaly and pulmonary vascular congestion. Mild bibasilar airspace disease, likely atelectasis. Electronically Signed   By: Inge Rise M.D.   On: 01/04/2018 11:20   [place] Ekg Site Rite  Result Date: 01/05/2018 If Site Rite image not attached, placement could not be confirmed due to current cardiac rhythm.  ASSESSMENT AND PLAN:  Stephanie [name]  is a 57 y.o. female  with a known history of hypertension, morbid obesity presented to the emergency room due to extreme fatigue, falls.  Patient was found to have multiple areas of skin breakdown.  Tachycardia, hypoxic respiratory failure. She has had trouble with a chronic wound on her right leg which has gotten weaker over time.    * Sepsis secondary to right lower extremity cellulitis.   likely has chronic edema secondary to massive obesity/chronic lymphedema. -On broad-spectrum antibiotics with vancomycin and cefepime---de-escalate antibiotics when able to. -Blood culture so far negative. -MRSA PCR positive  *Acute hypoxic respiratory failure in the setting of sepsis with obesity hypoventilation syndrome -patient was falling asleep and not able to protect her areas. She got intubated and placed on the ventilator -unable to wean at present -appreciate ICU attending input  *Acute kidney injury secondary to sepsis and Rhabdomyolysis -IV fluids.   -creat better Elevated sodium  150--149  *Hypertension.  Blood pressure in the normal range.  Hold medications.  *Elevated troponin due to rhabdomyolysis and demand ischemia No chest pain EKG shows no ST changes  *Acute rhabdomyolysis due to recurrent falls.  - IV fluids.   -CPK trending down   Case discussed with Care Management/Social Worker. Management plans discussed with the patient, family and they are in agreement.  CODE STATUS: full  DVT Prophylaxis: Lovenox  TOTAL TIME TAKING CARE OF THIS PATIENT: *30* minutes.  >50% time spent on counselling and coordination of care  POSSIBLE D/C IN ?? DAYS, DEPENDING ON CLINICAL CONDITION.  Note: This dictation was prepared with Dragon dictation along with smaller phrase technology. Any transcriptional errors that result from this process are unintentional.  Fritzi Mandes M.D on 01/05/2018 at 3:34 PM  Between 7am to 6pm - Pager - [phone]  After 6pm go to [email or web address] - password [email or web address]  [facility]  Office  [phone]  CC: Primary care physician; Patient, No Pcp PerPatient ID: Stephanie [name], female   DOB: 02-May-1960, 57 y.o.   MRN: [id number]

## 2018-01-06 ENCOUNTER — Inpatient Hospital Stay: Payer: BLUE CROSS/BLUE SHIELD

## 2018-01-06 LAB — BASIC METABOLIC PANEL
Anion gap: 5 (ref 5–15)
BUN: 28 mg/dL — ABNORMAL HIGH (ref 6–20)
CO2: 31 mmol/L (ref 22–32)
Calcium: 8.6 mg/dL — ABNORMAL LOW (ref 8.9–10.3)
Chloride: 113 mmol/L — ABNORMAL HIGH (ref 98–111)
Creatinine, Ser: 0.81 mg/dL (ref 0.44–1.00)
GFR calc Af Amer: >60 mL/min (ref >60)
Glucose, Bld: 115 mg/dL — ABNORMAL HIGH (ref 70–99)
Potassium: 4 mmol/L (ref 3.5–5.1)
SODIUM: 149 mmol/L — AB (ref 135–145)

## 2018-01-06 LAB — HEPATIC FUNCTION PANEL
ALBUMIN: 2.3 g/dL — AB (ref 3.5–5.0)
ALT: 135 U/L — ABNORMAL HIGH (ref 0–44)
AST: 124 U/L — ABNORMAL HIGH (ref 15–41)
Alkaline Phosphatase: 38 U/L (ref 38–126)
Bilirubin, Direct: 0.2 mg/dL (ref 0.0–0.2)
Indirect Bilirubin: 0.8 mg/dL (ref 0.3–0.9)
Total Bilirubin: 1 mg/dL (ref 0.3–1.2)
Total Protein: 6.5 g/dL (ref 6.5–8.1)

## 2018-01-06 LAB — CBC
HCT: 41.5 % (ref 36.0–46.0)
Hemoglobin: 11.9 g/dL — ABNORMAL LOW (ref 12.0–15.0)
MCH: 33 pg (ref 26.0–34.0)
MCHC: 28.7 g/dL — AB (ref 30.0–36.0)
MCV: 115 fL — ABNORMAL HIGH (ref 80.0–100.0)
PLATELETS: 220 10*3/uL (ref 150–400)
RBC: 3.61 MIL/uL — AB (ref 3.87–5.11)
RDW: 17.4 % — AB (ref 11.5–15.5)
WBC: 8.8 10*3/uL (ref 4.0–10.5)
nRBC: 0 % (ref 0.0–0.2)

## 2018-01-06 LAB — CULTURE, BLOOD (ROUTINE X 2)
Culture: NO GROWTH
Culture: NO GROWTH
SPECIAL REQUESTS: ADEQUATE

## 2018-01-06 LAB — BLOOD GAS, ARTERIAL
ACID-BASE EXCESS: 4.5 mmol/L — AB (ref 0.0–2.0)
Bicarbonate: 31.8 mmol/L — ABNORMAL HIGH (ref 20.0–28.0)
FIO2: 30
O2 Saturation: 96.2 %
PCO2 ART: 59 mmHg — AB (ref 32.0–48.0)
PEEP/CPAP: 8 cmH2O
PH ART: 7.34 — AB (ref 7.350–7.450)
Patient temperature: 37
Pressure support: 16 cmH2O
pO2, Arterial: 88 mmHg (ref 83.0–108.0)

## 2018-01-06 LAB — GLUCOSE, CAPILLARY
GLUCOSE-CAPILLARY: 90 mg/dL (ref 70–99)
GLUCOSE-CAPILLARY: 94 mg/dL (ref 70–99)
Glucose-Capillary: 90 mg/dL (ref 70–99)
Glucose-Capillary: 75 mg/dL (ref 70–99)
Glucose-Capillary: 90 mg/dL (ref 70–99)
Glucose-Capillary: 91 mg/dL (ref 70–99)

## 2018-01-06 LAB — MAGNESIUM: MAGNESIUM: 2.1 mg/dL (ref 1.7–2.4)

## 2018-01-06 MED ORDER — FENTANYL CITRATE (PF) 100 MCG/2ML IJ SOLN: 100.0000 ug | INTRAMUSCULAR | Status: DC | PRN

## 2018-01-06 MED ORDER — FENTANYL CITRATE (PF) 100 MCG/2ML IJ SOLN
100.0000 ug | INTRAMUSCULAR | Status: DC | PRN
  Administered 2018-01-06: 100 ug via INTRAVENOUS
  Filled 2018-01-06: qty 2

## 2018-01-06 NOTE — Progress Notes (Signed)
Follow up - Critical Care Medicine Note  Patient Details:    Stephanie [name] is an 57 y.o. female. with history of morbid obesity hypertension.  Patient presented to the ER with lethargy generalized weakness and frequent falls recently.  She was found to have cellulitis of the right lower extremities with acute kidney injury and rhabdomyolysis.  Patient had deterioration of respiratory status requiring intubation  Lines, Airways, Drains: Airway 8 mm (Active)  Secured at (cm) 25 cm 01/03/2018 11:30 AM  Measured From Lips 01/03/2018 11:30 AM  Secured Location Right 01/03/2018  8:00 AM  Secured By [employer] 01/03/2018 11:30 AM  Tube Holder Repositioned Yes 01/03/2018  2:16 AM  Cuff Pressure (cm H2O) 28 cm H2O 01/03/2018  8:00 AM  Site Condition Dry 01/03/2018 11:30 AM     CVC Triple Lumen 01/02/18 Right Internal jugular 25 cm 3 cm (Active)  Indication for Insertion or Continuance of Line Limited venous access - need for IV therapy >5 days (PICC only) 01/03/2018  8:00 AM  Site Assessment Clean;Dry;Intact 01/03/2018  8:00 AM  Proximal Lumen Status Infusing 01/03/2018  8:00 AM  Medial Lumen Status Saline locked 01/03/2018  8:00 AM  Distal Lumen Status Infusing 01/03/2018  8:00 AM  Dressing Type Transparent;Occlusive 01/03/2018  8:00 AM  Dressing Status Clean;Dry;Intact;Antimicrobial disc in place 01/03/2018  8:00 AM  Line Care Connections checked and tightened 01/03/2018  8:00 AM  Dressing Intervention New dressing;Dressing changed;Antimicrobial disc changed 01/02/2018  7:00 PM  Dressing Change Due 01/09/18 01/03/2018  8:00 AM     NG/OG Tube Orogastric Center mouth Xray Documented cm marking at nare/ corner of mouth 65 cm (Active)  Cm Marking at Nare/Corner of Mouth (if applicable) 65 cm 99/09/3380  8:00 AM  Site Assessment Clean;Dry;Intact 01/03/2018  8:00 AM  Ongoing Placement Verification No change in respiratory status;No change in cm markings or external length of tube from initial  placement;Xray 01/03/2018  8:00 AM  Status Infusing tube feed 01/03/2018  8:00 AM  Amount of suction 100 mmHg 01/03/2018  8:00 AM  Drainage Appearance Brown 01/03/2018  8:00 AM  Intake (mL) 100 mL 01/03/2018 10:00 AM     Urethral Catheter Stephanie Marker, RN Double-lumen 16 Fr. (Active)  Indication for Insertion or Continuance of Catheter Unstable critical patients (first 24-48 hours) 01/03/2018  8:00 AM  Site Assessment Intact 01/03/2018  8:00 AM  Catheter Maintenance Bag below level of bladder;Catheter secured;Drainage bag/tubing not touching floor;No dependent loops;Seal intact 01/03/2018  8:00 AM  Collection Container Standard drainage bag 01/03/2018  8:00 AM  Securement Method Securing device (Describe) 01/03/2018  8:00 AM  Urinary Catheter Interventions Unclamped 01/03/2018  8:00 AM  Output (mL) 100 mL 01/03/2018  8:00 AM    Anti-infectives:  Anti-infectives (From admission, onward)   Start     Dose/Rate Route Frequency Ordered Stop   01/06/18 0000  vancomycin (VANCOCIN) 1,250 mg in sodium chloride 0.9 % 250 mL IVPB     1,250 mg 166.7 mL/hr over 90 Minutes Intravenous Every 12 hours 01/05/18 1210     01/04/18 0000  vancomycin (VANCOCIN) 1,500 mg in sodium chloride 0.9 % 500 mL IVPB  Status:  Discontinued     1,500 mg 250 mL/hr over 120 Minutes Intravenous Every 12 hours 01/03/18 2021 01/05/18 1210   01/02/18 2000  vancomycin (VANCOCIN) 1,250 mg in sodium chloride 0.9 % 250 mL IVPB  Status:  Discontinued     1,250 mg 166.7 mL/hr over 90 Minutes Intravenous Every 8 hours 01/02/18 1204  01/03/18 2021   01/02/18 0000  vancomycin (VANCOCIN) 1,750 mg in sodium chloride 0.9 % 500 mL IVPB  Status:  Discontinued     1,750 mg 250 mL/hr over 120 Minutes Intravenous Every 12 hours 01/01/18 2118 01/02/18 1204   01/01/18 2130  vancomycin (VANCOCIN) IVPB 750 mg/150 ml premix     750 mg 150 mL/hr over 60 Minutes Intravenous  Once 01/01/18 2116 01/01/18 2339   01/01/18 1830  ceFEPIme (MAXIPIME) 2 g in sodium  chloride 0.9 % 100 mL IVPB     2 g 200 mL/hr over 30 Minutes Intravenous Every 12 hours 01/01/18 1826     01/01/18 1730  vancomycin (VANCOCIN) IVPB 1000 mg/200 mL premix     1,000 mg 200 mL/hr over 60 Minutes Intravenous  Once 01/01/18 1726 01/01/18 1917   01/01/18 1730  cefTRIAXone (ROCEPHIN) 2 g in sodium chloride 0.9 % 100 mL IVPB  Status:  Discontinued     2 g 200 mL/hr over 30 Minutes Intravenous Every 24 hours 01/01/18 1726 01/01/18 1814      Microbiology: Results for orders placed or performed during the hospital encounter of 01/01/18  Blood Culture (routine x 2)     Status: None   Collection Time: 01/01/18  5:09 PM  Result Value Ref Range Status   Specimen Description BLOOD LT [facility]  Final   Special Requests   Final    BOTTLES DRAWN AEROBIC AND ANAEROBIC Blood Culture results may not be optimal due to an excessive volume of blood received in culture bottles   Culture   Final    NO GROWTH 5 DAYS Performed at [facility], [address] [place], [place] [address]    Report Status 01/06/2018 FINAL  Final  Blood Culture (routine x 2)     Status: None   Collection Time: 01/01/18  5:09 PM  Result Value Ref Range Status   Specimen Description BLOOD LT HAND  Final   Special Requests   Final    BOTTLES DRAWN AEROBIC AND ANAEROBIC Blood Culture adequate volume   Culture   Final    NO GROWTH 5 DAYS Performed at [facility], [address] [place], [place] [address]    Report Status 01/06/2018 FINAL  Final  MRSA PCR Screening     Status: Abnormal   Collection Time: 01/01/18  9:58 PM  Result Value Ref Range Status   MRSA by PCR POSITIVE (A) NEGATIVE Final    Comment:        The GeneXpert MRSA Assay (FDA approved for NASAL specimens only), is one component of a comprehensive MRSA colonization surveillance program. It is not intended to diagnose MRSA infection nor to guide or monitor treatment for MRSA infections. RESULT CALLED TO, READ BACK BY AND  VERIFIED WITH: FELICIA PREDUHOMME ON 34/7/42 AT 2356 [facility] Performed at [facility], [address] [place], [place] [address]   Studies: Dg Abd 1 View  Result Date: 01/02/2018 CLINICAL DATA:  NG tube placement EXAM: ABDOMEN - 1 VIEW COMPARISON:  None. FINDINGS: Limited exam secondary to body habitus. NG tube appears within stomach IMPRESSION: NG tube within stomach. Electronically Signed   By: Suzy Bouchard M.D.   On: 01/02/2018 12:40   Dg Chest Port 1 View  Result Date: 01/05/2018 CLINICAL DATA:  Acute respiratory failure. EXAM: PORTABLE CHEST 1 VIEW COMPARISON:  Radiograph of January 04, 2018. FINDINGS: Stable cardiomegaly. Endotracheal and nasogastric tubes are unchanged in position. Mild central pulmonary vascular congestion is noted.  No pneumothorax or definite pleural effusion is noted. No consolidative process is noted. Bony thorax is unremarkable. IMPRESSION: Stable cardiomegaly with central pulmonary vascular congestion. Stable support apparatus. Electronically Signed   By: Marijo Conception, M.D.   On: 01/05/2018 07:17   Dg Chest Port 1 View  Result Date: 01/04/2018 CLINICAL DATA:  Intubated patient with acute kidney injury, rhabdomyolysis and hypertension. EXAM: PORTABLE CHEST 1 VIEW COMPARISON:  Single-view of the chest 01/03/2018 and 01/02/2018. FINDINGS: The patient's right IJ catheter appears to have backed out with the tip now in the distal right internal jugular vein near its confluence with the right subclavian vein. However, visualization could be limited. Endotracheal tube and NG tube are again seen. There is cardiomegaly and vascular congestion. Mild bibasilar atelectasis is noted. IMPRESSION: Right IJ catheter appears to have backed out with its tip now projecting in the distal right internal jugular vein. Visualization may be limited on this portable examination given the patient's habitus. Cardiomegaly and pulmonary vascular congestion. Mild bibasilar airspace  disease, likely atelectasis. Electronically Signed   By: Inge Rise M.D.   On: 01/04/2018 11:20   Dg Chest Port 1 View  Result Date: 01/03/2018 CLINICAL DATA:  Sepsis, atelectasis. EXAM: PORTABLE CHEST 1 VIEW COMPARISON:  Portable chest x-ray of January 02, 2018. FINDINGS: The lungs are adequately inflated. The interstitial markings are increased. The pulmonary vascularity is engorged. The cardiac silhouette is enlarged. There is calcification in the wall of the aortic arch. The endotracheal tube tip projects approximately 3.3 cm above the carina. The esophagogastric tube tip is difficult to discern but appears to project below the GE junction. The right internal jugular venous catheter tip projects over the proximal SVC. The observed bony thorax is unremarkable. IMPRESSION: Patchy airspace opacities as well as interstitial prominence bilaterally likely reflects CHF and interstitial edema. One cannot absolutely exclude pneumonia. The endotracheal tube tip is in reasonable position. The esophagogastric tube tip cannot be clearly discerned. Thoracic aortic atherosclerosis. Electronically Signed   By: David  Martinique M.D.   On: 01/03/2018 07:42   Dg Chest Port 1 View  Result Date: 01/02/2018 CLINICAL DATA:  Check central line placement EXAM: PORTABLE CHEST 1 VIEW COMPARISON:  01/02/2018 FINDINGS: Right jugular central line is better appreciated on this exam extending into at least the mid superior vena cava. The more distal aspect is not as well visualized again due to patient's body habitus. Cardiac shadow remains enlarged. Endotracheal tube and nasogastric catheter are again noted in satisfactory position. Vascular congestion is again noted. No evidence of pneumothorax. IMPRESSION: Right jugular central line extending into the SVC. No pneumothorax is noted. Electronically Signed   By: Inez Catalina M.D.   On: 01/02/2018 19:21   Dg Chest Port 1 View  Result Date: 01/02/2018 CLINICAL DATA:  Check central  line placement EXAM: PORTABLE CHEST 1 VIEW COMPARISON:  01/02/2018 FINDINGS: The examination is significantly limited by patient's body habitus. Previously seen central line is faintly visualized although the tip is not discretely seen. Endotracheal tube and nasogastric catheter are again noted. The lungs are again well aerated with mild vascular congestion. No pneumothorax is noted. IMPRESSION: Stable appearance of the chest. The right jugular central line is not well appreciated on this study in part due to patient body habitus as well as motion. Changes of vascular congestion are again seen Electronically Signed   By: Inez Catalina M.D.   On: 01/02/2018 14:54   Dg Chest Port 1 View  Result Date: 01/02/2018 CLINICAL  DATA:  Central line placement. EXAM: PORTABLE CHEST 1 VIEW COMPARISON:  Chest x-ray from same day at 11:55 a.m. FINDINGS: Endotracheal tube tip approximately 1.8 cm above the carina. Unchanged enteric tube. Interval placement of a right internal jugular central venous catheter with the tip near the confluence of the internal jugular and brachiocephalic veins. Stable cardiomegaly and mild interstitial edema. Unchanged retrocardiac opacification and probable left pleural effusion. The right lung is clear. No pneumothorax. No acute osseous abnormality. IMPRESSION: 1. Right internal jugular central venous catheter with tip near the confluence of the internal jugular and brachiocephalic veins. Consider advancing or exchanging for a longer catheter. 2. No pneumothorax. 3. Stable congestive heart failure and retrocardiac airspace disease, likely atelectasis. Electronically Signed   By: Titus Dubin M.D.   On: 01/02/2018 13:27   Dg Chest Port 1 View  Result Date: 01/02/2018 CLINICAL DATA:  Endotracheal tube and OG tube placement. EXAM: PORTABLE CHEST 1 VIEW COMPARISON:  One-view chest x-ray 01/01/2018 FINDINGS: Heart is enlarged. Mild edema has slightly increased. Retrocardiac opacification is  present. No significant right-sided effusion is present. The patient has been intubated. The endotracheal tube terminates 2.5 cm above the carina. OG tube courses off the inferior border of the film. IMPRESSION: 1. Intubation. The endotracheal tube terminates 2.5 cm above the carina. 2. Cardiomegaly with increasing interstitial edema and congestive heart failure. 3. Retrocardiac airspace disease likely reflects atelectasis. Electronically Signed   By: San Morelle M.D.   On: 01/02/2018 12:38   Dg Chest Port 1 View  Result Date: 01/01/2018 CLINICAL DATA:  Fall.  Morbid obesity. EXAM: PORTABLE CHEST 1 VIEW COMPARISON:  None. FINDINGS: The study is limited by the low volume portable technique in positioning. There appears to be significant enlarged of the cardiac silhouette which may represent cardiomegaly. No pneumothorax. No nodules or masses. No obvious infiltrates. The left retrocardiac region is not well assessed. IMPRESSION: 1. Enlargement of the cardiac silhouette. The study is limited but no focal infiltrate noted. The left retrocardiac region in particular is not well evaluated. Electronically Signed   By: Dorise Bullion III M.D   On: 01/01/2018 19:07   Dg Tibia/fibula Right Port  Result Date: 01/01/2018 CLINICAL DATA:  Fall. EXAM: PORTABLE RIGHT TIBIA AND FIBULA - 2 VIEW COMPARISON:  None. FINDINGS: The study is limited due to poor penetration due to the patient's body habitus. No definitive fractures noted. IMPRESSION: Limited study due to body habitus.  No obvious fractures. Electronically Signed   By: Dorise Bullion III M.D   On: 01/01/2018 19:08   [place] Ekg Site Rite  Result Date: 01/05/2018 If Site Rite image not attached, placement could not be confirmed due to current cardiac rhythm.  [place] Ekg Site Rite  Result Date: 01/02/2018 If Site Rite image not attached, placement could not be confirmed due to current cardiac rhythm.   Subjective:    Overnight Issues: No significant  issues noted overnight.  Spontaneous breathing trial was unsuccessful yesterday secondary to worsening hypercapnic respiratory failure  Objective:  Vital signs for last 24 hours: Temp:  [98.9 F (37.2 C)-99.6 F (37.6 C)] 98.9 F (37.2 C) (11/07 0430) Pulse Rate:  [80-95] 80 (11/07 0700) Resp:  [17-23] 20 (11/07 0700) BP: (106-156)/(57-95) 148/85 (11/07 0700) SpO2:  [95 %-100 %] 100 % (11/07 0810) FiO2 (%):  [35 %] 35 % (11/07 0810)  Hemodynamic parameters for last 24 hours:    Intake/Output from previous day: 11/06 0701 - 11/07 0700 In: 3810.4 [I.V.:1735.6; NG/GT:1180; IV Piggyback:894.8]  Out: 2800 [Urine:2800]  Intake/Output this shift: No intake/output data recorded.  Vent settings for last 24 hours: Vent Mode: PSV;CPAP FiO2 (%):  [35 %] 35 % Set Rate:  [20 bmp] 20 bmp Vt Set:  [600 mL] 600 mL PEEP:  [5 cmH20-8 cmH20] 8 cmH20 Pressure Support:  [10 cmH20-16 cmH20] 16 cmH20 Plateau Pressure:  [29 cmH20] 29 cmH20  Physical Exam:  Vital signs: Please see the above listed vital signs HEENT: Patient is orally intubated, no accessory muscle utilization Cardiovascular: Tachycardia regular rate and rhythm Pulmonary: Diminished breath sounds Abdominal: Massive obesity Extremities: Massive lower extremity edema with superimposed cellulitis and chronic venous stasis changes Neurologic: Patient moves all extremities and is responsive  Assessment/Plan:  Acute hypercapnic respiratory failure, with 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  Patient has had recurrent hypercapnia on breathing trials.  We will continue to try this week if no improvement will need tracheostomy  Lower extremity cellulitis.  Patient is on cefepime and vancomycin, most likely chronic lymphedema from massive obesity.  Complete a 7-day course  Transaminitis.  Will follow liver function tests, repeat  Conjunctivitis. On Tobrex  Hypernatremia.  On D5W  Critical  care time 35  minutes  Seanmichael Salmons 01/06/2018  *Care during the described time interval was provided by me and/or other providers on the critical care team.  I have reviewed this patient's available data, including medical history, events of note, physical examination and test results as part of my evaluation. Patient ID: Stephanie [name], female   DOB: January 13, 1961, 57 y.o.   MRN: [id number] Patient ID: Stephanie [name], female   DOB: 01/24/1961, 57 y.o.   MRN: [id number] Patient ID: Stephanie [name], female   DOB: 1960-09-19, 58 y.o.   MRN: [id number]

## 2018-01-06 NOTE — Consult Note (Addendum)
Pharmacy Electrolyte Monitoring Consult:  Pharmacy consulted to assist in monitoring and replacing electrolytes in this 57 y.o. female admitted on 01/01/2018 with Sepsis. Patient has hypercapnic respiratory failure and lower extremity cellulitis.   Labs:  Sodium (mmol/L)  Date Value  01/06/2018 149 (H)   Potassium (mmol/L)  Date Value  01/06/2018 4.0   Magnesium (mg/dL)  Date Value  01/06/2018 2.1   Phosphorus (mg/dL)  Date Value  01/05/2018 2.5   Calcium (mg/dL)  Date Value  01/06/2018 8.6 (L)   Albumin (g/dL)  Date Value  01/06/2018 2.3 (L)   Corrected calcium = 9.96  Assessment/Plan: Electrolytes: Will replace for goal potassium ~4, goal magnesium ~ 2, and goal phosphorus > 2.5.   Patient received KCl 10 mEq IVPB x4 between 11/6 2100 and 11/7 0221. Potassium is 4 this morning. Sodium is high. Continue D5W continuous infusion. Scheduled free water 100 mL VT Q4hr.   Will order electrolytes with AM labs.  Glucose:  Patient is not diabetic. BG for last 24 hours 87-115.   Constipation: Last recorded BM 11/7. Patient started receiving Miralax 17g Daily since 11/6. Patient is intubated. Patient is receiving Pro-Stat Sugar Free 64 liquid 60 mL BID and Vital High Protein liquid 1000 mL QDay via OG tube. Will reassess 11/7.  Pharmacy will continue to monitor and adjust per consult.    Suzzanne Cloud, PharmD Candidate Pharmacy Student 01/06/2018 1:33 PM

## 2018-01-06 NOTE — Progress Notes (Signed)
Pharmacy Antibiotic Note  Stephanie [name] is a 57 y.o. female admitted on 01/01/2018 with right lower extremity cellulitis, AKI/rhabdomyolysis. Pharmacy has been consulted for Cefepime and vancomycin dosing.  Plan: Patient is currently on Day 6 for both cefepime and vancomycin regimen.   Continue cefepime 2 gm IV Q12H for a total of 7 days  Continue vancomycin 1250 mg IV q12h, continue for a total of 7 days  Pharmacy will continue to follow and adjust as needed to maintain trough 10-15 mcg/ml. Will monitor renal function daily.  Will check trough as clinically indicated.  Vancomycin Kinetics Using adjusted BW 128.8 kg, CrCl 100 mL/min Vd 90.2L ke 0.087 T1/2 8 hrs  Height: 5\' 4"  (162.6 cm) Weight: (!) 529 lb (240 kg) IBW/kg (Calculated) : 54.7 Adjusted body weight 71.72  Temp (24hrs), Avg:99.1 F (37.3 C), Min:98.7 F (37.1 C), Max:99.4 F (37.4 C)  Recent Labs  Lab 01/01/18 1649 01/01/18 1859 01/01/18 2221 01/02/18 0526 01/02/18 1810 01/03/18 0437 01/03/18 0438 01/03/18 0957 01/03/18 1849 01/04/18 0413 01/05/18 0444 01/05/18 1123 01/05/18 1800 01/06/18 0501  WBC 10.8*  --   --  13.1*  --   --  9.0  --   --   --  8.7  --   --  8.8  CREATININE 1.58*  --   --  1.13* 1.01* 0.98  --  0.78  --  0.86 0.86  --  0.89 0.81  LATICACIDVEN  --  2.3* 2.8*  --  1.4 1.2  --   --   --   --   --   --   --   --   VANCOTROUGH  --   --   --   --   --   --   --   --  22*  --   --  23*  --   --     Estimated Creatinine Clearance: 159.6 mL/min (by C-G formula based on SCr of 0.81 mg/dL).    No Known Allergies  Antimicrobials this admission: Cefepime 11/2 >> 11/8 Vancomycin 11/2 >> 11/8  Dose adjustments this admission: 11/6 Vanc 1500 mg q 12 adjusted to 1250 mg q12 11/4 Vanc 1250mg  q 8 adjusted to 1500mg  q 12  Microbiology results: 11/2 BCx: NG x 5 days  11/2 MRSA PCR: positive  Thank you for allowing pharmacy to be a part of this patient's care.   Suzzanne Cloud, PharmD  Candidate Pharmacy Student  01/06/2018 1:28 PM

## 2018-01-06 NOTE — Progress Notes (Signed)
[facility] NAME: Stephanie [name]    MR#:  [id number]  DATE OF BIRTH:  1960-03-06  SUBJECTIVE:   Patient came into the ER with frequent falls. She was found to have acute rhabdomyolysis.   Remains critically ill and on the ventilator. Patient is tolerating the ventilator wean today REVIEW OF SYSTEMS:   Review of Systems  Unable to perform ROS: Intubated   Tolerating Diet:TF Tolerating PT: intubated  DRUG ALLERGIES:  No Known Allergies  VITALS:  Blood pressure (!) 144/95, pulse 91, temperature 98.9 F (37.2 C), resp. rate (!) 23, height 5\' 4"  (1.626 m), weight (!) 240 kg, SpO2 98 %.  PHYSICAL EXAMINATION:   Physical Exam  GENERAL:  57 y.o.-year-old patient lying in the bed with no acute distress. Morbidly obese EYES: Pupils equal, round, reactive to light and accommodation. No scleral icterus. Extraocular muscles intact.  HEENT: Head atraumatic, normocephalic. Oropharynx and nasopharynx clear. Intubated on the ventilator NECK:  Supple, no jugular venous distention. No thyroid enlargement, no tenderness.  LUNGS: Normal breath sounds bilaterally, no wheezing, rales, rhonchi. No use of accessory muscles of respiration.  CARDIOVASCULAR: S1, S2 normal. No murmurs, rubs, or gallops.  ABDOMEN: Soft, nontender, nondistended. Bowel sounds present. No organomegaly or mass.  EXTREMITIES: bilateral lower extremity lymphedema NEUROLOGIC: on the ventilator PSYCHIATRIC: on the ventilator SKIN: No obvious rash, lesion, or ulcer.   LABORATORY PANEL:  CBC Recent Labs  Lab 01/06/18 0501  WBC 8.8  HGB 11.9*  HCT 41.5  PLT 220    Chemistries  Recent Labs  Lab 01/06/18 0501  NA 149*  K 4.0  CL 113*  CO2 31  GLUCOSE 115*  BUN 28*  CREATININE 0.81  CALCIUM 8.6*  MG 2.1  AST 124*  ALT 135*  ALKPHOS 38  BILITOT 1.0   Cardiac Enzymes Recent Labs  Lab 01/01/18 1649  TROPONINI 0.24*   RADIOLOGY:  Dg Chest Port 1  View  Result Date: 01/06/2018 CLINICAL DATA:  On ventilation. EXAM: PORTABLE CHEST 1 VIEW COMPARISON:  Radiographs of January 05, 2018. FINDINGS: Endotracheal and nasogastric tubes are unchanged in position. Stable cardiomegaly with central pulmonary vascular congestion. Possibly increased bibasilar opacities are noted suggesting worsening edema or atelectasis or possible effusions. No pneumothorax is noted. Bony thorax is unremarkable. IMPRESSION: Stable support apparatus. Stable cardiomegaly with central pulmonary vascular congestion. Possibly increased bibasilar opacities are noted suggesting worsening edema or atelectasis or possible effusions. Electronically Signed   By: Marijo Conception, M.D.   On: 01/06/2018 10:27   Dg Chest Port 1 View  Result Date: 01/05/2018 CLINICAL DATA:  Acute respiratory failure. EXAM: PORTABLE CHEST 1 VIEW COMPARISON:  Radiograph of January 04, 2018. FINDINGS: Stable cardiomegaly. Endotracheal and nasogastric tubes are unchanged in position. Mild central pulmonary vascular congestion is noted. No pneumothorax or definite pleural effusion is noted. No consolidative process is noted. Bony thorax is unremarkable. IMPRESSION: Stable cardiomegaly with central pulmonary vascular congestion. Stable support apparatus. Electronically Signed   By: Marijo Conception, M.D.   On: 01/05/2018 07:17   [place] Ekg Site Rite  Result Date: 01/05/2018 If Site Rite image not attached, placement could not be confirmed due to current cardiac rhythm.  ASSESSMENT AND PLAN:  Tamarah Bhullar  is a 57 y.o. female with a known history of hypertension, morbid obesity presented to the emergency room due to extreme fatigue, falls.  Patient was found to have multiple areas of skin breakdown.  Tachycardia, hypoxic  respiratory failure. She has had trouble with a chronic wound on her right leg which has gotten weaker over time.    * Sepsis secondary to right lower extremity cellulitis.   likely has chronic edema  secondary to massive obesity/chronic lymphedema. -On broad-spectrum antibiotics with vancomycin and cefepime---de-escalate antibiotics when able to. -Blood culture so far negative. -MRSA PCR positive  *Acute hypoxic respiratory failure in the setting of sepsis with obesity hypoventilation syndrome -patient was falling asleep and not able to protect her areas. She got intubated and placed on the ventilator -unable to wean at present -appreciate ICU attending input  *Acute kidney injury secondary to sepsis and Rhabdomyolysis -IV fluids.   -creat better Elevated sodium  150--149  *Hypertension.  Blood pressure in the normal range.  Hold medications.  *Elevated troponin due to rhabdomyolysis and demand ischemia No chest pain EKG shows no ST changes  *Acute rhabdomyolysis due to recurrent falls.  - IV fluids.   -CPK trending down   Case discussed with Care Management/Social Worker. Management plans discussed with the patient, family and they are in agreement.  CODE STATUS: full  DVT Prophylaxis: Lovenox  TOTAL TIME TAKING CARE OF THIS PATIENT: *30* minutes.  >50% time spent on counselling and coordination of care  POSSIBLE D/C IN ?? DAYS, DEPENDING ON CLINICAL CONDITION.  Note: This dictation was prepared with Dragon dictation along with smaller phrase technology. Any transcriptional errors that result from this process are unintentional.  Fritzi Mandes M.D on 01/06/2018 at 4:25 PM  Between 7am to 6pm - Pager - [phone]  After 6pm go to [email or web address] - password [email or web address]  [facility]  Office  [phone]  CC: Primary care physician; Patient, No Pcp PerPatient ID: Sarayu Prevost, female   DOB: 1960-05-23, 57 y.o.   MRN: [id number]

## 2018-01-07 LAB — GLUCOSE, CAPILLARY
GLUCOSE-CAPILLARY: 100 mg/dL — AB (ref 70–99)
GLUCOSE-CAPILLARY: 149 mg/dL — AB (ref 70–99)
Glucose-Capillary: 115 mg/dL — ABNORMAL HIGH (ref 70–99)
Glucose-Capillary: 122 mg/dL — ABNORMAL HIGH (ref 70–99)
Glucose-Capillary: 143 mg/dL — ABNORMAL HIGH (ref 70–99)
Glucose-Capillary: 155 mg/dL — ABNORMAL HIGH (ref 70–99)

## 2018-01-07 LAB — BLOOD GAS, ARTERIAL
Acid-Base Excess: 1.5 mmol/L (ref 0.0–2.0)
Allens test (pass/fail): POSITIVE — AB
Bicarbonate: 29.2 mmol/L — ABNORMAL HIGH (ref 20.0–28.0)
Delivery systems: POSITIVE
Expiratory PAP: 5
FIO2: 30
Inspiratory PAP: 10
Mechanical Rate: 10
O2 Saturation: 93.7 %
Patient temperature: 37
pCO2 arterial: 58 mmHg — ABNORMAL HIGH (ref 32.0–48.0)
pH, Arterial: 7.31 — ABNORMAL LOW (ref 7.350–7.450)
pO2, Arterial: 76 mmHg — ABNORMAL LOW (ref 83.0–108.0)

## 2018-01-07 LAB — BASIC METABOLIC PANEL
ANION GAP: 2 — AB (ref 5–15)
BUN: 28 mg/dL — ABNORMAL HIGH (ref 6–20)
CALCIUM: 8.6 mg/dL — AB (ref 8.9–10.3)
CO2: 32 mmol/L (ref 22–32)
CREATININE: 0.71 mg/dL (ref 0.44–1.00)
Chloride: 112 mmol/L — ABNORMAL HIGH (ref 98–111)
GFR calc Af Amer: >60 mL/min (ref >60)
GFR calc non Af Amer: >60 mL/min (ref >60)
GLUCOSE: 126 mg/dL — AB (ref 70–99)
Potassium: 3.7 mmol/L (ref 3.5–5.1)
Sodium: 146 mmol/L — ABNORMAL HIGH (ref 135–145)

## 2018-01-07 LAB — CBC
HEMATOCRIT: 37.6 % (ref 36.0–46.0)
Hemoglobin: 11.1 g/dL — ABNORMAL LOW (ref 12.0–15.0)
MCH: 33 pg (ref 26.0–34.0)
MCHC: 29.5 g/dL — ABNORMAL LOW (ref 30.0–36.0)
MCV: 111.9 fL — AB (ref 80.0–100.0)
PLATELETS: 230 10*3/uL (ref 150–400)
RBC: 3.36 MIL/uL — ABNORMAL LOW (ref 3.87–5.11)
RDW: 16.8 % — ABNORMAL HIGH (ref 11.5–15.5)
WBC: 8.7 10*3/uL (ref 4.0–10.5)
nRBC: 0 % (ref 0.0–0.2)

## 2018-01-07 MED ORDER — ORAL CARE MOUTH RINSE
15.0000 mL | Freq: Two times a day (BID) | OROMUCOSAL | Status: DC
  Administered 2018-01-08 – 2018-01-20 (×15): 15 mL via OROMUCOSAL

## 2018-01-07 MED ORDER — METHYLPREDNISOLONE SODIUM SUCC 125 MG IJ SOLR
60.0000 mg | Freq: Four times a day (QID) | INTRAMUSCULAR | Status: DC
  Administered 2018-01-07 – 2018-01-09 (×9): 60 mg via INTRAVENOUS
  Filled 2018-01-07 (×9): qty 2

## 2018-01-07 MED ORDER — FENTANYL CITRATE (PF) 100 MCG/2ML IJ SOLN: 25.0000 ug | Freq: Once | INTRAMUSCULAR | Status: DC

## 2018-01-07 MED ORDER — POTASSIUM CHLORIDE 20 MEQ PO PACK: 40.0000 meq | PACK | Freq: Once | ORAL | Status: DC

## 2018-01-07 MED ORDER — IPRATROPIUM-ALBUTEROL 0.5-2.5 (3) MG/3ML IN SOLN
3.0000 mL | Freq: Once | RESPIRATORY_TRACT | Status: AC
  Administered 2018-01-07: 3 mL via RESPIRATORY_TRACT

## 2018-01-07 MED ORDER — SODIUM CHLORIDE 0.9 % IV SOLN
INTRAVENOUS | Status: DC | PRN
  Administered 2018-01-07: 500 mL via INTRAVENOUS

## 2018-01-07 MED ORDER — CHLORHEXIDINE GLUCONATE 0.12 % MT SOLN
15.0000 mL | Freq: Two times a day (BID) | OROMUCOSAL | Status: DC
  Administered 2018-01-08 – 2018-01-21 (×25): 15 mL via OROMUCOSAL
  Filled 2018-01-07 (×25): qty 15

## 2018-01-07 MED ORDER — POTASSIUM CHLORIDE 10 MEQ/100ML IV SOLN
10.0000 meq | INTRAVENOUS | Status: DC
  Administered 2018-01-07: 10 meq via INTRAVENOUS
  Filled 2018-01-07 (×3): qty 100

## 2018-01-07 MED ORDER — POTASSIUM CHLORIDE 10 MEQ/100ML IV SOLN
10.0000 meq | INTRAVENOUS | Status: AC
  Administered 2018-01-07 (×2): 10 meq via INTRAVENOUS
  Filled 2018-01-07 (×3): qty 100

## 2018-01-07 NOTE — Progress Notes (Signed)
Nutrition Follow Up Note   DOCUMENTATION CODES:   Morbid obesity  INTERVENTION:   RD will order supplements when diet advanced   NUTRITION DIAGNOSIS:   Inadequate oral intake related to inability to eat as evidenced by NPO status.  GOAL:   Patient will meet greater than or equal to 90% of their needs  MONITOR:   Diet advancement, Labs, Weight trends, Skin, I & O's  ASSESSMENT:   57 year old female with PMHx of morbid obesity, HTN who is admitted with acute hypercapnic respiratory failure with probable obesity hypoventilation syndrome requiring intubation on 11/3, lower extremity cellulitis.   Pt extubated today; on bipap. RD will monitor for diet advancement. Pt with ~20lb weight gain since admit; RD will continue to monitor.   Medications reviewed and include: lovenox, pepcid, solu-medrol, MVI, miralax, cefepime, 5% dextrose @50ml /hr, vancomycin  Labs reviewed: Na 146(H), Cl 112(H), BUN 28(H)  Diet Order:   Diet Order            Diet NPO time specified  Diet effective now             EDUCATION NEEDS:   No education needs have been identified at this time  Skin:  Skin Assessment: Skin Integrity Issues:(venous stasis ulcer right leg; MSAD to abdomen and groin)  Last BM:  11/8- type 6  Height:   Ht Readings from Last 1 Encounters:  01/01/18 5\' 4"  (1.626 m)   Weight:   Wt Readings from Last 1 Encounters:  01/07/18 (!) 235 kg   Ideal Body Weight:  54.5 kg  BMI:  Body mass index is 88.93 kg/m.  Estimated Nutritional Needs:   Kcal:  2800-3100kcal/day   Protein:  >136g/day   Fluid:  >1.6L/day   Koleen Distance MS, RD, LDN Pager #- [phone] Office#- [phone] After Hours Pager: [phone]

## 2018-01-07 NOTE — Progress Notes (Signed)
Called [facility], patient states that she wants to go on bipap

## 2018-01-07 NOTE — Progress Notes (Addendum)
Pharmacy Antibiotic Note  Stephanie [name] is a 57 y.o. female admitted on 01/01/2018 with right lower extremity cellulitis, AKI/rhabdomyolysis. Pharmacy has been consulted for Cefepime and vancomycin dosing.  Plan: Patient is currently on Day 7 for both cefepime and vancomycin regimen. Today is the last day of therapy. Patient completed cefepime and vancomycin course on 11/08.    Vancomycin Kinetics Using adjusted BW 128.8 kg, CrCl 100 mL/min Vd 90.2L ke 0.087 T1/2 8 hrs  Height: 5\' 4"  (162.6 cm) Weight: (!) 518 lb 1.3 oz (235 kg) IBW/kg (Calculated) : 54.7 Adjusted body weight 71.72  Temp (24hrs), Avg:98.9 F (37.2 C), Min:98.1 F (36.7 C), Max:99.3 F (37.4 C)  Recent Labs  Lab 01/01/18 1859 01/01/18 2221 01/02/18 0526 01/02/18 1810 01/03/18 0437 01/03/18 0438  01/03/18 1849 01/04/18 0413 01/05/18 0444 01/05/18 1123 01/05/18 1800 01/06/18 0501 01/07/18 0413  WBC  --   --  13.1*  --   --  9.0  --   --   --  8.7  --   --  8.8 8.7  CREATININE  --   --  1.13* 1.01* 0.98  --    < >  --  0.86 0.86  --  0.89 0.81 0.71  LATICACIDVEN 2.3* 2.8*  --  1.4 1.2  --   --   --   --   --   --   --   --   --   VANCOTROUGH  --   --   --   --   --   --   --  22*  --   --  23*  --   --   --    < > = values in this interval not displayed.    Estimated Creatinine Clearance: 159.1 mL/min (by C-G formula based on SCr of 0.71 mg/dL).    No Known Allergies  Antimicrobials this admission: Cefepime 11/2 >> 11/8 Vancomycin 11/2 >> 11/8  Dose adjustments this admission: 11/6 Vanc 1500 mg q 12 adjusted to 1250 mg q12 11/4 Vanc 1250mg  q 8 adjusted to 1500mg  q 12  Microbiology results: 11/2 BCx: NG x 5 days  11/2 MRSA PCR: positive  Thank you for allowing pharmacy to be a part of this patient's care.   Suzzanne Cloud,  Pharmacy Student  01/07/2018 11:40 AM

## 2018-01-07 NOTE — Consult Note (Signed)
Pharmacy Electrolyte Monitoring Consult:  Pharmacy consulted to assist in monitoring and replacing electrolytes in this 57 y.o. female admitted on 01/01/2018 with Sepsis. Patient has hypercapnic respiratory failure and lower extremity cellulitis.   Labs:  Sodium (mmol/L)  Date Value  01/07/2018 146 (H)   Potassium (mmol/L)  Date Value  01/07/2018 3.7   Magnesium (mg/dL)  Date Value  01/06/2018 2.1   Phosphorus (mg/dL)  Date Value  01/05/2018 2.5   Calcium (mg/dL)  Date Value  01/07/2018 8.6 (L)   Albumin (g/dL)  Date Value  01/06/2018 2.3 (L)   Corrected calcium = 9.96  Assessment/Plan: Electrolytes: Will replace for goal potassium ~4, goal magnesium ~ 2, and goal phosphorus > 2.5.   Ordered KCl 40 mEq VT x 1. Decreased D5W from 100 mL/hr to 50 mL/hr per CCM discussion.   Will order electrolytes with AM labs.  Glucose:  Patient is not diabetic. BG for last 24 hours 91-143. New initiation of Methylprednisolone 60 mg IV q6h.  Constipation: Last recorded BM 11/7. Patient receiving Miralax 17g Daily.   Pharmacy will continue to monitor and adjust per consult.     Paticia Stack, PharmD Pharmacy Resident  01/07/2018 11:47 AM

## 2018-01-07 NOTE — Progress Notes (Signed)
Follow up - Critical Care Medicine Note  Patient Details:    Stephanie [name] is an 57 y.o. female. with history of morbid obesity hypertension.  Patient presented to the ER with lethargy generalized weakness and frequent falls recently.  She was found to have cellulitis of the right lower extremities with acute kidney injury and rhabdomyolysis.  Patient had deterioration of respiratory status requiring intubation  Lines, Airways, Drains: Airway 8 mm (Active)  Secured at (cm) 25 cm 01/03/2018 11:30 AM  Measured From Lips 01/03/2018 11:30 AM  Secured Location Right 01/03/2018  8:00 AM  Secured By [employer] 01/03/2018 11:30 AM  Tube Holder Repositioned Yes 01/03/2018  2:16 AM  Cuff Pressure (cm H2O) 28 cm H2O 01/03/2018  8:00 AM  Site Condition Dry 01/03/2018 11:30 AM     CVC Triple Lumen 01/02/18 Right Internal jugular 25 cm 3 cm (Active)  Indication for Insertion or Continuance of Line Limited venous access - need for IV therapy >5 days (PICC only) 01/03/2018  8:00 AM  Site Assessment Clean;Dry;Intact 01/03/2018  8:00 AM  Proximal Lumen Status Infusing 01/03/2018  8:00 AM  Medial Lumen Status Saline locked 01/03/2018  8:00 AM  Distal Lumen Status Infusing 01/03/2018  8:00 AM  Dressing Type Transparent;Occlusive 01/03/2018  8:00 AM  Dressing Status Clean;Dry;Intact;Antimicrobial disc in place 01/03/2018  8:00 AM  Line Care Connections checked and tightened 01/03/2018  8:00 AM  Dressing Intervention New dressing;Dressing changed;Antimicrobial disc changed 01/02/2018  7:00 PM  Dressing Change Due 01/09/18 01/03/2018  8:00 AM     NG/OG Tube Orogastric Center mouth Xray Documented cm marking at nare/ corner of mouth 65 cm (Active)  Cm Marking at Nare/Corner of Mouth (if applicable) 65 cm 27/0/6237  8:00 AM  Site Assessment Clean;Dry;Intact 01/03/2018  8:00 AM  Ongoing Placement Verification No change in respiratory status;No change in cm markings or external length of tube from initial  placement;Xray 01/03/2018  8:00 AM  Status Infusing tube feed 01/03/2018  8:00 AM  Amount of suction 100 mmHg 01/03/2018  8:00 AM  Drainage Appearance Brown 01/03/2018  8:00 AM  Intake (mL) 100 mL 01/03/2018 10:00 AM     Urethral Catheter Jobe Marker, RN Double-lumen 16 Fr. (Active)  Indication for Insertion or Continuance of Catheter Unstable critical patients (first 24-48 hours) 01/03/2018  8:00 AM  Site Assessment Intact 01/03/2018  8:00 AM  Catheter Maintenance Bag below level of bladder;Catheter secured;Drainage bag/tubing not touching floor;No dependent loops;Seal intact 01/03/2018  8:00 AM  Collection Container Standard drainage bag 01/03/2018  8:00 AM  Securement Method Securing device (Describe) 01/03/2018  8:00 AM  Urinary Catheter Interventions Unclamped 01/03/2018  8:00 AM  Output (mL) 100 mL 01/03/2018  8:00 AM    Anti-infectives:  Anti-infectives (From admission, onward)   Start     Dose/Rate Route Frequency Ordered Stop   01/06/18 0000  vancomycin (VANCOCIN) 1,250 mg in sodium chloride 0.9 % 250 mL IVPB     1,250 mg 166.7 mL/hr over 90 Minutes Intravenous Every 12 hours 01/05/18 1210     01/04/18 0000  vancomycin (VANCOCIN) 1,500 mg in sodium chloride 0.9 % 500 mL IVPB  Status:  Discontinued     1,500 mg 250 mL/hr over 120 Minutes Intravenous Every 12 hours 01/03/18 2021 01/05/18 1210   01/02/18 2000  vancomycin (VANCOCIN) 1,250 mg in sodium chloride 0.9 % 250 mL IVPB  Status:  Discontinued     1,250 mg 166.7 mL/hr over 90 Minutes Intravenous Every 8 hours 01/02/18 1204  01/03/18 2021   01/02/18 0000  vancomycin (VANCOCIN) 1,750 mg in sodium chloride 0.9 % 500 mL IVPB  Status:  Discontinued     1,750 mg 250 mL/hr over 120 Minutes Intravenous Every 12 hours 01/01/18 2118 01/02/18 1204   01/01/18 2130  vancomycin (VANCOCIN) IVPB 750 mg/150 ml premix     750 mg 150 mL/hr over 60 Minutes Intravenous  Once 01/01/18 2116 01/01/18 2339   01/01/18 1830  ceFEPIme (MAXIPIME) 2 g in sodium  chloride 0.9 % 100 mL IVPB     2 g 200 mL/hr over 30 Minutes Intravenous Every 12 hours 01/01/18 1826     01/01/18 1730  vancomycin (VANCOCIN) IVPB 1000 mg/200 mL premix     1,000 mg 200 mL/hr over 60 Minutes Intravenous  Once 01/01/18 1726 01/01/18 1917   01/01/18 1730  cefTRIAXone (ROCEPHIN) 2 g in sodium chloride 0.9 % 100 mL IVPB  Status:  Discontinued     2 g 200 mL/hr over 30 Minutes Intravenous Every 24 hours 01/01/18 1726 01/01/18 1814      Microbiology: Results for orders placed or performed during the hospital encounter of 01/01/18  Blood Culture (routine x 2)     Status: None   Collection Time: 01/01/18  5:09 PM  Result Value Ref Range Status   Specimen Description BLOOD LT [facility]  Final   Special Requests   Final    BOTTLES DRAWN AEROBIC AND ANAEROBIC Blood Culture results may not be optimal due to an excessive volume of blood received in culture bottles   Culture   Final    NO GROWTH 5 DAYS Performed at [facility], [address] [place], [place] [address]    Report Status 01/06/2018 FINAL  Final  Blood Culture (routine x 2)     Status: None   Collection Time: 01/01/18  5:09 PM  Result Value Ref Range Status   Specimen Description BLOOD LT HAND  Final   Special Requests   Final    BOTTLES DRAWN AEROBIC AND ANAEROBIC Blood Culture adequate volume   Culture   Final    NO GROWTH 5 DAYS Performed at [facility], [address] [place], [place] [address]    Report Status 01/06/2018 FINAL  Final  MRSA PCR Screening     Status: Abnormal   Collection Time: 01/01/18  9:58 PM  Result Value Ref Range Status   MRSA by PCR POSITIVE (A) NEGATIVE Final    Comment:        The GeneXpert MRSA Assay (FDA approved for NASAL specimens only), is one component of a comprehensive MRSA colonization surveillance program. It is not intended to diagnose MRSA infection nor to guide or monitor treatment for MRSA infections. RESULT CALLED TO, READ BACK BY AND  VERIFIED WITH: FELICIA PREDUHOMME ON [birth year] AT [address] [facility] Performed at [facility], [address] [place], [place] [address]   Studies: Dg Abd 1 View  Result Date: 01/02/2018 CLINICAL DATA:  NG tube placement EXAM: ABDOMEN - 1 VIEW COMPARISON:  None. FINDINGS: Limited exam secondary to body habitus. NG tube appears within stomach IMPRESSION: NG tube within stomach. Electronically Signed   By: Suzy Bouchard M.D.   On: 01/02/2018 12:40   Dg Chest Port 1 View  Result Date: 01/06/2018 CLINICAL DATA:  On ventilation. EXAM: PORTABLE CHEST 1 VIEW COMPARISON:  Radiographs of January 05, 2018. FINDINGS: Endotracheal and nasogastric tubes are unchanged in position. Stable cardiomegaly with central pulmonary vascular congestion. Possibly increased bibasilar  opacities are noted suggesting worsening edema or atelectasis or possible effusions. No pneumothorax is noted. Bony thorax is unremarkable. IMPRESSION: Stable support apparatus. Stable cardiomegaly with central pulmonary vascular congestion. Possibly increased bibasilar opacities are noted suggesting worsening edema or atelectasis or possible effusions. Electronically Signed   By: Marijo Conception, M.D.   On: 01/06/2018 10:27   Dg Chest Port 1 View  Result Date: 01/05/2018 CLINICAL DATA:  Acute respiratory failure. EXAM: PORTABLE CHEST 1 VIEW COMPARISON:  Radiograph of January 04, 2018. FINDINGS: Stable cardiomegaly. Endotracheal and nasogastric tubes are unchanged in position. Mild central pulmonary vascular congestion is noted. No pneumothorax or definite pleural effusion is noted. No consolidative process is noted. Bony thorax is unremarkable. IMPRESSION: Stable cardiomegaly with central pulmonary vascular congestion. Stable support apparatus. Electronically Signed   By: Marijo Conception, M.D.   On: 01/05/2018 07:17   Dg Chest Port 1 View  Result Date: 01/04/2018 CLINICAL DATA:  Intubated patient with acute kidney injury, rhabdomyolysis  and hypertension. EXAM: PORTABLE CHEST 1 VIEW COMPARISON:  Single-view of the chest 01/03/2018 and 01/02/2018. FINDINGS: The patient's right IJ catheter appears to have backed out with the tip now in the distal right internal jugular vein near its confluence with the right subclavian vein. However, visualization could be limited. Endotracheal tube and NG tube are again seen. There is cardiomegaly and vascular congestion. Mild bibasilar atelectasis is noted. IMPRESSION: Right IJ catheter appears to have backed out with its tip now projecting in the distal right internal jugular vein. Visualization may be limited on this portable examination given the patient's habitus. Cardiomegaly and pulmonary vascular congestion. Mild bibasilar airspace disease, likely atelectasis. Electronically Signed   By: Inge Rise M.D.   On: 01/04/2018 11:20   Dg Chest Port 1 View  Result Date: 01/03/2018 CLINICAL DATA:  Sepsis, atelectasis. EXAM: PORTABLE CHEST 1 VIEW COMPARISON:  Portable chest x-ray of January 02, 2018. FINDINGS: The lungs are adequately inflated. The interstitial markings are increased. The pulmonary vascularity is engorged. The cardiac silhouette is enlarged. There is calcification in the wall of the aortic arch. The endotracheal tube tip projects approximately 3.3 cm above the carina. The esophagogastric tube tip is difficult to discern but appears to project below the GE junction. The right internal jugular venous catheter tip projects over the proximal SVC. The observed bony thorax is unremarkable. IMPRESSION: Patchy airspace opacities as well as interstitial prominence bilaterally likely reflects CHF and interstitial edema. One cannot absolutely exclude pneumonia. The endotracheal tube tip is in reasonable position. The esophagogastric tube tip cannot be clearly discerned. Thoracic aortic atherosclerosis. Electronically Signed   By: David  Martinique M.D.   On: 01/03/2018 07:42   Dg Chest Port 1  View  Result Date: 01/02/2018 CLINICAL DATA:  Check central line placement EXAM: PORTABLE CHEST 1 VIEW COMPARISON:  01/02/2018 FINDINGS: Right jugular central line is better appreciated on this exam extending into at least the mid superior vena cava. The more distal aspect is not as well visualized again due to patient's body habitus. Cardiac shadow remains enlarged. Endotracheal tube and nasogastric catheter are again noted in satisfactory position. Vascular congestion is again noted. No evidence of pneumothorax. IMPRESSION: Right jugular central line extending into the SVC. No pneumothorax is noted. Electronically Signed   By: Inez Catalina M.D.   On: 01/02/2018 19:21   Dg Chest Port 1 View  Result Date: 01/02/2018 CLINICAL DATA:  Check central line placement EXAM: PORTABLE CHEST 1 VIEW COMPARISON:  01/02/2018 FINDINGS: The  examination is significantly limited by patient's body habitus. Previously seen central line is faintly visualized although the tip is not discretely seen. Endotracheal tube and nasogastric catheter are again noted. The lungs are again well aerated with mild vascular congestion. No pneumothorax is noted. IMPRESSION: Stable appearance of the chest. The right jugular central line is not well appreciated on this study in part due to patient body habitus as well as motion. Changes of vascular congestion are again seen Electronically Signed   By: Inez Catalina M.D.   On: 01/02/2018 14:54   Dg Chest Port 1 View  Result Date: 01/02/2018 CLINICAL DATA:  Central line placement. EXAM: PORTABLE CHEST 1 VIEW COMPARISON:  Chest x-ray from same day at 11:55 a.m. FINDINGS: Endotracheal tube tip approximately 1.8 cm above the carina. Unchanged enteric tube. Interval placement of a right internal jugular central venous catheter with the tip near the confluence of the internal jugular and brachiocephalic veins. Stable cardiomegaly and mild interstitial edema. Unchanged retrocardiac opacification and  probable left pleural effusion. The right lung is clear. No pneumothorax. No acute osseous abnormality. IMPRESSION: 1. Right internal jugular central venous catheter with tip near the confluence of the internal jugular and brachiocephalic veins. Consider advancing or exchanging for a longer catheter. 2. No pneumothorax. 3. Stable congestive heart failure and retrocardiac airspace disease, likely atelectasis. Electronically Signed   By: Titus Dubin M.D.   On: 01/02/2018 13:27   Dg Chest Port 1 View  Result Date: 01/02/2018 CLINICAL DATA:  Endotracheal tube and OG tube placement. EXAM: PORTABLE CHEST 1 VIEW COMPARISON:  One-view chest x-ray 01/01/2018 FINDINGS: Heart is enlarged. Mild edema has slightly increased. Retrocardiac opacification is present. No significant right-sided effusion is present. The patient has been intubated. The endotracheal tube terminates 2.5 cm above the carina. OG tube courses off the inferior border of the film. IMPRESSION: 1. Intubation. The endotracheal tube terminates 2.5 cm above the carina. 2. Cardiomegaly with increasing interstitial edema and congestive heart failure. 3. Retrocardiac airspace disease likely reflects atelectasis. Electronically Signed   By: San Morelle M.D.   On: 01/02/2018 12:38   Dg Chest Port 1 View  Result Date: 01/01/2018 CLINICAL DATA:  Fall.  Morbid obesity. EXAM: PORTABLE CHEST 1 VIEW COMPARISON:  None. FINDINGS: The study is limited by the low volume portable technique in positioning. There appears to be significant enlarged of the cardiac silhouette which may represent cardiomegaly. No pneumothorax. No nodules or masses. No obvious infiltrates. The left retrocardiac region is not well assessed. IMPRESSION: 1. Enlargement of the cardiac silhouette. The study is limited but no focal infiltrate noted. The left retrocardiac region in particular is not well evaluated. Electronically Signed   By: Dorise Bullion III M.D   On: 01/01/2018 19:07    Dg Tibia/fibula Right Port  Result Date: 01/01/2018 CLINICAL DATA:  Fall. EXAM: PORTABLE RIGHT TIBIA AND FIBULA - 2 VIEW COMPARISON:  None. FINDINGS: The study is limited due to poor penetration due to the patient's body habitus. No definitive fractures noted. IMPRESSION: Limited study due to body habitus.  No obvious fractures. Electronically Signed   By: Dorise Bullion III M.D   On: 01/01/2018 19:08   [place] Ekg Site Rite  Result Date: 01/05/2018 If Site Rite image not attached, placement could not be confirmed due to current cardiac rhythm.  [place] Ekg Site Rite  Result Date: 01/02/2018 If Site Rite image not attached, placement could not be confirmed due to current cardiac rhythm.   Subjective:  Overnight Issues: No significant issues noted overnight.  Doing well.  Will perform spontaneous awakening and breathing trial and assess for extubation straight to BiPAP  Objective:  Vital signs for last 24 hours: Temp:  [98.1 F (36.7 C)-99.3 F (37.4 C)] 99.3 F (37.4 C) (11/08 0400) Pulse Rate:  [81-151] 87 (11/08 0700) Resp:  [17-27] 20 (11/08 0700) BP: (100-155)/(57-103) 154/73 (11/08 0700) SpO2:  [93 %-100 %] 99 % (11/08 0700) FiO2 (%):  [30 %-35 %] 30 % (11/08 0734) Weight:  [235 kg] 235 kg (11/08 0500)  Hemodynamic parameters for last 24 hours:    Intake/Output from previous day: 11/07 0701 - 11/08 0700 In: 4327 [I.V.:2344.7; NG/GT:1260; IV Piggyback:722.3] Out: 2600 [Urine:2600]  Intake/Output this shift: No intake/output data recorded.  Vent settings for last 24 hours: Vent Mode: PSV;CPAP FiO2 (%):  [30 %-35 %] 30 % Set Rate:  [20 bmp] 20 bmp Vt Set:  [600 mL] 600 mL PEEP:  [5 cmH20] 5 cmH20 Pressure Support:  [10 cmH20] 10 cmH20 Plateau Pressure:  [22 cmH20-26 cmH20] 23 cmH20  Physical Exam:  Vital signs: Please see the above listed vital signs HEENT: Patient is orally intubated, no accessory muscle utilization Cardiovascular: Tachycardia regular rate and  rhythm Pulmonary: Diminished breath sounds Abdominal: Massive obesity Extremities: Massive lower extremity edema with superimposed cellulitis and chronic venous stasis changes Neurologic: Patient moves all extremities and is responsive  Assessment/Plan:  Acute hypercapnic respiratory failure, with 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  Patient has had recurrent hypercapnia on breathing trials.  We will continue to try this week if no improvement will need tracheostomy  Lower extremity cellulitis.  Patient is on cefepime and vancomycin, most likely chronic lymphedema from massive obesity.  Complete a 7-day course  Transaminitis.  Improving  Conjunctivitis. On Tobrex  Hypernatremia.  On D5W.  Sodium 146  Critical  care time 35 minutes  Stephanie [name] 01/07/2018  *Care during the described time interval was provided by me and/or other providers on the critical care team.  I have reviewed this patient's available data, including medical history, events of note, physical examination and test results as part of my evaluation. Patient ID: Vanesha Athens, female   DOB: [birth year], 57 y.o.   MRN: [id number] Patient ID: Ashlan Dignan, female   DOB: 1961-02-15, 57 y.o.   MRN: [id number] Patient ID: Marshay Slates, female   DOB: Dec 28, 1960, 57 y.o.   MRN: [id number] Patient ID: Guyla Bless, female   DOB: [birth year], 57 y.o.   MRN: [id number]

## 2018-01-07 NOTE — Progress Notes (Signed)
[facility] [place] NAME: Stephanie [name]    MR#:  [id number]  DATE OF BIRTH:  Nov 21, 1960  SUBJECTIVE:   Patient came into the ER with frequent falls. She was found to have acute rhabdomyolysis.   Now extubated however currently on BiPAP unable to talk REVIEW OF SYSTEMS:   Review of Systems  Unable to perform ROS: Intubated   Tolerating Diet:TF Tolerating PT:on bipap  DRUG ALLERGIES:  No Known Allergies  VITALS:  Blood pressure (!) 160/81, pulse (!) 105, temperature 99.3 F (37.4 C), temperature source Oral, resp. rate (!) 26, height 5\' 4"  (1.626 m), weight (!) 235 kg, SpO2 97 %.  PHYSICAL EXAMINATION:   Physical Exam  GENERAL:  57 y.o.-year-old patient lying in the bed with no acute distress. Morbidly obese EYES: Pupils equal, round, reactive to light and accommodation. No scleral icterus. Extraocular muscles intact.  HEENT: Head atraumatic, normocephalic. Oropharynx and nasopharynx clear. BIPAP+NECK:  Supple, no jugular venous distention. No thyroid enlargement, no tenderness.  LUNGS: Normal breath sounds bilaterally, no wheezing, rales, rhonchi. No use of accessory muscles of respiration.  CARDIOVASCULAR: S1, S2 normal. No murmurs, rubs, or gallops.  ABDOMEN: Soft, nontender, nondistended. Bowel sounds present. No organomegaly or mass.  EXTREMITIES: bilateral lower extremity lymphedema NEUROLOGIC: overall weak but no gross focal deficit PSYCHIATRIC: awakens on VC SKIN: as above  LABORATORY PANEL:  CBC Recent Labs  Lab 01/07/18 0413  WBC 8.7  HGB 11.1*  HCT 37.6  PLT 230    Chemistries  Recent Labs  Lab 01/06/18 0501 01/07/18 0413  NA 149* 146*  K 4.0 3.7  CL 113* 112*  CO2 31 32  GLUCOSE 115* 126*  BUN 28* 28*  CREATININE 0.81 0.71  CALCIUM 8.6* 8.6*  MG 2.1  --   AST 124*  --   ALT 135*  --   ALKPHOS 38  --   BILITOT 1.0  --    Cardiac Enzymes Recent Labs  Lab 01/01/18 1649  TROPONINI  0.24*   RADIOLOGY:  Dg Chest Port 1 View  Result Date: 01/06/2018 CLINICAL DATA:  On ventilation. EXAM: PORTABLE CHEST 1 VIEW COMPARISON:  Radiographs of January 05, 2018. FINDINGS: Endotracheal and nasogastric tubes are unchanged in position. Stable cardiomegaly with central pulmonary vascular congestion. Possibly increased bibasilar opacities are noted suggesting worsening edema or atelectasis or possible effusions. No pneumothorax is noted. Bony thorax is unremarkable. IMPRESSION: Stable support apparatus. Stable cardiomegaly with central pulmonary vascular congestion. Possibly increased bibasilar opacities are noted suggesting worsening edema or atelectasis or possible effusions. Electronically Signed   By: Marijo Conception, M.D.   On: 01/06/2018 10:27   ASSESSMENT AND PLAN:  Stephanie [name]  is a 57 y.o. female with a known history of hypertension, morbid obesity presented to the emergency room due to extreme fatigue, falls.  Patient was found to have multiple areas of skin breakdown.  Tachycardia, hypoxic respiratory failure. She has had trouble with a chronic wound on her right leg which has gotten weaker over time.    * Sepsis secondary to right lower extremity cellulitis.   likely has chronic edema secondary to massive obesity/chronic lymphedema. -On broad-spectrum antibiotics with vancomycin and cefepime---de-escalate antibiotics when able to. -Blood culture so far negative. -MRSA PCR positive  *Acute hypoxic respiratory failure in the setting of sepsis with obesity hypoventilation syndrome -patient was falling asleep and not able to protect her areas. She got intubated and placed on the ventilator--now extubated -BIPAP  As needed -appreciate ICU attending input  *Acute kidney injury secondary to sepsis and Rhabdomyolysis -IV fluids.   -creat better Elevated sodium  150--149--146  *Hypertension.  Blood pressure in the normal range.  Hold medications.  *Elevated troponin due to  rhabdomyolysis and demand ischemia No chest pain EKG shows no ST changes  *Acute rhabdomyolysis due to recurrent falls.  - IV fluids.   -CPK trending down   Case discussed with Care Management/Social Worker. Management plans discussed with the patient, family and they are in agreement.  CODE STATUS: full  DVT Prophylaxis: Lovenox  TOTAL TIME TAKING CARE OF THIS PATIENT: *30* minutes.  >50% time spent on counselling and coordination of care  POSSIBLE D/C IN ?? DAYS, DEPENDING ON CLINICAL CONDITION.  Note: This dictation was prepared with Dragon dictation along with smaller phrase technology. Any transcriptional errors that result from this process are unintentional.  Fritzi Mandes M.D on 01/07/2018 at 2:23 PM  Between 7am to 6pm - Pager - [phone]  After 6pm go to [email or web address] - password [email or web address]  [facility]  Office  [phone]  CC: Primary care physician; Patient, No Pcp PerPatient ID: Stephanie [name], female   DOB: Jul 30, 1960, 57 y.o.   MRN: [id number]

## 2018-01-08 LAB — GLUCOSE, CAPILLARY
GLUCOSE-CAPILLARY: 132 mg/dL — AB (ref 70–99)
GLUCOSE-CAPILLARY: 143 mg/dL — AB (ref 70–99)
GLUCOSE-CAPILLARY: 140 mg/dL — AB (ref 70–99)
Glucose-Capillary: 140 mg/dL — ABNORMAL HIGH (ref 70–99)
Glucose-Capillary: 148 mg/dL — ABNORMAL HIGH (ref 70–99)
Glucose-Capillary: 152 mg/dL — ABNORMAL HIGH (ref 70–99)
Glucose-Capillary: 154 mg/dL — ABNORMAL HIGH (ref 70–99)

## 2018-01-08 LAB — PHOSPHORUS: PHOSPHORUS: 3.9 mg/dL (ref 2.5–4.6)

## 2018-01-08 LAB — BASIC METABOLIC PANEL
ANION GAP: 5 (ref 5–15)
BUN: 28 mg/dL — ABNORMAL HIGH (ref 6–20)
CALCIUM: 9.1 mg/dL (ref 8.9–10.3)
CO2: 31 mmol/L (ref 22–32)
CREATININE: 0.79 mg/dL (ref 0.44–1.00)
Chloride: 109 mmol/L (ref 98–111)
Glucose, Bld: 155 mg/dL — ABNORMAL HIGH (ref 70–99)
Potassium: 4.4 mmol/L (ref 3.5–5.1)
Sodium: 145 mmol/L (ref 135–145)

## 2018-01-08 LAB — MAGNESIUM: Magnesium: 2.2 mg/dL (ref 1.7–2.4)

## 2018-01-08 MED ORDER — RACEPINEPHRINE HCL 2.25 % IN NEBU
0.5000 mL | INHALATION_SOLUTION | Freq: Once | RESPIRATORY_TRACT | Status: AC
  Administered 2018-01-08: 0.5 mL via RESPIRATORY_TRACT
  Filled 2018-01-08: qty 0.5

## 2018-01-08 MED ORDER — RACEPINEPHRINE HCL 2.25 % IN NEBU
0.5000 mL | INHALATION_SOLUTION | Freq: Once | RESPIRATORY_TRACT | Status: AC
  Administered 2018-01-08: 0.5 mL via RESPIRATORY_TRACT

## 2018-01-08 MED ORDER — AMLODIPINE BESYLATE 10 MG PO TABS
10.0000 mg | ORAL_TABLET | Freq: Every day | ORAL | Status: DC
  Administered 2018-01-08 – 2018-01-21 (×12): 10 mg via ORAL
  Filled 2018-01-08 (×13): qty 1

## 2018-01-08 MED ORDER — RACEPINEPHRINE HCL 2.25 % IN NEBU
INHALATION_SOLUTION | RESPIRATORY_TRACT | Status: AC
  Filled 2018-01-08: qty 0.5

## 2018-01-08 NOTE — Consult Note (Signed)
Pharmacy Electrolyte Monitoring Consult:  Pharmacy consulted to assist in monitoring and replacing electrolytes in this 57 y.o. female admitted on 01/01/2018 with Sepsis. Patient has hypercapnic respiratory failure and lower extremity cellulitis.   Labs:  Sodium (mmol/L)  Date Value  01/08/2018 145   Potassium (mmol/L)  Date Value  01/08/2018 4.4   Magnesium (mg/dL)  Date Value  01/08/2018 2.2   Phosphorus (mg/dL)  Date Value  01/08/2018 3.9   Calcium (mg/dL)  Date Value  01/08/2018 9.1   Albumin (g/dL)  Date Value  01/06/2018 2.3 (L)   Corrected calcium = 9.96  Assessment/Plan: Electrolytes: Will replace for goal potassium ~4, goal magnesium ~ 2, and goal phosphorus > 2.5.   No replacement needed.  Will order electrolytes with AM labs on moday.  Glucose:  Patient is not diabetic. BG for last 24 hours 122-155. New initiation of Methylprednisolone 60 mg IV q6h. Watch blood sugars-if rise above 180 will consider SSI.  Constipation: Last recorded BM 11/8. Patient receiving Miralax 17g Daily.   Pharmacy will continue to monitor and adjust per consult.     Ramond Dial, PharmD Pharmacy Resident  01/08/2018 9:44 AM

## 2018-01-08 NOTE — Progress Notes (Signed)
[facility] NAME: Stephanie [name]    MR#:  [id number]  DATE OF BIRTH:  [birth year]  SUBJECTIVE:   Now extubated on high flow nasal cannula oxygen. Trying to eat. Complains of shortness of breath. REVIEW OF SYSTEMS:   Review of Systems  Constitutional: Negative for chills, fever and weight loss.  HENT: Negative for ear discharge, ear pain and nosebleeds.   Eyes: Negative for blurred vision, pain and discharge.  Respiratory: Negative for sputum production, shortness of breath, wheezing and stridor.   Cardiovascular: Negative for chest pain, palpitations, orthopnea and PND.  Gastrointestinal: Negative for abdominal pain, diarrhea, nausea and vomiting.  Genitourinary: Negative for frequency and urgency.  Musculoskeletal: Negative for back pain and joint pain.  Neurological: Negative for sensory change, speech change, focal weakness and weakness.  Psychiatric/Behavioral: Negative for depression and hallucinations. The patient is not nervous/anxious.    Tolerating Diet:regular Tolerating PT:on HFNC  DRUG ALLERGIES:  No Known Allergies  VITALS:  Blood pressure (!) 160/75, pulse 95, temperature 98.9 F (37.2 C), temperature source Axillary, resp. rate (!) 24, height 5\' 4"  (1.626 m), weight (!) 233 kg, SpO2 95 %.  PHYSICAL EXAMINATION:   Physical Exam  GENERAL:  57 y.o.-year-old patient lying in the bed with no acute distress. Morbidly obese EYES: Pupils equal, round, reactive to light and accommodation. No scleral icterus. Extraocular muscles intact.  HEENT: Head atraumatic, normocephalic. Oropharynx and nasopharynx clear.HFNC BIPAP+NECK:  Supple, no jugular venous distention. No thyroid enlargement, no tenderness.  LUNGS: Normal breath sounds bilaterally, no wheezing, rales, rhonchi. No use of accessory muscles of respiration.  CARDIOVASCULAR: S1, S2 normal. No murmurs, rubs, or gallops.  ABDOMEN: Soft, nontender,  nondistended. Bowel sounds present. No organomegaly or mass.  EXTREMITIES: bilateral lower extremity lymphedema NEUROLOGIC: overall weak but no gross focal deficit PSYCHIATRIC: awakens on VC SKIN: as above  LABORATORY PANEL:  CBC Recent Labs  Lab 01/07/18 0413  WBC 8.7  HGB 11.1*  HCT 37.6  PLT 230    Chemistries  Recent Labs  Lab 01/06/18 0501  01/08/18 0329  NA 149*   < > 145  K 4.0   < > 4.4  CL 113*   < > 109  CO2 31   < > 31  GLUCOSE 115*   < > 155*  BUN 28*   < > 28*  CREATININE 0.81   < > 0.79  CALCIUM 8.6*   < > 9.1  MG 2.1  --  2.2  AST 124*  --   --   ALT 135*  --   --   ALKPHOS 38  --   --   BILITOT 1.0  --   --    < > = values in this interval not displayed.   Cardiac Enzymes Recent Labs  Lab 01/01/18 1649  TROPONINI 0.24*   RADIOLOGY:  No results found. ASSESSMENT AND PLAN:  Stephanie [name]  is a 57 y.o. female with a known history of hypertension, morbid obesity presented to the emergency room due to extreme fatigue, falls.  Patient was found to have multiple areas of skin breakdown.  Tachycardia, hypoxic respiratory failure. She has had trouble with a chronic wound on her right leg which has gotten weaker over time.    * Sepsis secondary to right lower extremity cellulitis. likely has chronic edema secondary to massive obesity/chronic lymphedema. -On broad-spectrum antibiotics with cefepime- -Blood culture so far negative. -MRSA PCR positive  *Acute hypoxic  respiratory failure in the setting of sepsis with obesity hypoventilation syndrome -patient was falling asleep and not able to protect her areas. She got intubated and placed on the ventilator--now extubated -BIPAP As needed -appreciate ICU attending input -high dose IV steroids--on HFNC  *Acute kidney injury secondary to sepsis and Rhabdomyolysis -IV fluids.   -creat better Elevated sodium  150--149--146  *Hypertension.  Blood pressure in the normal range.  Hold  medications.  *Elevated troponin due to rhabdomyolysis and demand ischemia No chest pain EKG shows no ST changes  *Acute rhabdomyolysis due to recurrent falls.  - IV fluids.   -CPK trending down   Case discussed with Care Management/Social Worker. Management plans discussed with the patient, family and they are in agreement.  CODE STATUS: full  DVT Prophylaxis: Lovenox  TOTAL TIME TAKING CARE OF THIS PATIENT: *30* minutes.  >50% time spent on counselling and coordination of care  POSSIBLE D/C IN ?? DAYS, DEPENDING ON CLINICAL CONDITION.  Note: This dictation was prepared with Dragon dictation along with smaller phrase technology. Any transcriptional errors that result from this process are unintentional.  Fritzi Mandes M.D on 01/08/2018 at 3:02 PM  Between 7am to 6pm - Pager - [phone]  After 6pm go to [email or web address] - password [email or web address]  [facility]  Office  [phone]  CC: Primary care physician; Patient, No Pcp PerPatient ID: Stephanie [name], female   DOB: 14-Mar-1960, 57 y.o.   MRN: [id number]

## 2018-01-08 NOTE — Progress Notes (Signed)
Follow up - Critical Care Medicine Note  Patient Details:    Stephanie [name] is an 57 y.o. female. with history of morbid obesity hypertension.  Patient presented to the ER with lethargy generalized weakness and frequent falls recently.  She was found to have cellulitis of the right lower extremities with acute kidney injury and rhabdomyolysis.  Patient had deterioration of respiratory status requiring intubation  Lines, Airways, Drains: Airway 8 mm (Active)  Secured at (cm) 25 cm 01/03/2018 11:30 AM  Measured From Lips 01/03/2018 11:30 AM  Secured Location Right 01/03/2018  8:00 AM  Secured By [employer] 01/03/2018 11:30 AM  Tube Holder Repositioned Yes 01/03/2018  2:16 AM  Cuff Pressure (cm H2O) 28 cm H2O 01/03/2018  8:00 AM  Site Condition Dry 01/03/2018 11:30 AM     CVC Triple Lumen 01/02/18 Right Internal jugular 25 cm 3 cm (Active)  Indication for Insertion or Continuance of Line Limited venous access - need for IV therapy >5 days (PICC only) 01/03/2018  8:00 AM  Site Assessment Clean;Dry;Intact 01/03/2018  8:00 AM  Proximal Lumen Status Infusing 01/03/2018  8:00 AM  Medial Lumen Status Saline locked 01/03/2018  8:00 AM  Distal Lumen Status Infusing 01/03/2018  8:00 AM  Dressing Type Transparent;Occlusive 01/03/2018  8:00 AM  Dressing Status Clean;Dry;Intact;Antimicrobial disc in place 01/03/2018  8:00 AM  Line Care Connections checked and tightened 01/03/2018  8:00 AM  Dressing Intervention New dressing;Dressing changed;Antimicrobial disc changed 01/02/2018  7:00 PM  Dressing Change Due 01/09/18 01/03/2018  8:00 AM     NG/OG Tube Orogastric Center mouth Xray Documented cm marking at nare/ corner of mouth 65 cm (Active)  Cm Marking at Nare/Corner of Mouth (if applicable) 65 cm 85/05/6268  8:00 AM  Site Assessment Clean;Dry;Intact 01/03/2018  8:00 AM  Ongoing Placement Verification No change in respiratory status;No change in cm markings or external length of tube from initial  placement;Xray 01/03/2018  8:00 AM  Status Infusing tube feed 01/03/2018  8:00 AM  Amount of suction 100 mmHg 01/03/2018  8:00 AM  Drainage Appearance Brown 01/03/2018  8:00 AM  Intake (mL) 100 mL 01/03/2018 10:00 AM     Urethral Catheter Jobe Marker, RN Double-lumen 16 Fr. (Active)  Indication for Insertion or Continuance of Catheter Unstable critical patients (first 24-48 hours) 01/03/2018  8:00 AM  Site Assessment Intact 01/03/2018  8:00 AM  Catheter Maintenance Bag below level of bladder;Catheter secured;Drainage bag/tubing not touching floor;No dependent loops;Seal intact 01/03/2018  8:00 AM  Collection Container Standard drainage bag 01/03/2018  8:00 AM  Securement Method Securing device (Describe) 01/03/2018  8:00 AM  Urinary Catheter Interventions Unclamped 01/03/2018  8:00 AM  Output (mL) 100 mL 01/03/2018  8:00 AM    Anti-infectives:  Anti-infectives (From admission, onward)   Start     Dose/Rate Route Frequency Ordered Stop   01/06/18 0000  vancomycin (VANCOCIN) 1,250 mg in sodium chloride 0.9 % 250 mL IVPB     1,250 mg 166.7 mL/hr over 90 Minutes Intravenous Every 12 hours 01/05/18 1210 01/07/18 1424   01/04/18 0000  vancomycin (VANCOCIN) 1,500 mg in sodium chloride 0.9 % 500 mL IVPB  Status:  Discontinued     1,500 mg 250 mL/hr over 120 Minutes Intravenous Every 12 hours 01/03/18 2021 01/05/18 1210   01/02/18 2000  vancomycin (VANCOCIN) 1,250 mg in sodium chloride 0.9 % 250 mL IVPB  Status:  Discontinued     1,250 mg 166.7 mL/hr over 90 Minutes Intravenous Every 8 hours 01/02/18 1204  01/03/18 2021   01/02/18 0000  vancomycin (VANCOCIN) 1,750 mg in sodium chloride 0.9 % 500 mL IVPB  Status:  Discontinued     1,750 mg 250 mL/hr over 120 Minutes Intravenous Every 12 hours 01/01/18 2118 01/02/18 1204   01/01/18 2130  vancomycin (VANCOCIN) IVPB 750 mg/150 ml premix     750 mg 150 mL/hr over 60 Minutes Intravenous  Once 01/01/18 2116 01/01/18 2339   01/01/18 1830  ceFEPIme (MAXIPIME) 2 g  in sodium chloride 0.9 % 100 mL IVPB     2 g 200 mL/hr over 30 Minutes Intravenous Every 12 hours 01/01/18 1826 01/07/18 2106   01/01/18 1730  vancomycin (VANCOCIN) IVPB 1000 mg/200 mL premix     1,000 mg 200 mL/hr over 60 Minutes Intravenous  Once 01/01/18 1726 01/01/18 1917   01/01/18 1730  cefTRIAXone (ROCEPHIN) 2 g in sodium chloride 0.9 % 100 mL IVPB  Status:  Discontinued     2 g 200 mL/hr over 30 Minutes Intravenous Every 24 hours 01/01/18 1726 01/01/18 1814      Microbiology: Results for orders placed or performed during the hospital encounter of 01/01/18  Blood Culture (routine x 2)     Status: None   Collection Time: 01/01/18  5:09 PM  Result Value Ref Range Status   Specimen Description BLOOD LT [facility]  Final   Special Requests   Final    BOTTLES DRAWN AEROBIC AND ANAEROBIC Blood Culture results may not be optimal due to an excessive volume of blood received in culture bottles   Culture   Final    NO GROWTH 5 DAYS Performed at [facility], [address] [place], [place] [address]    Report Status 01/06/2018 FINAL  Final  Blood Culture (routine x 2)     Status: None   Collection Time: 01/01/18  5:09 PM  Result Value Ref Range Status   Specimen Description BLOOD LT HAND  Final   Special Requests   Final    BOTTLES DRAWN AEROBIC AND ANAEROBIC Blood Culture adequate volume   Culture   Final    NO GROWTH 5 DAYS Performed at [facility], [address] [place], [place] [address]    Report Status 01/06/2018 FINAL  Final  MRSA PCR Screening     Status: Abnormal   Collection Time: 01/01/18  9:58 PM  Result Value Ref Range Status   MRSA by PCR POSITIVE (A) NEGATIVE Final    Comment:        The GeneXpert MRSA Assay (FDA approved for NASAL specimens only), is one component of a comprehensive MRSA colonization surveillance program. It is not intended to diagnose MRSA infection nor to guide or monitor treatment for MRSA infections. RESULT CALLED TO,  READ BACK BY AND VERIFIED WITH: FELICIA PREDUHOMME ON 56/4/33 AT [address] [facility] Performed at [facility], [address] [place], [place] [address]   Studies: Dg Abd 1 View  Result Date: 01/02/2018 CLINICAL DATA:  NG tube placement EXAM: ABDOMEN - 1 VIEW COMPARISON:  None. FINDINGS: Limited exam secondary to body habitus. NG tube appears within stomach IMPRESSION: NG tube within stomach. Electronically Signed   By: Suzy Bouchard M.D.   On: 01/02/2018 12:40   Dg Chest Port 1 View  Result Date: 01/06/2018 CLINICAL DATA:  On ventilation. EXAM: PORTABLE CHEST 1 VIEW COMPARISON:  Radiographs of January 05, 2018. FINDINGS: Endotracheal and nasogastric tubes are unchanged in position. Stable cardiomegaly with central pulmonary vascular congestion. Possibly increased bibasilar  opacities are noted suggesting worsening edema or atelectasis or possible effusions. No pneumothorax is noted. Bony thorax is unremarkable. IMPRESSION: Stable support apparatus. Stable cardiomegaly with central pulmonary vascular congestion. Possibly increased bibasilar opacities are noted suggesting worsening edema or atelectasis or possible effusions. Electronically Signed   By: Marijo Conception, M.D.   On: 01/06/2018 10:27   Dg Chest Port 1 View  Result Date: 01/05/2018 CLINICAL DATA:  Acute respiratory failure. EXAM: PORTABLE CHEST 1 VIEW COMPARISON:  Radiograph of January 04, 2018. FINDINGS: Stable cardiomegaly. Endotracheal and nasogastric tubes are unchanged in position. Mild central pulmonary vascular congestion is noted. No pneumothorax or definite pleural effusion is noted. No consolidative process is noted. Bony thorax is unremarkable. IMPRESSION: Stable cardiomegaly with central pulmonary vascular congestion. Stable support apparatus. Electronically Signed   By: Marijo Conception, M.D.   On: 01/05/2018 07:17   Dg Chest Port 1 View  Result Date: 01/04/2018 CLINICAL DATA:  Intubated patient with acute kidney injury,  rhabdomyolysis and hypertension. EXAM: PORTABLE CHEST 1 VIEW COMPARISON:  Single-view of the chest 01/03/2018 and 01/02/2018. FINDINGS: The patient's right IJ catheter appears to have backed out with the tip now in the distal right internal jugular vein near its confluence with the right subclavian vein. However, visualization could be limited. Endotracheal tube and NG tube are again seen. There is cardiomegaly and vascular congestion. Mild bibasilar atelectasis is noted. IMPRESSION: Right IJ catheter appears to have backed out with its tip now projecting in the distal right internal jugular vein. Visualization may be limited on this portable examination given the patient's habitus. Cardiomegaly and pulmonary vascular congestion. Mild bibasilar airspace disease, likely atelectasis. Electronically Signed   By: Inge Rise M.D.   On: 01/04/2018 11:20   Dg Chest Port 1 View  Result Date: 01/03/2018 CLINICAL DATA:  Sepsis, atelectasis. EXAM: PORTABLE CHEST 1 VIEW COMPARISON:  Portable chest x-ray of January 02, 2018. FINDINGS: The lungs are adequately inflated. The interstitial markings are increased. The pulmonary vascularity is engorged. The cardiac silhouette is enlarged. There is calcification in the wall of the aortic arch. The endotracheal tube tip projects approximately 3.3 cm above the carina. The esophagogastric tube tip is difficult to discern but appears to project below the GE junction. The right internal jugular venous catheter tip projects over the proximal SVC. The observed bony thorax is unremarkable. IMPRESSION: Patchy airspace opacities as well as interstitial prominence bilaterally likely reflects CHF and interstitial edema. One cannot absolutely exclude pneumonia. The endotracheal tube tip is in reasonable position. The esophagogastric tube tip cannot be clearly discerned. Thoracic aortic atherosclerosis. Electronically Signed   By: David  Martinique M.D.   On: 01/03/2018 07:42   Dg Chest Port  1 View  Result Date: 01/02/2018 CLINICAL DATA:  Check central line placement EXAM: PORTABLE CHEST 1 VIEW COMPARISON:  01/02/2018 FINDINGS: Right jugular central line is better appreciated on this exam extending into at least the mid superior vena cava. The more distal aspect is not as well visualized again due to patient's body habitus. Cardiac shadow remains enlarged. Endotracheal tube and nasogastric catheter are again noted in satisfactory position. Vascular congestion is again noted. No evidence of pneumothorax. IMPRESSION: Right jugular central line extending into the SVC. No pneumothorax is noted. Electronically Signed   By: Inez Catalina M.D.   On: 01/02/2018 19:21   Dg Chest Port 1 View  Result Date: 01/02/2018 CLINICAL DATA:  Check central line placement EXAM: PORTABLE CHEST 1 VIEW COMPARISON:  01/02/2018 FINDINGS: The  examination is significantly limited by patient's body habitus. Previously seen central line is faintly visualized although the tip is not discretely seen. Endotracheal tube and nasogastric catheter are again noted. The lungs are again well aerated with mild vascular congestion. No pneumothorax is noted. IMPRESSION: Stable appearance of the chest. The right jugular central line is not well appreciated on this study in part due to patient body habitus as well as motion. Changes of vascular congestion are again seen Electronically Signed   By: Inez Catalina M.D.   On: 01/02/2018 14:54   Dg Chest Port 1 View  Result Date: 01/02/2018 CLINICAL DATA:  Central line placement. EXAM: PORTABLE CHEST 1 VIEW COMPARISON:  Chest x-ray from same day at 11:55 a.m. FINDINGS: Endotracheal tube tip approximately 1.8 cm above the carina. Unchanged enteric tube. Interval placement of a right internal jugular central venous catheter with the tip near the confluence of the internal jugular and brachiocephalic veins. Stable cardiomegaly and mild interstitial edema. Unchanged retrocardiac opacification and  probable left pleural effusion. The right lung is clear. No pneumothorax. No acute osseous abnormality. IMPRESSION: 1. Right internal jugular central venous catheter with tip near the confluence of the internal jugular and brachiocephalic veins. Consider advancing or exchanging for a longer catheter. 2. No pneumothorax. 3. Stable congestive heart failure and retrocardiac airspace disease, likely atelectasis. Electronically Signed   By: Titus Dubin M.D.   On: 01/02/2018 13:27   Dg Chest Port 1 View  Result Date: 01/02/2018 CLINICAL DATA:  Endotracheal tube and OG tube placement. EXAM: PORTABLE CHEST 1 VIEW COMPARISON:  One-view chest x-ray 01/01/2018 FINDINGS: Heart is enlarged. Mild edema has slightly increased. Retrocardiac opacification is present. No significant right-sided effusion is present. The patient has been intubated. The endotracheal tube terminates 2.5 cm above the carina. OG tube courses off the inferior border of the film. IMPRESSION: 1. Intubation. The endotracheal tube terminates 2.5 cm above the carina. 2. Cardiomegaly with increasing interstitial edema and congestive heart failure. 3. Retrocardiac airspace disease likely reflects atelectasis. Electronically Signed   By: San Morelle M.D.   On: 01/02/2018 12:38   Dg Chest Port 1 View  Result Date: 01/01/2018 CLINICAL DATA:  Fall.  Morbid obesity. EXAM: PORTABLE CHEST 1 VIEW COMPARISON:  None. FINDINGS: The study is limited by the low volume portable technique in positioning. There appears to be significant enlarged of the cardiac silhouette which may represent cardiomegaly. No pneumothorax. No nodules or masses. No obvious infiltrates. The left retrocardiac region is not well assessed. IMPRESSION: 1. Enlargement of the cardiac silhouette. The study is limited but no focal infiltrate noted. The left retrocardiac region in particular is not well evaluated. Electronically Signed   By: Dorise Bullion III M.D   On: 01/01/2018 19:07    Dg Tibia/fibula Right Port  Result Date: 01/01/2018 CLINICAL DATA:  Fall. EXAM: PORTABLE RIGHT TIBIA AND FIBULA - 2 VIEW COMPARISON:  None. FINDINGS: The study is limited due to poor penetration due to the patient's body habitus. No definitive fractures noted. IMPRESSION: Limited study due to body habitus.  No obvious fractures. Electronically Signed   By: Dorise Bullion III M.D   On: 01/01/2018 19:08   [place] Ekg Site Rite  Result Date: 01/05/2018 If Site Rite image not attached, placement could not be confirmed due to current cardiac rhythm.  [place] Ekg Site Rite  Result Date: 01/02/2018 If Site Rite image not attached, placement could not be confirmed due to current cardiac rhythm.   Subjective:  Overnight Issues: No significant issues noted overnight.  Patient was extubated yesterday to BiPAP.  This morning is awake alert.  Objective:  Vital signs for last 24 hours: Temp:  [98.1 F (36.7 C)-99.2 F (37.3 C)] 98.1 F (36.7 C) (11/09 0759) Pulse Rate:  [85-110] 96 (11/09 0737) Resp:  [16-28] 20 (11/09 0737) BP: (135-180)/(67-92) 155/74 (11/09 0700) SpO2:  [94 %-100 %] 95 % (11/09 0737) FiO2 (%):  [50 %] 50 % (11/09 0737) Weight:  [233 kg] 233 kg (11/09 0605)  Hemodynamic parameters for last 24 hours:    Intake/Output from previous day: 11/08 0701 - 11/09 0700 In: 2055.7 [I.V.:1513.5; IV Piggyback:542.2] Out: 2500 [Urine:2500]  Intake/Output this shift: No intake/output data recorded.  Vent settings for last 24 hours: FiO2 (%):  [50 %] 50 %  Physical Exam:  Vital signs: Please see the above listed vital signs HEENT: Patient is on BiPAP, no accessory muscle utilization Cardiovascular: Tachycardia regular rate and rhythm Pulmonary: Diminished breath sounds Abdominal: Massive obesity Extremities: Massive lower extremity edema with superimposed cellulitis and chronic venous stasis changes Neurologic: Patient moves all extremities and is responsive  Assessment/Plan:   Acute hypercapnic respiratory failure, with 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  Was somewhat somnolent yesterday however much more alert this morning.  Will begin to wean BiPAP as tolerated   Lower extremity cellulitis.  Likely improved.  Patient is on cefepime and vancomycin, most likely chronic lymphedema from massive obesity.  Complete a 7-day course  Transaminitis.  Improving  Conjunctivitis. On Tobrex    Stephanie [name] 01/08/2018  *Care during the described time interval was provided by me and/or other providers on the critical care team.  I have reviewed this patient's available data, including medical history, events of note, physical examination and test results as part of my evaluation. Patient ID: Panzy Bubeck, female   DOB: 03/25/1960, 57 y.o.   MRN: [id number] Patient ID: Avri Paiva, female   DOB: 1960-11-06, 57 y.o.   MRN: [id number] Patient ID: Violetta Lavalle, female   DOB: 01-22-1961, 57 y.o.   MRN: [id number] Patient ID: Jasmynn Pfalzgraf, female   DOB: 04-Mar-1960, 57 y.o.   MRN: [id number] Patient ID: Zariya Minner, female   DOB: 12-May-1960, 57 y.o.   MRN: [id number]

## 2018-01-09 LAB — BLOOD GAS, ARTERIAL
ACID-BASE EXCESS: 4.9 mmol/L — AB (ref 0.0–2.0)
Bicarbonate: 32.4 mmol/L — ABNORMAL HIGH (ref 20.0–28.0)
FIO2: 0.28
O2 SAT: 86.2 %
PATIENT TEMPERATURE: 37
pCO2 arterial: 60 mmHg — ABNORMAL HIGH (ref 32.0–48.0)
pH, Arterial: 7.34 — ABNORMAL LOW (ref 7.350–7.450)
pO2, Arterial: 55 mmHg — ABNORMAL LOW (ref 83.0–108.0)

## 2018-01-09 LAB — GLUCOSE, CAPILLARY
GLUCOSE-CAPILLARY: 132 mg/dL — AB (ref 70–99)
GLUCOSE-CAPILLARY: 134 mg/dL — AB (ref 70–99)
GLUCOSE-CAPILLARY: 137 mg/dL — AB (ref 70–99)
GLUCOSE-CAPILLARY: 143 mg/dL — AB (ref 70–99)
Glucose-Capillary: 138 mg/dL — ABNORMAL HIGH (ref 70–99)
Glucose-Capillary: 131 mg/dL — ABNORMAL HIGH (ref 70–99)

## 2018-01-09 MED ORDER — INFLUENZA VAC SPLIT QUAD 0.5 ML IM SUSY
0.5000 mL | PREFILLED_SYRINGE | INTRAMUSCULAR | Status: AC
  Administered 2018-01-12: 0.5 mL via INTRAMUSCULAR
  Filled 2018-01-09: qty 0.5

## 2018-01-09 MED ORDER — ADULT MULTIVITAMIN LIQUID CH
15.0000 mL | Freq: Every day | ORAL | Status: DC
  Filled 2018-01-09: qty 15

## 2018-01-09 MED ORDER — DEXMEDETOMIDINE HCL IN NACL 400 MCG/100ML IV SOLN
0.0000 ug/kg/h | INTRAVENOUS | Status: DC
  Administered 2018-01-09: 0.3 ug/kg/h via INTRAVENOUS
  Administered 2018-01-10: 0.5 ug/kg/h via INTRAVENOUS
  Administered 2018-01-10: 0.4 ug/kg/h via INTRAVENOUS
  Filled 2018-01-09 (×3): qty 100

## 2018-01-09 MED ORDER — METHYLPREDNISOLONE SODIUM SUCC 40 MG IJ SOLR
40.0000 mg | Freq: Two times a day (BID) | INTRAMUSCULAR | Status: DC
  Administered 2018-01-09 – 2018-01-10 (×2): 40 mg via INTRAVENOUS
  Filled 2018-01-09 (×2): qty 1

## 2018-01-09 NOTE — Progress Notes (Signed)
[facility] [place] NAME: Stephanie [name]    MR#:  [id number]  DATE OF BIRTH:  June 26, 1960  SUBJECTIVE:   Now extubated on nasal cannula oxygen. Trying to eat. Complains of shortness of breath. REVIEW OF SYSTEMS:   Review of Systems  Constitutional: Negative for chills, fever and weight loss.  HENT: Negative for ear discharge, ear pain and nosebleeds.   Eyes: Negative for blurred vision, pain and discharge.  Respiratory: Negative for sputum production, shortness of breath, wheezing and stridor.   Cardiovascular: Negative for chest pain, palpitations, orthopnea and PND.  Gastrointestinal: Negative for abdominal pain, diarrhea, nausea and vomiting.  Genitourinary: Negative for frequency and urgency.  Musculoskeletal: Negative for back pain and joint pain.  Neurological: Negative for sensory change, speech change, focal weakness and weakness.  Psychiatric/Behavioral: Negative for depression and hallucinations. The patient is not nervous/anxious.    Tolerating Diet:regular Tolerating PT:on HFNC  DRUG ALLERGIES:  No Known Allergies  VITALS:  Blood pressure (!) 157/78, pulse 90, temperature 98.7 F (37.1 C), temperature source Oral, resp. rate 19, height 5\' 4"  (1.626 m), weight (!) 214 kg, SpO2 99 %.  PHYSICAL EXAMINATION:   Physical Exam  GENERAL:  57 y.o.-year-old patient lying in the bed with no acute distress. Morbidly obese EYES: Pupils equal, round, reactive to light and accommodation. No scleral icterus. Extraocular muscles intact.  HEENT: Head atraumatic, normocephalic. Oropharynx and nasopharynx clear.HFNC BIPAP+NECK:  Supple, no jugular venous distention. No thyroid enlargement, no tenderness.  LUNGS: Normal breath sounds bilaterally, no wheezing, rales, rhonchi. No use of accessory muscles of respiration.  CARDIOVASCULAR: S1, S2 normal. No murmurs, rubs, or gallops.  ABDOMEN: Soft, nontender, nondistended. Bowel sounds  present. No organomegaly or mass.  EXTREMITIES: bilateral lower extremity lymphedema NEUROLOGIC: overall weak but no gross focal deficit PSYCHIATRIC: awakens on VC SKIN: as above  LABORATORY PANEL:  CBC Recent Labs  Lab 01/07/18 0413  WBC 8.7  HGB 11.1*  HCT 37.6  PLT 230    Chemistries  Recent Labs  Lab 01/06/18 0501  01/08/18 0329  NA 149*   < > 145  K 4.0   < > 4.4  CL 113*   < > 109  CO2 31   < > 31  GLUCOSE 115*   < > 155*  BUN 28*   < > 28*  CREATININE 0.81   < > 0.79  CALCIUM 8.6*   < > 9.1  MG 2.1  --  2.2  AST 124*  --   --   ALT 135*  --   --   ALKPHOS 38  --   --   BILITOT 1.0  --   --    < > = values in this interval not displayed.   Cardiac Enzymes No results for input(s): TROPONINI in the last 168 hours. RADIOLOGY:  No results found. ASSESSMENT AND PLAN:  Cicely Ortner  is a 57 y.o. female with a known history of hypertension, morbid obesity presented to the emergency room due to extreme fatigue, falls.  Patient was found to have multiple areas of skin breakdown.  Tachycardia, hypoxic respiratory failure. She has had trouble with a chronic wound on her right leg which has gotten weaker over time.    * Sepsis secondary to right lower extremity cellulitis. likely has chronic edema secondary to massive obesity/chronic lymphedema. -On broad-spectrum antibiotics with cefepime- -Blood culture so far negative. -MRSA PCR positive  *Acute hypoxic respiratory failure in the  setting of sepsis with obesity hypoventilation syndrome -patient was falling asleep and not able to protect her areas. She got intubated and placed on the ventilator--now extubated -BIPAP As needed -appreciate ICU attending input -high dose IV steroids--on HFNC--to [facility]ate -discussed with ICU attending. Patient is a chronic CO2 retainer. She will benefit from noninvasive ventilation at home. Will ask care management for help  *Acute kidney injury secondary to sepsis and Rhabdomyolysis -IV  fluids.   -creat better Elevated sodium  150--149--146  *Hypertension.  Blood pressure in the normal range.  Hold medications.  *Elevated troponin due to rhabdomyolysis and demand ischemia No chest pain EKG shows no ST changes  *Acute rhabdomyolysis due to recurrent falls.  - IV fluids.   -CPK trending down   Case discussed with Care Management/Social Worker. Management plans discussed with the patient, family and they are in agreement.  CODE STATUS: full  DVT Prophylaxis: Lovenox  TOTAL TIME TAKING CARE OF THIS PATIENT: *30* minutes.  >50% time spent on counselling and coordination of care  POSSIBLE D/C IN ?? DAYS, DEPENDING ON CLINICAL CONDITION.  Note: This dictation was prepared with Dragon dictation along with smaller phrase technology. Any transcriptional errors that result from this process are unintentional.  Fritzi Mandes M.D on 01/09/2018 at 1:53 PM  Between 7am to 6pm - Pager - [phone]  After 6pm go to [email or web address] - password [email or web address]  [facility]  Office  [phone]  CC: Primary care physician; Patient, No Pcp PerPatient ID: Zyona Pettaway, female   DOB: [birth year], 57 y.o.   MRN: [id number]

## 2018-01-09 NOTE — Progress Notes (Addendum)
Follow up - Critical Care Medicine Note  Patient Details:    Stephanie [name] is an 57 y.o. female. with history of morbid obesity hypertension.  Patient presented to the ER with lethargy generalized weakness and frequent falls recently.  She was found to have cellulitis of the right lower extremities with acute kidney injury and rhabdomyolysis.  Patient had deterioration of respiratory status requiring intubation  Lines, Airways, Drains: Airway 8 mm (Active)  Secured at (cm) 25 cm 01/03/2018 11:30 AM  Measured From Lips 01/03/2018 11:30 AM  Secured Location Right 01/03/2018  8:00 AM  Secured By [employer] 01/03/2018 11:30 AM  Tube Holder Repositioned Yes 01/03/2018  2:16 AM  Cuff Pressure (cm H2O) 28 cm H2O 01/03/2018  8:00 AM  Site Condition Dry 01/03/2018 11:30 AM     CVC Triple Lumen 01/02/18 Right Internal jugular 25 cm 3 cm (Active)  Indication for Insertion or Continuance of Line Limited venous access - need for IV therapy >5 days (PICC only) 01/03/2018  8:00 AM  Site Assessment Clean;Dry;Intact 01/03/2018  8:00 AM  Proximal Lumen Status Infusing 01/03/2018  8:00 AM  Medial Lumen Status Saline locked 01/03/2018  8:00 AM  Distal Lumen Status Infusing 01/03/2018  8:00 AM  Dressing Type Transparent;Occlusive 01/03/2018  8:00 AM  Dressing Status Clean;Dry;Intact;Antimicrobial disc in place 01/03/2018  8:00 AM  Line Care Connections checked and tightened 01/03/2018  8:00 AM  Dressing Intervention New dressing;Dressing changed;Antimicrobial disc changed 01/02/2018  7:00 PM  Dressing Change Due 01/09/18 01/03/2018  8:00 AM     NG/OG Tube Orogastric Center mouth Xray Documented cm marking at nare/ corner of mouth 65 cm (Active)  Cm Marking at Nare/Corner of Mouth (if applicable) 65 cm 25/05/2704  8:00 AM  Site Assessment Clean;Dry;Intact 01/03/2018  8:00 AM  Ongoing Placement Verification No change in respiratory status;No change in cm markings or external length of tube from initial  placement;Xray 01/03/2018  8:00 AM  Status Infusing tube feed 01/03/2018  8:00 AM  Amount of suction 100 mmHg 01/03/2018  8:00 AM  Drainage Appearance Brown 01/03/2018  8:00 AM  Intake (mL) 100 mL 01/03/2018 10:00 AM     Urethral Catheter Jobe Marker, RN Double-lumen 16 Fr. (Active)  Indication for Insertion or Continuance of Catheter Unstable critical patients (first 24-48 hours) 01/03/2018  8:00 AM  Site Assessment Intact 01/03/2018  8:00 AM  Catheter Maintenance Bag below level of bladder;Catheter secured;Drainage bag/tubing not touching floor;No dependent loops;Seal intact 01/03/2018  8:00 AM  Collection Container Standard drainage bag 01/03/2018  8:00 AM  Securement Method Securing device (Describe) 01/03/2018  8:00 AM  Urinary Catheter Interventions Unclamped 01/03/2018  8:00 AM  Output (mL) 100 mL 01/03/2018  8:00 AM    Anti-infectives:  Anti-infectives (From admission, onward)   Start     Dose/Rate Route Frequency Ordered Stop   01/06/18 0000  vancomycin (VANCOCIN) 1,250 mg in sodium chloride 0.9 % 250 mL IVPB     1,250 mg 166.7 mL/hr over 90 Minutes Intravenous Every 12 hours 01/05/18 1210 01/07/18 1424   01/04/18 0000  vancomycin (VANCOCIN) 1,500 mg in sodium chloride 0.9 % 500 mL IVPB  Status:  Discontinued     1,500 mg 250 mL/hr over 120 Minutes Intravenous Every 12 hours 01/03/18 2021 01/05/18 1210   01/02/18 2000  vancomycin (VANCOCIN) 1,250 mg in sodium chloride 0.9 % 250 mL IVPB  Status:  Discontinued     1,250 mg 166.7 mL/hr over 90 Minutes Intravenous Every 8 hours 01/02/18 1204  01/03/18 2021   01/02/18 0000  vancomycin (VANCOCIN) 1,750 mg in sodium chloride 0.9 % 500 mL IVPB  Status:  Discontinued     1,750 mg 250 mL/hr over 120 Minutes Intravenous Every 12 hours 01/01/18 2118 01/02/18 1204   01/01/18 2130  vancomycin (VANCOCIN) IVPB 750 mg/150 ml premix     750 mg 150 mL/hr over 60 Minutes Intravenous  Once 01/01/18 2116 01/01/18 2339   01/01/18 1830  ceFEPIme (MAXIPIME) 2 g  in sodium chloride 0.9 % 100 mL IVPB     2 g 200 mL/hr over 30 Minutes Intravenous Every 12 hours 01/01/18 1826 01/07/18 2106   01/01/18 1730  vancomycin (VANCOCIN) IVPB 1000 mg/200 mL premix     1,000 mg 200 mL/hr over 60 Minutes Intravenous  Once 01/01/18 1726 01/01/18 1917   01/01/18 1730  cefTRIAXone (ROCEPHIN) 2 g in sodium chloride 0.9 % 100 mL IVPB  Status:  Discontinued     2 g 200 mL/hr over 30 Minutes Intravenous Every 24 hours 01/01/18 1726 01/01/18 1814      Microbiology: Results for orders placed or performed during the hospital encounter of 01/01/18  Blood Culture (routine x 2)     Status: None   Collection Time: 01/01/18  5:09 PM  Result Value Ref Range Status   Specimen Description BLOOD LT [facility]  Final   Special Requests   Final    BOTTLES DRAWN AEROBIC AND ANAEROBIC Blood Culture results may not be optimal due to an excessive volume of blood received in culture bottles   Culture   Final    NO GROWTH 5 DAYS Performed at [facility], [address], [place], [place] [address]    Report Status 01/06/2018 FINAL  Final  Blood Culture (routine x 2)     Status: None   Collection Time: 01/01/18  5:09 PM  Result Value Ref Range Status   Specimen Description BLOOD LT HAND  Final   Special Requests   Final    BOTTLES DRAWN AEROBIC AND ANAEROBIC Blood Culture adequate volume   Culture   Final    NO GROWTH 5 DAYS Performed at [facility], [address] [place], [place] [address]    Report Status 01/06/2018 FINAL  Final  MRSA PCR Screening     Status: Abnormal   Collection Time: 01/01/18  9:58 PM  Result Value Ref Range Status   MRSA by PCR POSITIVE (A) NEGATIVE Final    Comment:        The GeneXpert MRSA Assay (FDA approved for NASAL specimens only), is one component of a comprehensive MRSA colonization surveillance program. It is not intended to diagnose MRSA infection nor to guide or monitor treatment for MRSA infections. RESULT CALLED TO,  READ BACK BY AND VERIFIED WITH: FELICIA PREDUHOMME ON 41/9/37 AT 2356 [facility] Performed at [facility], [address] [place], [place] [address]   Studies: Dg Abd 1 View  Result Date: 01/02/2018 CLINICAL DATA:  NG tube placement EXAM: ABDOMEN - 1 VIEW COMPARISON:  None. FINDINGS: Limited exam secondary to body habitus. NG tube appears within stomach IMPRESSION: NG tube within stomach. Electronically Signed   By: Suzy Bouchard M.D.   On: 01/02/2018 12:40   Dg Chest Port 1 View  Result Date: 01/06/2018 CLINICAL DATA:  On ventilation. EXAM: PORTABLE CHEST 1 VIEW COMPARISON:  Radiographs of January 05, 2018. FINDINGS: Endotracheal and nasogastric tubes are unchanged in position. Stable cardiomegaly with central pulmonary vascular congestion. Possibly increased bibasilar  opacities are noted suggesting worsening edema or atelectasis or possible effusions. No pneumothorax is noted. Bony thorax is unremarkable. IMPRESSION: Stable support apparatus. Stable cardiomegaly with central pulmonary vascular congestion. Possibly increased bibasilar opacities are noted suggesting worsening edema or atelectasis or possible effusions. Electronically Signed   By: Marijo Conception, M.D.   On: 01/06/2018 10:27   Dg Chest Port 1 View  Result Date: 01/05/2018 CLINICAL DATA:  Acute respiratory failure. EXAM: PORTABLE CHEST 1 VIEW COMPARISON:  Radiograph of January 04, 2018. FINDINGS: Stable cardiomegaly. Endotracheal and nasogastric tubes are unchanged in position. Mild central pulmonary vascular congestion is noted. No pneumothorax or definite pleural effusion is noted. No consolidative process is noted. Bony thorax is unremarkable. IMPRESSION: Stable cardiomegaly with central pulmonary vascular congestion. Stable support apparatus. Electronically Signed   By: Marijo Conception, M.D.   On: 01/05/2018 07:17   Dg Chest Port 1 View  Result Date: 01/04/2018 CLINICAL DATA:  Intubated patient with acute kidney injury,  rhabdomyolysis and hypertension. EXAM: PORTABLE CHEST 1 VIEW COMPARISON:  Single-view of the chest 01/03/2018 and 01/02/2018. FINDINGS: The patient's right IJ catheter appears to have backed out with the tip now in the distal right internal jugular vein near its confluence with the right subclavian vein. However, visualization could be limited. Endotracheal tube and NG tube are again seen. There is cardiomegaly and vascular congestion. Mild bibasilar atelectasis is noted. IMPRESSION: Right IJ catheter appears to have backed out with its tip now projecting in the distal right internal jugular vein. Visualization may be limited on this portable examination given the patient's habitus. Cardiomegaly and pulmonary vascular congestion. Mild bibasilar airspace disease, likely atelectasis. Electronically Signed   By: Inge Rise M.D.   On: 01/04/2018 11:20   Dg Chest Port 1 View  Result Date: 01/03/2018 CLINICAL DATA:  Sepsis, atelectasis. EXAM: PORTABLE CHEST 1 VIEW COMPARISON:  Portable chest x-ray of January 02, 2018. FINDINGS: The lungs are adequately inflated. The interstitial markings are increased. The pulmonary vascularity is engorged. The cardiac silhouette is enlarged. There is calcification in the wall of the aortic arch. The endotracheal tube tip projects approximately 3.3 cm above the carina. The esophagogastric tube tip is difficult to discern but appears to project below the GE junction. The right internal jugular venous catheter tip projects over the proximal SVC. The observed bony thorax is unremarkable. IMPRESSION: Patchy airspace opacities as well as interstitial prominence bilaterally likely reflects CHF and interstitial edema. One cannot absolutely exclude pneumonia. The endotracheal tube tip is in reasonable position. The esophagogastric tube tip cannot be clearly discerned. Thoracic aortic atherosclerosis. Electronically Signed   By: David  Martinique M.D.   On: 01/03/2018 07:42   Dg Chest Port  1 View  Result Date: 01/02/2018 CLINICAL DATA:  Check central line placement EXAM: PORTABLE CHEST 1 VIEW COMPARISON:  01/02/2018 FINDINGS: Right jugular central line is better appreciated on this exam extending into at least the mid superior vena cava. The more distal aspect is not as well visualized again due to patient's body habitus. Cardiac shadow remains enlarged. Endotracheal tube and nasogastric catheter are again noted in satisfactory position. Vascular congestion is again noted. No evidence of pneumothorax. IMPRESSION: Right jugular central line extending into the SVC. No pneumothorax is noted. Electronically Signed   By: Inez Catalina M.D.   On: 01/02/2018 19:21   Dg Chest Port 1 View  Result Date: 01/02/2018 CLINICAL DATA:  Check central line placement EXAM: PORTABLE CHEST 1 VIEW COMPARISON:  01/02/2018 FINDINGS: The  examination is significantly limited by patient's body habitus. Previously seen central line is faintly visualized although the tip is not discretely seen. Endotracheal tube and nasogastric catheter are again noted. The lungs are again well aerated with mild vascular congestion. No pneumothorax is noted. IMPRESSION: Stable appearance of the chest. The right jugular central line is not well appreciated on this study in part due to patient body habitus as well as motion. Changes of vascular congestion are again seen Electronically Signed   By: Inez Catalina M.D.   On: 01/02/2018 14:54   Dg Chest Port 1 View  Result Date: 01/02/2018 CLINICAL DATA:  Central line placement. EXAM: PORTABLE CHEST 1 VIEW COMPARISON:  Chest x-ray from same day at 11:55 a.m. FINDINGS: Endotracheal tube tip approximately 1.8 cm above the carina. Unchanged enteric tube. Interval placement of a right internal jugular central venous catheter with the tip near the confluence of the internal jugular and brachiocephalic veins. Stable cardiomegaly and mild interstitial edema. Unchanged retrocardiac opacification and  probable left pleural effusion. The right lung is clear. No pneumothorax. No acute osseous abnormality. IMPRESSION: 1. Right internal jugular central venous catheter with tip near the confluence of the internal jugular and brachiocephalic veins. Consider advancing or exchanging for a longer catheter. 2. No pneumothorax. 3. Stable congestive heart failure and retrocardiac airspace disease, likely atelectasis. Electronically Signed   By: Titus Dubin M.D.   On: 01/02/2018 13:27   Dg Chest Port 1 View  Result Date: 01/02/2018 CLINICAL DATA:  Endotracheal tube and OG tube placement. EXAM: PORTABLE CHEST 1 VIEW COMPARISON:  One-view chest x-ray 01/01/2018 FINDINGS: Heart is enlarged. Mild edema has slightly increased. Retrocardiac opacification is present. No significant right-sided effusion is present. The patient has been intubated. The endotracheal tube terminates 2.5 cm above the carina. OG tube courses off the inferior border of the film. IMPRESSION: 1. Intubation. The endotracheal tube terminates 2.5 cm above the carina. 2. Cardiomegaly with increasing interstitial edema and congestive heart failure. 3. Retrocardiac airspace disease likely reflects atelectasis. Electronically Signed   By: San Morelle M.D.   On: 01/02/2018 12:38   Dg Chest Port 1 View  Result Date: 01/01/2018 CLINICAL DATA:  Fall.  Morbid obesity. EXAM: PORTABLE CHEST 1 VIEW COMPARISON:  None. FINDINGS: The study is limited by the low volume portable technique in positioning. There appears to be significant enlarged of the cardiac silhouette which may represent cardiomegaly. No pneumothorax. No nodules or masses. No obvious infiltrates. The left retrocardiac region is not well assessed. IMPRESSION: 1. Enlargement of the cardiac silhouette. The study is limited but no focal infiltrate noted. The left retrocardiac region in particular is not well evaluated. Electronically Signed   By: Dorise Bullion III M.D   On: 01/01/2018 19:07    Dg Tibia/fibula Right Port  Result Date: 01/01/2018 CLINICAL DATA:  Fall. EXAM: PORTABLE RIGHT TIBIA AND FIBULA - 2 VIEW COMPARISON:  None. FINDINGS: The study is limited due to poor penetration due to the patient's body habitus. No definitive fractures noted. IMPRESSION: Limited study due to body habitus.  No obvious fractures. Electronically Signed   By: Dorise Bullion III M.D   On: 01/01/2018 19:08   [place] Ekg Site Rite  Result Date: 01/05/2018 If Site Rite image not attached, placement could not be confirmed due to current cardiac rhythm.  [place] Ekg Site Rite  Result Date: 01/02/2018 If Site Rite image not attached, placement could not be confirmed due to current cardiac rhythm.   Subjective:  Overnight Issues: No significant issues noted.  Patient wore BiPAP last night and presently on flow oxygen  Objective:  Vital signs for last 24 hours: Temp:  [97.7 F (36.5 C)-98.9 F (37.2 C)] 98.7 F (37.1 C) (11/10 0815) Pulse Rate:  [86-97] 89 (11/10 0815) Resp:  [18-24] 19 (11/10 0815) BP: (127-181)/(69-88) 138/88 (11/10 0800) SpO2:  [93 %-100 %] 95 % (11/10 0851) FiO2 (%):  [40 %-50 %] 40 % (11/10 0851) Weight:  [500 kg] 214 kg (11/10 0500)  Hemodynamic parameters for last 24 hours:    Intake/Output from previous day: 11/09 0701 - 11/10 0700 In: 1144.3 [I.V.:1144.3] Out: 1400 [Urine:1400]  Intake/Output this shift: Total I/O In: 115.8 [I.V.:115.8] Out: 225 [Urine:225]  Vent settings for last 24 hours: FiO2 (%):  [40 %-50 %] 40 %  Physical Exam:  Vital signs: Please see the above listed vital signs HEENT: No oral lesions appreciated, trachea is midline, on heated high flow Cardiovascular: regular rate and rhythm Pulmonary: Diminished breath sounds Abdominal: Massive obesity Extremities: Massive lower extremity edema with superimposed cellulitis improved Neurologic: Patient moves all extremities and is responsive  Assessment/Plan:  Acute hypercapnic respiratory  failure, with 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  Is doing much better today.  Patient will need nocturnal BiPAP, will need to have an outpatient titration study but will need to go home with BiPAP with a diagnosis of obesity hypoventilation syndrome and hypercapnic respiratory failure.  Patient's bronchospasm is significantly improved, will start to wean Solu-Medrol  Lower extremity cellulitis.  Improved.  Completed a course of antibiotics  Hypertension.  Patient is presently on amlodipine with as needed Toprol and hydralazine  Transaminitis.  Improving  Conjunctivitis. On Tobrex  We will monitor in intensive care unit today, nocturnal BiPAP if stable in the morning will transfer to the floor.  Will need physical therapy  Agnes Brightbill 01/09/2018  *Care during the described time interval was provided by me and/or other providers on the critical care team.  I have reviewed this patient's available data, including medical history, events of note, physical examination and test results as part of my evaluation. Patient ID: Palak Tercero, female   DOB: November 08, 1960, 57 y.o.   MRN: [id number] Patient ID: Amberrose Friebel, female   DOB: 1960/08/28, 57 y.o.   MRN: [id number] Patient ID: Sherece Gambrill, female   DOB: Oct 05, 1960, 57 y.o.   MRN: [id number] Patient ID: Beretta Ginsberg, female   DOB: 03/12/1960, 57 y.o.   MRN: [id number] Patient ID: Neyla Gauntt, female   DOB: Nov 06, 1960, 57 y.o.   MRN: [id number] Patient ID: Anesa Fronek, female   DOB: 1961/02/13, 57 y.o.   MRN: [id number]

## 2018-01-09 NOTE — Progress Notes (Signed)
Patient ID: Stephanie [name], female   DOB: January 24, 1961, 57 y.o.   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  Patient will need nocturnal positive pressure ventilation upon discharge.  She will also need an outpatient polysomnogram for titration study.  Hermelinda Dellen, DO

## 2018-01-09 NOTE — Progress Notes (Signed)
Patient keeps taking of bipap and had to put it back on about six times within the past half hour. Had to put back on 2L [place] since patient won't wear and was very confused. Asked nurse if her mother was behind her and where did she go. Tried to explain that her mother was not in the room, and that patient is acting a little confused. Patient cleared up and said she don't know why she woke up confused. Called NP to ask for abg since patient acting so confused and it is different than change of shift. Order obtained from NP and RT notified.

## 2018-01-09 NOTE — Progress Notes (Signed)
MD Conforti aware patient's urine is bright red blood in foley catheter. No new orders at this time. Will continue to monitor.

## 2018-01-10 ENCOUNTER — Encounter: Payer: Self-pay | Admitting: General Practice

## 2018-01-10 DIAGNOSIS — E662 Morbid (severe) obesity with alveolar hypoventilation: Secondary | ICD-10-CM

## 2018-01-10 DIAGNOSIS — E8770 Fluid overload, unspecified: Secondary | ICD-10-CM

## 2018-01-10 DIAGNOSIS — J9622 Acute and chronic respiratory failure with hypercapnia: Secondary | ICD-10-CM

## 2018-01-10 LAB — CBC
HCT: 43 % (ref 36.0–46.0)
HEMOGLOBIN: 12.5 g/dL (ref 12.0–15.0)
MCH: 32.6 pg (ref 26.0–34.0)
MCHC: 29.1 g/dL — ABNORMAL LOW (ref 30.0–36.0)
MCV: 112.3 fL — ABNORMAL HIGH (ref 80.0–100.0)
NRBC: 0 % (ref 0.0–0.2)
Platelets: 302 10*3/uL (ref 150–400)
RBC: 3.83 MIL/uL — AB (ref 3.87–5.11)
RDW: 16.3 % — ABNORMAL HIGH (ref 11.5–15.5)
WBC: 9.5 10*3/uL (ref 4.0–10.5)

## 2018-01-10 LAB — GLUCOSE, CAPILLARY
GLUCOSE-CAPILLARY: 134 mg/dL — AB (ref 70–99)
Glucose-Capillary: 149 mg/dL — ABNORMAL HIGH (ref 70–99)
Glucose-Capillary: 149 mg/dL — ABNORMAL HIGH (ref 70–99)
Glucose-Capillary: 130 mg/dL — ABNORMAL HIGH (ref 70–99)

## 2018-01-10 LAB — BASIC METABOLIC PANEL
ANION GAP: 4 — AB (ref 5–15)
BUN: 37 mg/dL — ABNORMAL HIGH (ref 6–20)
CALCIUM: 9.3 mg/dL (ref 8.9–10.3)
CHLORIDE: 111 mmol/L (ref 98–111)
CO2: 33 mmol/L — AB (ref 22–32)
Creatinine, Ser: 0.75 mg/dL (ref 0.44–1.00)
GFR calc Af Amer: >60 mL/min (ref >60)
GFR calc non Af Amer: >60 mL/min (ref >60)
GLUCOSE: 171 mg/dL — AB (ref 70–99)
Potassium: 4.5 mmol/L (ref 3.5–5.1)
Sodium: 148 mmol/L — ABNORMAL HIGH (ref 135–145)

## 2018-01-10 LAB — BRAIN NATRIURETIC PEPTIDE: B NATRIURETIC PEPTIDE 5: 594 pg/mL — AB (ref 0.0–100.0)

## 2018-01-10 MED ORDER — IPRATROPIUM-ALBUTEROL 0.5-2.5 (3) MG/3ML IN SOLN
3.0000 mL | Freq: Four times a day (QID) | RESPIRATORY_TRACT | Status: DC
  Administered 2018-01-10 – 2018-01-13 (×13): 3 mL via RESPIRATORY_TRACT
  Filled 2018-01-10 (×13): qty 3

## 2018-01-10 MED ORDER — BUDESONIDE 0.5 MG/2ML IN SUSP
0.5000 mg | Freq: Two times a day (BID) | RESPIRATORY_TRACT | Status: DC
  Administered 2018-01-10 – 2018-01-13 (×6): 0.5 mg via RESPIRATORY_TRACT
  Filled 2018-01-10 (×6): qty 2

## 2018-01-10 MED ORDER — FAMOTIDINE 20 MG PO TABS
20.0000 mg | ORAL_TABLET | Freq: Every day | ORAL | Status: DC
  Administered 2018-01-11 – 2018-01-12 (×2): 20 mg
  Filled 2018-01-10 (×2): qty 1

## 2018-01-10 MED ORDER — FUROSEMIDE 10 MG/ML IJ SOLN
40.0000 mg | Freq: Two times a day (BID) | INTRAMUSCULAR | Status: AC
  Administered 2018-01-10 – 2018-01-11 (×2): 40 mg via INTRAVENOUS
  Filled 2018-01-10 (×2): qty 4

## 2018-01-10 MED ORDER — ADULT MULTIVITAMIN W/MINERALS CH
1.0000 | ORAL_TABLET | Freq: Every day | ORAL | Status: DC
  Administered 2018-01-11 – 2018-01-20 (×9): 1 via ORAL
  Filled 2018-01-10 (×9): qty 1

## 2018-01-10 MED ORDER — POTASSIUM CHLORIDE CRYS ER 20 MEQ PO TBCR
40.0000 meq | EXTENDED_RELEASE_TABLET | Freq: Once | ORAL | Status: AC
  Administered 2018-01-10: 40 meq via ORAL
  Filled 2018-01-10: qty 2

## 2018-01-10 NOTE — ED Provider Notes (Signed)
Pt back on BIPAP , 5 min after she keeps taking the mask off , unable to keep on bipap, pt back on HFNC, RN notified

## 2018-01-10 NOTE — Progress Notes (Signed)
Patient will not wear bipap, so tried on HFNC with Precedex gtt, to try to put bipap on again. Precedex made patient very lethargic and it was turned off. Bipap replaced on face and patient did not wear it for 5 minutes. Bipap replaced and safety mitts applied.

## 2018-01-10 NOTE — Progress Notes (Signed)
PT Cancellation Note  Patient Details Name: Stephanie [name] MRN: [id number] DOB: 07/12/1960   Cancelled Treatment:    Reason Eval/Treat Not Completed: Other (comment).  Pt now on BiPap.  Pt received bath this afternoon.  Will re-attempt PT evaluation tomorrow as medically appropriate.  Leitha Bleak, PT 01/10/18, 3:21 PM [phone]

## 2018-01-10 NOTE — Progress Notes (Signed)
Patient off Bipap a few times during shift for a few hours. Was short of breath in the morning, after her bath, and in the evening and so was placed back on bipap at those times. Was very confused in the morning but got more oriented throughout the shift. Foley catheter removed and Purewick placed instead.

## 2018-01-10 NOTE — Consult Note (Addendum)
Pharmacy Electrolyte Monitoring Consult:  Pharmacy consulted to assist in monitoring and replacing electrolytes in this 57 y.o. female admitted on 01/01/2018 with Sepsis. Patient has hypercapnic respiratory failure and lower extremity cellulitis.   Labs:  Sodium (mmol/L)  Date Value  01/10/2018 148 (H)   Potassium (mmol/L)  Date Value  01/10/2018 4.5   Magnesium (mg/dL)  Date Value  01/08/2018 2.2   Phosphorus (mg/dL)  Date Value  01/08/2018 3.9   Calcium (mg/dL)  Date Value  01/10/2018 9.3   Albumin (g/dL)  Date Value  01/06/2018 2.3 (L)    Assessment/Plan: Electrolytes: Will replace for goal potassium ~4, goal magnesium ~ 2, and goal phosphorus > 2.5.   Sodium is high today. Patient scheduled furosemide 40 mg IV x 2. Stopped D5W continuous infusion on 11/10.   Will order electrolytes with AM labs.  Glucose:  Patient is not diabetic. BG for last 24 hours 137-171.  Methylprednisolone 40 mg IV q12h was discontinued on 11/11.  Constipation: Last recorded BM 11/10. Patient has PRN Miralax ordered..   Pharmacy will continue to monitor and adjust per consult.     Suzzanne Cloud,  Pharmacy Student 01/10/2018 11:05 AM

## 2018-01-10 NOTE — Progress Notes (Signed)
PT Cancellation Note  Patient Details Name: Stephanie [name] MRN: [id number] DOB: 1960/04/02   Cancelled Treatment:    Reason Eval/Treat Not Completed: Other (comment).  Pt currently busy with nursing care (not available for PT session).  Will re-attempt PT evaluation at a later date/time.  Leitha Bleak, PT 01/10/18, 2:04 PM [phone]

## 2018-01-10 NOTE — Progress Notes (Signed)
Remains intermittently BiPAP dependent Cognition appears intact Presently comfortable on [place] O2  Vitals:   01/10/18 0825 01/10/18 0900 01/10/18 0924 01/10/18 1232  BP:  139/82    Pulse:  71    Resp: (!) 29 17    Temp:      TempSrc:      SpO2:  93% 95% 97%  Weight:      Height:        NAD RASS 0 HEENT WNL JVP not visualized Monophonic wheezing emanating from neck Distant at bedtime, no M noted Extremely obese, soft Severe symmetric bilateral LE edema with chronic stasis changes RLE skin breakdown with weeping ulcerations Moves all extremities, no focal deficits  BMP Latest Ref Rng & Units 01/10/2018 01/08/2018 01/07/2018  Glucose 70 - 99 mg/dL 171(H) 155(H) 126(H)  BUN 6 - 20 mg/dL 37(H) 28(H) 28(H)  Creatinine 0.44 - 1.00 mg/dL 0.75 0.79 0.71  Sodium 135 - 145 mmol/L 148(H) 145 146(H)  Potassium 3.5 - 5.1 mmol/L 4.5 4.4 3.7  Chloride 98 - 111 mmol/L 111 109 112(H)  CO2 22 - 32 mmol/L 33(H) 31 32  Calcium 8.9 - 10.3 mg/dL 9.3 9.1 8.6(L)    CBC Latest Ref Rng & Units 01/10/2018 01/07/2018 01/06/2018  WBC 4.0 - 10.5 K/uL 9.5 8.7 8.8  Hemoglobin 12.0 - 15.0 g/dL 12.5 11.1(L) 11.9(L)  Hematocrit 36.0 - 46.0 % 43.0 37.6 41.5  Platelets 150 - 400 K/uL 302 230 220   Results for orders placed or performed during the hospital encounter of 01/01/18  Blood Culture (routine x 2)     Status: None   Collection Time: 01/01/18  5:09 PM  Result Value Ref Range Status   Specimen Description BLOOD LT [facility]  Final   Special Requests   Final    BOTTLES DRAWN AEROBIC AND ANAEROBIC Blood Culture results may not be optimal due to an excessive volume of blood received in culture bottles   Culture   Final    NO GROWTH 5 DAYS Performed at [facility], [address] [place], [place] [address]    Report Status 01/06/2018 FINAL  Final  Blood Culture (routine x 2)     Status: None   Collection Time: 01/01/18  5:09 PM  Result Value Ref Range Status   Specimen Description BLOOD LT HAND   Final   Special Requests   Final    BOTTLES DRAWN AEROBIC AND ANAEROBIC Blood Culture adequate volume   Culture   Final    NO GROWTH 5 DAYS Performed at [facility], [address] [place], [place] [address]    Report Status 01/06/2018 FINAL  Final  MRSA PCR Screening     Status: Abnormal   Collection Time: 01/01/18  9:58 PM  Result Value Ref Range Status   MRSA by PCR POSITIVE (A) NEGATIVE Final    Comment:        The GeneXpert MRSA Assay (FDA approved for NASAL specimens only), is one component of a comprehensive MRSA colonization surveillance program. It is not intended to diagnose MRSA infection nor to guide or monitor treatment for MRSA infections. RESULT CALLED TO, READ BACK BY AND VERIFIED WITH: FELICIA PREDUHOMME ON 38/2/50 AT [address] [facility] Performed at [facility], [address] [place],  [address]    Anti-infectives (From admission, onward)   Start     Dose/Rate Route Frequency Ordered Stop   01/06/18 0000  vancomycin (VANCOCIN) 1,250 mg in sodium chloride 0.9 % 250 mL IVPB  1,250 mg 166.7 mL/hr over 90 Minutes Intravenous Every 12 hours 01/05/18 1210 01/07/18 1424   01/04/18 0000  vancomycin (VANCOCIN) 1,500 mg in sodium chloride 0.9 % 500 mL IVPB  Status:  Discontinued     1,500 mg 250 mL/hr over 120 Minutes Intravenous Every 12 hours 01/03/18 2021 01/05/18 1210   01/02/18 2000  vancomycin (VANCOCIN) 1,250 mg in sodium chloride 0.9 % 250 mL IVPB  Status:  Discontinued     1,250 mg 166.7 mL/hr over 90 Minutes Intravenous Every 8 hours 01/02/18 1204 01/03/18 2021   01/02/18 0000  vancomycin (VANCOCIN) 1,750 mg in sodium chloride 0.9 % 500 mL IVPB  Status:  Discontinued     1,750 mg 250 mL/hr over 120 Minutes Intravenous Every 12 hours 01/01/18 2118 01/02/18 1204   01/01/18 2130  vancomycin (VANCOCIN) IVPB 750 mg/150 ml premix     750 mg 150 mL/hr over 60 Minutes Intravenous  Once 01/01/18 2116 01/01/18 2339   01/01/18 1830  ceFEPIme  (MAXIPIME) 2 g in sodium chloride 0.9 % 100 mL IVPB     2 g 200 mL/hr over 30 Minutes Intravenous Every 12 hours 01/01/18 1826 01/07/18 2106   01/01/18 1730  vancomycin (VANCOCIN) IVPB 1000 mg/200 mL premix     1,000 mg 200 mL/hr over 60 Minutes Intravenous  Once 01/01/18 1726 01/01/18 1917   01/01/18 1730  cefTRIAXone (ROCEPHIN) 2 g in sodium chloride 0.9 % 100 mL IVPB  Status:  Discontinued     2 g 200 mL/hr over 30 Minutes Intravenous Every 24 hours 01/01/18 1726 01/01/18 1814      CXR: No new film  IMPRESSION: Acute/chronic hypercarbic respiratory failure Pickwickian syndrome Last CXR consistent with pulmonary edema Hypervolemia I/Os markedly positive over this hospitalization RLE cellulitis treated UA pseudowheezes - possible VCD Hypertension, controlled Elevated LFTs  PLAN/REC: Continue supplemental oxygen carefully titrated to maintain SPO2 88-94% Continue BiPAP as needed during day Continue mandatory nocturnal BiPAP Furosemide x 2 doses 11/11 Monitor BMET intermittently Monitor I/Os Correct electrolytes as indicated Recheck CXR 11/12 Discontinue systemic steroids Initiate nebulized steroids and scheduled bronchodilators 11/11 SDU status DNR/DNI  Merton Border, MD PCCM service Mobile [phone] Pager [phone] 01/10/2018 1:55 PM

## 2018-01-10 NOTE — Progress Notes (Signed)
[place] at [place] NAME: Stephanie [name]    MR#:  [id number]  DATE OF BIRTH:  December 01, 1960  SUBJECTIVE:  On BIPAP REVIEW OF SYSTEMS:   Review of Systems  Constitutional: Negative for chills, fever and weight loss.  HENT: Negative for ear discharge, ear pain and nosebleeds.   Eyes: Negative for blurred vision, pain and discharge.  Respiratory: Negative for sputum production, shortness of breath, wheezing and stridor.   Cardiovascular: Negative for chest pain, palpitations, orthopnea and PND.  Gastrointestinal: Negative for abdominal pain, diarrhea, nausea and vomiting.  Genitourinary: Negative for frequency and urgency.  Musculoskeletal: Negative for back pain and joint pain.  Neurological: Negative for sensory change, speech change, focal weakness and weakness.  Psychiatric/Behavioral: Negative for depression and hallucinations. The patient is not nervous/anxious.    Tolerating Diet:regular Tolerating PT:on BIPAP  DRUG ALLERGIES:  No Known Allergies  VITALS:  Blood pressure (!) 158/86, pulse 93, temperature 98.4 F (36.9 C), temperature source Oral, resp. rate (!) 24, height 5\' 4"  (1.626 m), weight (!) 231 kg, SpO2 97 %.  PHYSICAL EXAMINATION:   Physical Exam  GENERAL:  57 y.o.-year-old patient lying in the bed with no acute distress. Morbidly obese EYES: Pupils equal, round, reactive to light and accommodation. No scleral icterus. Extraocular muscles intact.  HEENT: Head atraumatic, normocephalic. Oropharynx and nasopharynx clear.HFNC BIPAP+NECK:  Supple, no jugular venous distention. No thyroid enlargement, no tenderness.  LUNGS: Normal breath sounds bilaterally, no wheezing, rales, rhonchi. No use of accessory muscles of respiration.  CARDIOVASCULAR: S1, S2 normal. No murmurs, rubs, or gallops.  ABDOMEN: Soft, nontender, nondistended. Bowel sounds present. No organomegaly or mass.  EXTREMITIES: bilateral lower extremity  lymphedema NEUROLOGIC: overall weak but no gross focal deficit PSYCHIATRIC: awakens on VC SKIN: as above  LABORATORY PANEL:  CBC Recent Labs  Lab 01/10/18 0423  WBC 9.5  HGB 12.5  HCT 43.0  PLT 302    Chemistries  Recent Labs  Lab 01/06/18 0501  01/08/18 0329 01/10/18 0423  NA 149*   < > 145 148*  K 4.0   < > 4.4 4.5  CL 113*   < > 109 111  CO2 31   < > 31 33*  GLUCOSE 115*   < > 155* 171*  BUN 28*   < > 28* 37*  CREATININE 0.81   < > 0.79 0.75  CALCIUM 8.6*   < > 9.1 9.3  MG 2.1  --  2.2  --   AST 124*  --   --   --   ALT 135*  --   --   --   ALKPHOS 38  --   --   --   BILITOT 1.0  --   --   --    < > = values in this interval not displayed.   Cardiac Enzymes No results for input(s): TROPONINI in the last 168 hours. RADIOLOGY:  No results found. ASSESSMENT AND PLAN:  Stephanie [name]  is a 57 y.o. female with a known history of hypertension, morbid obesity presented to the emergency room due to extreme fatigue, falls.  Patient was found to have multiple areas of skin breakdown.  Tachycardia, hypoxic respiratory failure. She has had trouble with a chronic wound on her right leg which has gotten weaker over time.    * Sepsis secondary to right lower extremity cellulitis. likely has chronic edema secondary to massive obesity/chronic lymphedema. -On broad-spectrum antibiotics with cefepime---now d/ced -Blood  culture so far negative. -MRSA PCR positive  *Acute hypoxic respiratory failure in the setting of sepsis with obesity hypoventilation syndrome -patient was falling asleep and not able to protect her areas. She got intubated and placed on the ventilator--now extubated -BIPAP As needed -appreciate ICU attending input -high dose IV steroids--on HFNC--to [place] with prn BIPAP -discussed with ICU attending. Patient is a chronic CO2 retainer. She will benefit from noninvasive ventilation at home. Will ask care management for help  *Acute kidney injury secondary to  sepsis and Rhabdomyolysis -IV fluids.   -creat better Elevated sodium  150--149--146  *Hypertension.  Blood pressure in the normal range.  Hold medications.  *Elevated troponin due to rhabdomyolysis and demand ischemia No chest pain EKG shows no ST changes  *Acute rhabdomyolysis due to recurrent falls.  -CPK trending down   Case discussed with Care Management/Social Worker. Management plans discussed with the patient, family and they are in agreement.  CODE STATUS: full  DVT Prophylaxis: Lovenox  TOTAL TIME TAKING CARE OF THIS PATIENT: *30* minutes.  >50% time spent on counselling and coordination of care  POSSIBLE D/C IN ?? DAYS, DEPENDING ON CLINICAL CONDITION.  Note: This dictation was prepared with Dragon dictation along with smaller phrase technology. Any transcriptional errors that result from this process are unintentional.  Fritzi Mandes M.D on 01/10/2018 at 3:50 PM  Between 7am to 6pm - Pager - [phone]  After 6pm go to [email or web address] - password [email or web address]  [facility]  Office  [phone]  CC: Primary care physician; Patient, No Pcp PerPatient ID: Stephanie [name], female   DOB: 02/27/1961, 57 y.o.   MRN: [id number]

## 2018-01-10 NOTE — Progress Notes (Signed)
Had to restart Precedex gtt to get patient to wear bipap. Patient finally was able to rest and tolerate bipap. More alert and less confused this AM, but disoriented.

## 2018-01-11 ENCOUNTER — Inpatient Hospital Stay: Payer: BLUE CROSS/BLUE SHIELD

## 2018-01-11 DIAGNOSIS — J9811 Atelectasis: Secondary | ICD-10-CM

## 2018-01-11 DIAGNOSIS — M6282 Rhabdomyolysis: Secondary | ICD-10-CM

## 2018-01-11 LAB — GLUCOSE, CAPILLARY
GLUCOSE-CAPILLARY: 114 mg/dL — AB (ref 70–99)
Glucose-Capillary: 102 mg/dL — ABNORMAL HIGH (ref 70–99)
Glucose-Capillary: 106 mg/dL — ABNORMAL HIGH (ref 70–99)
Glucose-Capillary: 108 mg/dL — ABNORMAL HIGH (ref 70–99)
Glucose-Capillary: 117 mg/dL — ABNORMAL HIGH (ref 70–99)
Glucose-Capillary: 130 mg/dL — ABNORMAL HIGH (ref 70–99)

## 2018-01-11 LAB — COMPREHENSIVE METABOLIC PANEL
ALBUMIN: 2.9 g/dL — AB (ref 3.5–5.0)
ALK PHOS: 38 U/L (ref 38–126)
ALT: 87 U/L — AB (ref 0–44)
ANION GAP: 5 (ref 5–15)
AST: 36 U/L (ref 15–41)
BUN: 42 mg/dL — AB (ref 6–20)
CALCIUM: 9.5 mg/dL (ref 8.9–10.3)
CO2: 34 mmol/L — AB (ref 22–32)
CREATININE: 0.8 mg/dL (ref 0.44–1.00)
Chloride: 110 mmol/L (ref 98–111)
GFR calc Af Amer: >60 mL/min (ref >60)
GFR calc non Af Amer: >60 mL/min (ref >60)
GLUCOSE: 124 mg/dL — AB (ref 70–99)
Potassium: 4.4 mmol/L (ref 3.5–5.1)
SODIUM: 149 mmol/L — AB (ref 135–145)
Total Bilirubin: 1 mg/dL (ref 0.3–1.2)
Total Protein: 6.8 g/dL (ref 6.5–8.1)

## 2018-01-11 LAB — TSH: TSH: 2.042 u[IU]/mL (ref 0.350–4.500)

## 2018-01-11 LAB — CBC
HCT: 42.4 % (ref 36.0–46.0)
HEMOGLOBIN: 12.5 g/dL (ref 12.0–15.0)
MCH: 32.3 pg (ref 26.0–34.0)
MCHC: 29.5 g/dL — ABNORMAL LOW (ref 30.0–36.0)
MCV: 109.6 fL — ABNORMAL HIGH (ref 80.0–100.0)
Platelets: 300 10*3/uL (ref 150–400)
RBC: 3.87 MIL/uL (ref 3.87–5.11)
RDW: 16.4 % — ABNORMAL HIGH (ref 11.5–15.5)
WBC: 9.8 10*3/uL (ref 4.0–10.5)
nRBC: 0 % (ref 0.0–0.2)

## 2018-01-11 MED ORDER — FUROSEMIDE 10 MG/ML IJ SOLN
40.0000 mg | Freq: Two times a day (BID) | INTRAMUSCULAR | Status: AC
  Administered 2018-01-11 – 2018-01-12 (×2): 40 mg via INTRAVENOUS
  Filled 2018-01-11 (×2): qty 4

## 2018-01-11 MED ORDER — METOPROLOL TARTRATE 5 MG/5ML IV SOLN
5.0000 mg | Freq: Four times a day (QID) | INTRAVENOUS | Status: DC
  Administered 2018-01-11 – 2018-01-13 (×7): 5 mg via INTRAVENOUS
  Filled 2018-01-11 (×7): qty 5

## 2018-01-11 MED ORDER — DEXTROSE 5 % IV SOLN
INTRAVENOUS | Status: DC
  Administered 2018-01-11: 30 mL/h via INTRAVENOUS

## 2018-01-11 MED ORDER — ENSURE ENLIVE PO LIQD
237.0000 mL | Freq: Three times a day (TID) | ORAL | Status: DC
  Administered 2018-01-12 – 2018-01-20 (×15): 237 mL via ORAL

## 2018-01-11 NOTE — Consult Note (Signed)
Pharmacy Electrolyte Monitoring Consult:  Pharmacy consulted to assist in monitoring and replacing electrolytes in this 57 y.o. female admitted on 01/01/2018 with Sepsis. Patient has hypercapnic respiratory failure and lower extremity cellulitis.   Labs:  Sodium (mmol/L)  Date Value  01/11/2018 149 (H)   Potassium (mmol/L)  Date Value  01/11/2018 4.4   Magnesium (mg/dL)  Date Value  01/08/2018 2.2   Phosphorus (mg/dL)  Date Value  01/08/2018 3.9   Calcium (mg/dL)  Date Value  01/11/2018 9.5   Albumin (g/dL)  Date Value  01/11/2018 2.9 (L)    Assessment/Plan: Electrolytes: Will replace for goal potassium ~4, goal magnesium ~ 2, and goal phosphorus > 2.5.   Sodium is still high today. Patient received furosemide 40 mg IV x 2 and has 2 more doses of furosemide 40 mg IV scheduled. Restarted D5W continuous infusion on 11/12.   Will order electrolytes with AM labs.  Glucose:  Patient is not diabetic. BG for last 24 hours 108-134.   Constipation:  Patient has scheduled ENSURE ENLIVE liquid 237 mL PO TID between meals scheduled.  Last recorded BM 11/10. Patient has PRN Miralax ordered.  Pharmacy will continue to monitor and adjust per consult.     Suzzanne Cloud,  Pharmacy Student 01/11/2018 11:46 AM

## 2018-01-11 NOTE — Progress Notes (Signed)
Nutrition Follow-up  DOCUMENTATION CODES:   Morbid obesity  INTERVENTION:  Liberalized diet to regular.  Provide Ensure Enlive po TID, each supplement provides 350 kcal and 20 grams of protein.  Continue daily MVI.  NUTRITION DIAGNOSIS:   Inadequate oral intake related to inability to eat as evidenced by NPO status.  Ongoing inadequate intake but diet has been advanced.  GOAL:   Patient will meet greater than or equal to 90% of their needs  Progressing.  MONITOR:   Diet advancement, Labs, Weight trends, Skin, I & O's  REASON FOR ASSESSMENT:   Ventilator    ASSESSMENT:   57 year old female with PMHx of morbid obesity, HTN who is admitted with acute hypercapnic respiratory failure with probable obesity hypoventilation syndrome requiring intubation on 11/3, lower extremity cellulitis.  Patient was extubated on 11/8. Diet was advanced to carbohydrate modified on 11/9. She has been on and off BiPAP so has not been able to eat very much. Per RN she is now able to stay off BiPAP for 2-3 hours at a time and had a little to drink this morning. As patient does not have diabetes plan is to liberalize diet to regular. Also starting patient on ONS today to help meet calorie/protein needs.  Medications reviewed and include: famotidine, Lasix 40 mg BID IV, MVI daily, D5W at 30 mL/hr.  Labs reviewed: CBG 102-134, Sodium 149, CO2 34, BUN 42.  Discussed with RN and on rounds.  Diet Order:   Diet Order            Diet regular Room service appropriate? Yes; Fluid consistency: Thin  Diet effective now              EDUCATION NEEDS:   No education needs have been identified at this time  Skin:  Skin Assessment: Skin Integrity Issues:(venous stasis ulcer right leg; MSAD to abdomen and groin)  Last BM:  11/8- type 6  Height:   Ht Readings from Last 1 Encounters:  01/01/18 5\' 4"  (1.626 m)    Weight:   Wt Readings from Last 1 Encounters:  01/10/18 (!) 231 kg    Ideal  Body Weight:  54.5 kg  BMI:  Body mass index is 87.41 kg/m.  Estimated Nutritional Needs:   Kcal:  2800-3100kcal/day   Protein:  >136g/day   Fluid:  >1.6L/day   [name], MS, RD, LDN Office: [phone] Pager: [phone] After Hours/Weekend Pager: [phone]

## 2018-01-11 NOTE — Progress Notes (Signed)
[place] at [place] NAME: Stephanie [name]    MR#:  [id number]  DATE OF BIRTH:  Jan 20, 1961  SUBJECTIVE:  On BIPAP REVIEW OF SYSTEMS:   Review of Systems  Constitutional: Negative for chills, fever and weight loss.  HENT: Negative for ear discharge, ear pain and nosebleeds.   Eyes: Negative for blurred vision, pain and discharge.  Respiratory: Negative for sputum production, shortness of breath, wheezing and stridor.   Cardiovascular: Negative for chest pain, palpitations, orthopnea and PND.  Gastrointestinal: Negative for abdominal pain, diarrhea, nausea and vomiting.  Genitourinary: Negative for frequency and urgency.  Musculoskeletal: Negative for back pain and joint pain.  Neurological: Negative for sensory change, speech change, focal weakness and weakness.  Psychiatric/Behavioral: Negative for depression and hallucinations. The patient is not nervous/anxious.    Tolerating Diet:regular Tolerating PT:on BIPAP  DRUG ALLERGIES:  No Known Allergies  VITALS:  Blood pressure (!) 177/87, pulse 93, temperature 98.8 F (37.1 C), temperature source Oral, resp. rate (!) 23, height 5\' 4"  (1.626 m), weight (!) 231 kg, SpO2 92 %.  PHYSICAL EXAMINATION:   Physical Exam  GENERAL:  57 y.o.-year-old patient lying in the bed with no acute distress. Morbidly obese EYES: Pupils equal, round, reactive to light and accommodation. No scleral icterus. Extraocular muscles intact.  HEENT: Head atraumatic, normocephalic. Oropharynx and nasopharynx clear.HFNC BIPAP+NECK:  Supple, no jugular venous distention. No thyroid enlargement, no tenderness.  LUNGS: Normal breath sounds bilaterally, no wheezing, rales, rhonchi. No use of accessory muscles of respiration.  CARDIOVASCULAR: S1, S2 normal. No murmurs, rubs, or gallops.  ABDOMEN: Soft, nontender, nondistended. Bowel sounds present. No organomegaly or mass.  EXTREMITIES: bilateral lower extremity  lymphedema NEUROLOGIC: overall weak but no gross focal deficit PSYCHIATRIC: awakens on VC SKIN: as above  LABORATORY PANEL:  CBC Recent Labs  Lab 01/11/18 0459  WBC 9.8  HGB 12.5  HCT 42.4  PLT 300    Chemistries  Recent Labs  Lab 01/08/18 0329  01/11/18 0459  NA 145   < > 149*  K 4.4   < > 4.4  CL 109   < > 110  CO2 31   < > 34*  GLUCOSE 155*   < > 124*  BUN 28*   < > 42*  CREATININE 0.79   < > 0.80  CALCIUM 9.1   < > 9.5  MG 2.2  --   --   AST  --   --  36  ALT  --   --  87*  ALKPHOS  --   --  38  BILITOT  --   --  1.0   < > = values in this interval not displayed.   Cardiac Enzymes No results for input(s): TROPONINI in the last 168 hours. RADIOLOGY:  Dg Chest Port 1 View  Result Date: 01/11/2018 CLINICAL DATA:  Respiratory failure EXAM: PORTABLE CHEST 1 VIEW COMPARISON:  01/06/2018 FINDINGS: Endotracheal tube removed.  Left arm PICC tip remains in the SVC. Cardiac enlargement. Improvement in vascular congestion and edema since the prior study. Improved aeration of the lung bases. No effusion IMPRESSION: Endotracheal tube removed. Improved aeration of the lungs with decrease vascular congestion and edema. Electronically Signed   By: Franchot Gallo M.D.   On: 01/11/2018 06:57   ASSESSMENT AND PLAN:  Stephanie [name]  is a 57 y.o. female with a known history of hypertension, morbid obesity presented to the emergency room due to extreme fatigue, falls.  Patient was found to have multiple areas of skin breakdown.  Tachycardia, hypoxic respiratory failure. She has had trouble with a chronic wound on her right leg which has gotten weaker over time.    * Sepsis secondary to right lower extremity cellulitis. likely has chronic edema secondary to massive obesity/chronic lymphedema. -was on broad-spectrum antibiotics with cefepime---now d/ced -Blood culture so far negative. -MRSA PCR positive  *Acute hypoxic respiratory failure in the setting of sepsis with obesity  hypoventilation syndrome -patient was falling asleep and not able to protect her areas. She got intubated and placed on the ventilator--now extubated -BIPAP As needed -appreciate ICU attending input -high dose IV steroids--on HFNC--to [place] with prn BIPAP -discussed with ICU attending. Patient is a chronic CO2 retainer. She will benefit from noninvasive ventilation at home. Will ask care management to see if she can get it.  *Acute kidney injury secondary to sepsis and Rhabdomyolysis -IV fluids.   -creat better Elevated sodium  150--149--146  *Hypertension.  Blood pressure in the normal range.  Hold medications.  *Elevated troponin due to rhabdomyolysis and demand ischemia No chest pain EKG shows no ST changes  *Acute rhabdomyolysis due to recurrent falls.  -CPK trending down   Case discussed with Care Management/Social Worker. Management plans discussed with the patient, family and they are in agreement.  CODE STATUS: full  DVT Prophylaxis: Lovenox  TOTAL TIME TAKING CARE OF THIS PATIENT: *30* minutes.  >50% time spent on counselling and coordination of care  POSSIBLE D/C IN ?? DAYS, DEPENDING ON CLINICAL CONDITION.  Note: This dictation was prepared with Dragon dictation along with smaller phrase technology. Any transcriptional errors that result from this process are unintentional.  Fritzi Mandes M.D on 01/11/2018 at 3:33 PM  Between 7am to 6pm - Pager - [phone]  After 6pm go to [email or web address] - password [email or web address]  [facility]  Office  [phone]  CC: Primary care physician; Patient, No Pcp PerPatient ID: Stephanie [name], female   DOB: [birth year], 57 y.o.   MRN: [id number]

## 2018-01-11 NOTE — Progress Notes (Signed)
PT Cancellation Note  Patient Details Name: Cynthis Purington MRN: [id number] DOB: 1960/05/03   Cancelled Treatment:    Reason Eval/Treat Not Completed: Patient not medically ready.  Per discussion with pt's nurse, pt back on Bi-pap and requiring bath for clean-up.  D/t pt back on Bi-pap, will hold PT at this time and re-attempt PT evaluation tomorrow as medically appropriate.  Leitha Bleak, PT 01/11/18, 2:03 PM [phone]

## 2018-01-11 NOTE — Progress Notes (Addendum)
Pulmonary CCM Follow Up  HPI: Stephanie [name]  is a 57 y.o. female with a known history of hypertension, morbid obesity presented to the emergency room due to extreme fatigue, falls.  Patient was found to have multiple areas of skin breakdown.  Tachycardia, hypoxic respiratory failure. She has had trouble with a chronic wound on her right leg which has gotten weaker over time.  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 She was found on the floor and brought in by EMS.  Patient was intubated and later on extubated on 01/07/2018, subsequent to that patient was managed for respiratory failure with BiPAP, finished antibiotic course, also had renal failure and rhabdomyolysis which improved  01/11/2018: Remains intermittently BiPAP dependent Cognition appears intact About 3300- for total 10 L positive -MRSA PCR positive so currently on isolation -Denies any other significant complaint  Vitals:   01/11/18 0900 01/11/18 1000 01/11/18 1100 01/11/18 1126  BP: (!) 161/86 (!) 174/90 (!) 181/84   Pulse: 99 97 99   Resp: (!) 23 (!) 23 (!) 25   Temp:      TempSrc:      SpO2: 95% 94% 92% 91%  Weight:      Height:        NAD RASS 0 HEENT WNL JVP not visualized Monophonic wheezing emanating from neck Distant at bedtime, no M noted Extremely obese, soft Severe symmetric bilateral LE edema with chronic stasis changes RLE skin breakdown with weeping ulcerations Moves all extremities, no focal deficits  BMP Latest Ref Rng & Units 01/11/2018 01/10/2018 01/08/2018  Glucose 70 - 99 mg/dL 124(H) 171(H) 155(H)  BUN 6 - 20 mg/dL 42(H) 37(H) 28(H)  Creatinine 0.44 - 1.00 mg/dL 0.80 0.75 0.79  Sodium 135 - 145 mmol/L 149(H) 148(H) 145  Potassium 3.5 - 5.1 mmol/L 4.4 4.5 4.4  Chloride 98 - 111 mmol/L 110 111 109  CO2 22 - 32 mmol/L 34(H) 33(H) 31  Calcium 8.9 - 10.3 mg/dL 9.5 9.3 9.1    CBC Latest Ref Rng & Units 01/11/2018 01/10/2018 01/07/2018  WBC 4.0 - 10.5 K/uL 9.8 9.5 8.7   Hemoglobin 12.0 - 15.0 g/dL 12.5 12.5 11.1(L)  Hematocrit 36.0 - 46.0 % 42.4 43.0 37.6  Platelets 150 - 400 K/uL 300 302 230   Results for orders placed or performed during the hospital encounter of 01/01/18  Blood Culture (routine x 2)     Status: None   Collection Time: 01/01/18  5:09 PM  Result Value Ref Range Status   Specimen Description BLOOD LT [facility]  Final   Special Requests   Final    BOTTLES DRAWN AEROBIC AND ANAEROBIC Blood Culture results may not be optimal due to an excessive volume of blood received in culture bottles   Culture   Final    NO GROWTH 5 DAYS Performed at [facility], [address], [place], [place] [address]    Report Status 01/06/2018 FINAL  Final  Blood Culture (routine x 2)     Status: None   Collection Time: 01/01/18  5:09 PM  Result Value Ref Range Status   Specimen Description BLOOD LT HAND  Final   Special Requests   Final    BOTTLES DRAWN AEROBIC AND ANAEROBIC Blood Culture adequate volume   Culture   Final    NO GROWTH 5 DAYS Performed at [facility], [address], [place], [place] [address]    Report Status 01/06/2018 FINAL  Final  MRSA  PCR Screening     Status: Abnormal   Collection Time: 01/01/18  9:58 PM  Result Value Ref Range Status   MRSA by PCR POSITIVE (A) NEGATIVE Final    Comment:        The GeneXpert MRSA Assay (FDA approved for NASAL specimens only), is one component of a comprehensive MRSA colonization surveillance program. It is not intended to diagnose MRSA infection nor to guide or monitor treatment for MRSA infections. RESULT CALLED TO, READ BACK BY AND VERIFIED WITH: FELICIA PREDUHOMME ON 95/0/93 AT 2356 [facility] Performed at [facility], [address] [place], [place] [address]    Anti-infectives (From admission, onward)   Start     Dose/Rate Route Frequency Ordered Stop   01/06/18 0000  vancomycin (VANCOCIN) 1,250 mg in sodium chloride 0.9 % 250 mL IVPB     1,250 mg 166.7 mL/hr  over 90 Minutes Intravenous Every 12 hours 01/05/18 1210 01/07/18 1424   01/04/18 0000  vancomycin (VANCOCIN) 1,500 mg in sodium chloride 0.9 % 500 mL IVPB  Status:  Discontinued     1,500 mg 250 mL/hr over 120 Minutes Intravenous Every 12 hours 01/03/18 2021 01/05/18 1210   01/02/18 2000  vancomycin (VANCOCIN) 1,250 mg in sodium chloride 0.9 % 250 mL IVPB  Status:  Discontinued     1,250 mg 166.7 mL/hr over 90 Minutes Intravenous Every 8 hours 01/02/18 1204 01/03/18 2021   01/02/18 0000  vancomycin (VANCOCIN) 1,750 mg in sodium chloride 0.9 % 500 mL IVPB  Status:  Discontinued     1,750 mg 250 mL/hr over 120 Minutes Intravenous Every 12 hours 01/01/18 2118 01/02/18 1204   01/01/18 2130  vancomycin (VANCOCIN) IVPB 750 mg/150 ml premix     750 mg 150 mL/hr over 60 Minutes Intravenous  Once 01/01/18 2116 01/01/18 2339   01/01/18 1830  ceFEPIme (MAXIPIME) 2 g in sodium chloride 0.9 % 100 mL IVPB     2 g 200 mL/hr over 30 Minutes Intravenous Every 12 hours 01/01/18 1826 01/07/18 2106   01/01/18 1730  vancomycin (VANCOCIN) IVPB 1000 mg/200 mL premix     1,000 mg 200 mL/hr over 60 Minutes Intravenous  Once 01/01/18 1726 01/01/18 1917   01/01/18 1730  cefTRIAXone (ROCEPHIN) 2 g in sodium chloride 0.9 % 100 mL IVPB  Status:  Discontinued     2 g 200 mL/hr over 30 Minutes Intravenous Every 24 hours 01/01/18 1726 01/01/18 1814      CXR: No new film  IMPRESSION: Acute/chronic hypercarbic respiratory failure Pickwickian syndrome Last CXR consistent with pulmonary edema Hypervolemia I/Os markedly positive over this hospitalization RLE cellulitis treated UA pseudowheezes - possible VCD Hypertension, controlled Elevated LFTs  PLAN/REC:    PLAN   Currently not on steroids-was treated with it before  Aggressive diuretic therapy keeping negative balance currently close to 10 L positive  Continue duoneb Q6 hr for today  Continue oxygen keep O2 sat>92  Use BIPAP at night and  PRN  Aspiration precation  Flu and pneumococal vaccine  Home CPAP/BIPAP use  Out patient PFT and out patient follow-up for sleep apnea  Due to patient's morbidly severe obesity hypercarbic respiratory failure agree with prior attendings assessment that patient will benefit from nocturnal ventilatory support, will require outpatient evaluation with sleep study and further work-up  GI DVT prophylaxis  Bronchial hygiene protocol  History of lower extremity cellulitis finished Vanco and cefepime  History of renal failure creatinine is doing better, sodium is 149 currently patient is  on D5 water, will monitor closely with diuresis  History of hypertension overall doing well  Elevated troponin: Deemed due to stress, echo with normal EF diastolic dysfunction, RV could not be visualized, keeping negative balance  Rhabdomyolysis last CPK level was 3500 on 11 5 will recheck  Stepdown status  Best Practice: Code Status: DNR Started on diet  OT and PT consultation  Out of bed to chair  GI prophylaxis: Pepcid VTE prophylaxis:  SQ heparin  FAMILY  - Updates: No family at bedside.  Spoke with patient at length  CCM time 32 min   NB: This document was prepared using Systems analyst and may include unintentional dictation errors. Continue supplemental oxygen carefully titrated to maintain SPO2 88-94%

## 2018-01-12 ENCOUNTER — Inpatient Hospital Stay: Payer: BLUE CROSS/BLUE SHIELD

## 2018-01-12 LAB — CBC WITH DIFFERENTIAL/PLATELET
Abs Immature Granulocytes: 0.12 10*3/uL — ABNORMAL HIGH (ref 0.00–0.07)
BASOS ABS: 0 10*3/uL (ref 0.0–0.1)
Basophils Relative: 0 %
EOS PCT: 2 %
Eosinophils Absolute: 0.1 10*3/uL (ref 0.0–0.5)
HEMATOCRIT: 41.8 % (ref 36.0–46.0)
HEMOGLOBIN: 12.5 g/dL (ref 12.0–15.0)
Immature Granulocytes: 2 %
LYMPHS ABS: 1.2 10*3/uL (ref 0.7–4.0)
LYMPHS PCT: 15 %
MCH: 32.8 pg (ref 26.0–34.0)
MCHC: 29.9 g/dL — ABNORMAL LOW (ref 30.0–36.0)
MCV: 109.7 fL — AB (ref 80.0–100.0)
MONO ABS: 0.9 10*3/uL (ref 0.1–1.0)
MONOS PCT: 10 %
Neutro Abs: 5.8 10*3/uL (ref 1.7–7.7)
Neutrophils Relative %: 71 %
Platelets: 287 10*3/uL (ref 150–400)
RBC: 3.81 MIL/uL — ABNORMAL LOW (ref 3.87–5.11)
RDW: 16.1 % — ABNORMAL HIGH (ref 11.5–15.5)
WBC: 8.2 10*3/uL (ref 4.0–10.5)
nRBC: 0 % (ref 0.0–0.2)

## 2018-01-12 LAB — COMPREHENSIVE METABOLIC PANEL
ALK PHOS: 39 U/L (ref 38–126)
ALT: 80 U/L — AB (ref 0–44)
AST: 38 U/L (ref 15–41)
Albumin: 2.9 g/dL — ABNORMAL LOW (ref 3.5–5.0)
Anion gap: 9 (ref 5–15)
BILIRUBIN TOTAL: 1 mg/dL (ref 0.3–1.2)
BUN: 34 mg/dL — ABNORMAL HIGH (ref 6–20)
CO2: 34 mmol/L — AB (ref 22–32)
CREATININE: 0.67 mg/dL (ref 0.44–1.00)
Calcium: 9 mg/dL (ref 8.9–10.3)
Chloride: 101 mmol/L (ref 98–111)
GFR calc Af Amer: >60 mL/min (ref >60)
GFR calc non Af Amer: >60 mL/min (ref >60)
Glucose, Bld: 263 mg/dL — ABNORMAL HIGH (ref 70–99)
Potassium: 4.1 mmol/L (ref 3.5–5.1)
SODIUM: 144 mmol/L (ref 135–145)
Total Protein: 6.7 g/dL (ref 6.5–8.1)

## 2018-01-12 LAB — BLOOD GAS, ARTERIAL
Acid-Base Excess: 14.9 mmol/L — ABNORMAL HIGH (ref 0.0–2.0)
BICARBONATE: 43.1 mmol/L — AB (ref 20.0–28.0)
Delivery systems: POSITIVE
EXPIRATORY PAP: 8
FIO2: 0.35
INSPIRATORY PAP: 18
O2 Saturation: 95 %
PCO2 ART: 68 mmHg — AB (ref 32.0–48.0)
PH ART: 7.41 (ref 7.350–7.450)
PO2 ART: 75 mmHg — AB (ref 83.0–108.0)
Patient temperature: 37

## 2018-01-12 LAB — GLUCOSE, CAPILLARY
GLUCOSE-CAPILLARY: 135 mg/dL — AB (ref 70–99)
Glucose-Capillary: 110 mg/dL — ABNORMAL HIGH (ref 70–99)
Glucose-Capillary: 117 mg/dL — ABNORMAL HIGH (ref 70–99)

## 2018-01-12 LAB — CK: Total CK: 143 U/L (ref 38–234)

## 2018-01-12 LAB — MAGNESIUM: Magnesium: 2 mg/dL (ref 1.7–2.4)

## 2018-01-12 LAB — BRAIN NATRIURETIC PEPTIDE: B Natriuretic Peptide: 261 pg/mL — ABNORMAL HIGH (ref 0.0–100.0)

## 2018-01-12 MED ORDER — LISINOPRIL 20 MG PO TABS
20.0000 mg | ORAL_TABLET | Freq: Every day | ORAL | Status: DC
  Administered 2018-01-12 – 2018-01-21 (×9): 20 mg via ORAL
  Filled 2018-01-12 (×10): qty 1

## 2018-01-12 NOTE — Progress Notes (Signed)
Pulmonary CCM Follow Up  HPI: Stephanie [name]  is a 57 y.o. female with a known history of hypertension, morbid obesity presented to the emergency room due to extreme fatigue, falls.  Patient was found to have multiple areas of skin breakdown.  Tachycardia, hypoxic respiratory failure. She has had trouble with a chronic wound on her right leg which has gotten weaker over time.  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 She was found on the floor and brought in by EMS.  Patient was intubated and later on extubated on 01/07/2018, subsequent to that patient was managed for respiratory failure with BiPAP, finished antibiotic course, also had renal failure and rhabdomyolysis which improved  01/12/2018: Remains intermittently BiPAP dependent currently oxygen by nasal cannula doing well Cognition appears intact About 3300- for total 0.5 L positive -MRSA PCR positive so currently on isolation -Denies any other significant complaint -Blood pressure was mildly elevated received PRN hydralazine -Sodium is improved to 144  Vitals:   01/12/18 0600 01/12/18 0700 01/12/18 0800 01/12/18 0900  BP: (!) 178/80 (!) 183/98 (!) 201/115 (!) 184/104  Pulse: 83 89 89 94  Resp: (!) 22 (!) 22 20 (!) 24  Temp:   99 F (37.2 C)   TempSrc:   Oral   SpO2: 94% 92% 95% 94%  Weight:      Height:        NAD RASS 0 HEENT WNL JVP not visualized Monophonic wheezing emanating from neck Distant at bedtime, no M noted Extremely obese, soft Severe symmetric bilateral LE edema with chronic stasis changes RLE skin breakdown with weeping ulcerations Moves all extremities, no focal deficits  BMP Latest Ref Rng & Units 01/12/2018 01/11/2018 01/10/2018  Glucose 70 - 99 mg/dL 263(H) 124(H) 171(H)  BUN 6 - 20 mg/dL 34(H) 42(H) 37(H)  Creatinine 0.44 - 1.00 mg/dL 0.67 0.80 0.75  Sodium 135 - 145 mmol/L 144 149(H) 148(H)  Potassium 3.5 - 5.1 mmol/L 4.1 4.4 4.5  Chloride 98 - 111 mmol/L 101 110 111   CO2 22 - 32 mmol/L 34(H) 34(H) 33(H)  Calcium 8.9 - 10.3 mg/dL 9.0 9.5 9.3    CBC Latest Ref Rng & Units 01/12/2018 01/11/2018 01/10/2018  WBC 4.0 - 10.5 K/uL 8.2 9.8 9.5  Hemoglobin 12.0 - 15.0 g/dL 12.5 12.5 12.5  Hematocrit 36.0 - 46.0 % 41.8 42.4 43.0  Platelets 150 - 400 K/uL 287 300 302   Results for orders placed or performed during the hospital encounter of 01/01/18  Blood Culture (routine x 2)     Status: None   Collection Time: 01/01/18  5:09 PM  Result Value Ref Range Status   Specimen Description BLOOD LT [facility]  Final   Special Requests   Final    BOTTLES DRAWN AEROBIC AND ANAEROBIC Blood Culture results may not be optimal due to an excessive volume of blood received in culture bottles   Culture   Final    NO GROWTH 5 DAYS Performed at [facility], [address], [place], [place] [address]    Report Status 01/06/2018 FINAL  Final  Blood Culture (routine x 2)     Status: None   Collection Time: 01/01/18  5:09 PM  Result Value Ref Range Status   Specimen Description BLOOD LT HAND  Final   Special Requests   Final    BOTTLES DRAWN AEROBIC AND ANAEROBIC Blood Culture adequate volume   Culture   Final    NO GROWTH 5  DAYS Performed at [facility], [address] [place], [place] [address]    Report Status 01/06/2018 FINAL  Final  MRSA PCR Screening     Status: Abnormal   Collection Time: 01/01/18  9:58 PM  Result Value Ref Range Status   MRSA by PCR POSITIVE (A) NEGATIVE Final    Comment:        The GeneXpert MRSA Assay (FDA approved for NASAL specimens only), is one component of a comprehensive MRSA colonization surveillance program. It is not intended to diagnose MRSA infection nor to guide or monitor treatment for MRSA infections. RESULT CALLED TO, READ BACK BY AND VERIFIED WITH: FELICIA PREDUHOMME ON 28/7/86 AT 2356 [facility] Performed at [facility], [address] [place], [place] [address]    Anti-infectives (From  admission, onward)   Start     Dose/Rate Route Frequency Ordered Stop   01/06/18 0000  vancomycin (VANCOCIN) 1,250 mg in sodium chloride 0.9 % 250 mL IVPB     1,250 mg 166.7 mL/hr over 90 Minutes Intravenous Every 12 hours 01/05/18 1210 01/07/18 1424   01/04/18 0000  vancomycin (VANCOCIN) 1,500 mg in sodium chloride 0.9 % 500 mL IVPB  Status:  Discontinued     1,500 mg 250 mL/hr over 120 Minutes Intravenous Every 12 hours 01/03/18 2021 01/05/18 1210   01/02/18 2000  vancomycin (VANCOCIN) 1,250 mg in sodium chloride 0.9 % 250 mL IVPB  Status:  Discontinued     1,250 mg 166.7 mL/hr over 90 Minutes Intravenous Every 8 hours 01/02/18 1204 01/03/18 2021   01/02/18 0000  vancomycin (VANCOCIN) 1,750 mg in sodium chloride 0.9 % 500 mL IVPB  Status:  Discontinued     1,750 mg 250 mL/hr over 120 Minutes Intravenous Every 12 hours 01/01/18 2118 01/02/18 1204   01/01/18 2130  vancomycin (VANCOCIN) IVPB 750 mg/150 ml premix     750 mg 150 mL/hr over 60 Minutes Intravenous  Once 01/01/18 2116 01/01/18 2339   01/01/18 1830  ceFEPIme (MAXIPIME) 2 g in sodium chloride 0.9 % 100 mL IVPB     2 g 200 mL/hr over 30 Minutes Intravenous Every 12 hours 01/01/18 1826 01/07/18 2106   01/01/18 1730  vancomycin (VANCOCIN) IVPB 1000 mg/200 mL premix     1,000 mg 200 mL/hr over 60 Minutes Intravenous  Once 01/01/18 1726 01/01/18 1917   01/01/18 1730  cefTRIAXone (ROCEPHIN) 2 g in sodium chloride 0.9 % 100 mL IVPB  Status:  Discontinued     2 g 200 mL/hr over 30 Minutes Intravenous Every 24 hours 01/01/18 1726 01/01/18 1814      CXR: No new film  IMPRESSION: Acute/chronic hypercarbic respiratory failure Pickwickian syndrome Last CXR consistent with pulmonary edema Hypervolemia I/Os markedly positive over this hospitalization RLE cellulitis treated UA pseudowheezes - Hypertension, poorly controlled Elevated LFTs improving  PLAN/REC:    PLAN   Currently not on steroids-was treated with it  before  Aggressive diuretic therapy keeping negative balance currently close to 6.5 L positive  Continue duoneb Q6 hr for today  Continue oxygen keep O2 sat>92  Use BIPAP at night and PRN  Aspiration precation  Flu and pneumococal vaccine  Home CPAP/BIPAP use  Out patient PFT and out patient follow-up for sleep apnea-will benefit from nocturnal ventilatory support on discharge  Due to patient's morbidly severe obesity hypercarbic respiratory failure agree with prior attendings assessment that patient will benefit from nocturnal ventilatory support, will require outpatient evaluation with sleep study and further work-up  GI  DVT prophylaxis  Bronchial hygiene protocol  History of lower extremity cellulitis finished Vanco and cefepime  History of renal failure creatinine is doing better, sodium is 144 currently patient is on D5 water, will monitor closely with diuresis-decrease D10 to 10 cc/h  History of hypertension poorly controlled noted, continue with metoprolol 5 every 6 IV, Norvasc 10, and lisinopril 20 mg daily  Elevated troponin: Deemed due to stress, echo with normal EF diastolic dysfunction, RV could not be visualized, keeping negative balance  Rhabdomyolysis last CPK level was 3500 on 11 5-recheck today is 143  Stepdown status-overall doing well if continue to do well off BiPAP can be transferred out of ICU with nocturnal BiPAP.  Best Practice: Code Status: DNR Started on diet  OT and PT consultation  Out of bed to chair  GI prophylaxis: Pepcid VTE prophylaxis:  SQ heparin  FAMILY  - Updates: No family at bedside.  Spoke with patient at length  CCM time 32 min   NB: This document was prepared using Systems analyst and may include unintentional dictation errors. Continue supplemental oxygen carefully titrated to maintain SPO2 88-94%

## 2018-01-12 NOTE — Care Management Note (Signed)
Case Management Note  Patient Details  Name: Stephanie [name] MRN: [id number] Date of Birth: 18-Sep-1960  Subjective/Objective:      Patient admitted on 01/01/18 for sepsis.  Patient was extubated and is now tolerating [place] and ready to transfer to the floor.  Patient reports that she is from home.  She shares a home with her mother.  Patient reports that she lives downstairs and her mother lives upstairs.  Patient reports that before this hospitalization she was independent in [employer]'s working at [employer].  Patient reports no previous medical conditions and was not taking any prescription medications.  Patient reports that her plan when discharged is to move in with her mother upstairs in the home- she states that there are 2 bathrooms and 2 bedrooms.  Patient would be agreeable to home health coming.  Patient has no equipment in the home because she was previously independent.   RNCM will cont to follow for discharge planning needs. Stephanie Clay RN BSN [phone]                Action/Plan:   Expected Discharge Date:                  Expected Discharge Plan:     In-House Referral:     Discharge planning Services  CM Consult  Post Acute Care Choice:    Choice offered to:     DME Arranged:    DME Agency:     HH Arranged:    HH Agency:     Status of Service:  In process, will continue to follow  If discussed at Long Length of Stay Meetings, dates discussed:    Additional Comments:  Stephanie Hutching, RN 01/12/2018, 2:26 PM

## 2018-01-12 NOTE — Consult Note (Signed)
[place] Nurse wound follow up Reconsulted.  Wounds with less drainage to abdomen and left anterior thigh.  Will stop calcium alginate for absorption as bleeding was noted with wound care today. Will begin contact layer dressing for atraumatic dressing removal.  Wound type:Trauma from difficult EMS retrieval at home, she relays.   Measurement: Both areas are 12 cm x 12 x o.1 cm  Wound bed: moist and pink Drainage (amount, consistency, odor)minimal serosanguinous  Periwound: intact Edema to lower legs Dressing procedure/placement/frequency:See above, keep skin fold management system in place for intertriginous breakdown.  Will not follow at this time.  Please re-consult if needed.  Domenic Moras MSN, RN, FNP-BC CWON Wound, Ostomy, Continence Nurse Pager [phone]

## 2018-01-12 NOTE — Consult Note (Signed)
Pharmacy Electrolyte Monitoring Consult:  Pharmacy consulted to assist in monitoring and replacing electrolytes in this 57 y.o. female admitted on 01/01/2018 with Sepsis. Patient has hypercapnic respiratory failure and lower extremity cellulitis.   Labs:  Sodium (mmol/L)  Date Value  01/12/2018 144   Potassium (mmol/L)  Date Value  01/12/2018 4.1   Magnesium (mg/dL)  Date Value  01/12/2018 2.0   Phosphorus (mg/dL)  Date Value  01/08/2018 3.9   Calcium (mg/dL)  Date Value  01/12/2018 9.0   Albumin (g/dL)  Date Value  01/12/2018 2.9 (L)    Assessment/Plan: Electrolytes: Will replace for goal potassium ~4, goal magnesium ~ 2, and goal phosphorus > 2.5.   Electrolytes within normal level. Electrolytes replacement not warranted now. Patient receiving continuous dextrose 5% solution. Patient received furosemide 40 mg IV x 2.   Will order electrolytes with AM labs.  Glucose:  Patient is not diabetic. BG for last 24 hours 106-263. 263 appears to be an outlier and does not represent patient's actual BG value.   Constipation:  Patient has scheduled ENSURE ENLIVE liquid 237 mL PO TID between meals scheduled.  Last recorded BM 11/12. Patient has PRN Miralax ordered.  Pharmacy will continue to monitor and adjust per consult.     Suzzanne Cloud,  Pharmacy Student 01/12/2018 1:21 PM

## 2018-01-12 NOTE — Evaluation (Signed)
Physical Therapy Evaluation Patient Details Name: Stephanie [name] MRN: [id number] DOB: 1961/03/01 Today's Date: 01/12/2018   History of Present Illness  Pt is a 57 y.o. female presenting to hospital 01/01/18 with falls (pt found on floor), fatigue, and increasing weakness.  Pt noted with multiple areas of skin breakdown (full thickness L abdomen; partial thickness L anterior thigh, R anterior LE at bend in foot and R anterior thigh).  Pt admitted with sepsis secondary R LE cellulitis, AKI, htn, elevated troponin d/t rhabdomyolysis and demand ischemia, conjunctivitis, and acute hypoxic respiratory failure.  Pt intubated 01/02/18 and extubated 01/07/18.  Imaging R tib/fib showing no definitive fx's.  Per notes, pt CO2 retainer.  PMH includes htn, obesity, and chronic wound R LE.  Clinical Impression  Prior to hospital admission, pt was ambulatory (no AD) and working.  Pt lives with her mother (has basement level living but reports could stay on main floor).  Unable to perform semi-supine to sit with 2 assist so lift utilized to perform semi-supine to/from sit and to provide support sitting edge of bed d/t posterior lean.  Pt's O2 sats 86-88% on 3 L O2 via nasal cannula sitting edge of bed (93% or greater on 3 L [place] beginning/end of session resting in bed).  Pt able to tolerate sitting edge of bed about 5 minutes (increasing SOB and fatigue noted with increased time sitting edge of bed).  L LE appearing much weaker compared to R LE.  Pt would benefit from skilled PT to address noted impairments and functional limitations (see below for any additional details).  Upon hospital discharge, recommend pt discharge to [facility].    Follow Up Recommendations SNF    Equipment Recommendations  (TBD)    Recommendations for Other Services OT consult     Precautions / Restrictions Precautions Precautions: Fall Precaution Comments: HOB >30 degrees; prevalon boot Restrictions Weight Bearing Restrictions: No       Mobility  Bed Mobility Overal bed mobility: Needs Assistance Bed Mobility: Supine to Sit;Sit to Supine     Supine to sit: Total assist;+2 for physical assistance;HOB elevated Sit to supine: Total assist;+2 for physical assistance;HOB elevated      Transfers                 General transfer comment: Deferred d/t respiratory status concerns and overall weakness  Ambulation/Gait                Stairs            Wheelchair Mobility    Modified Rankin (Stroke Patients Only)       Balance Overall balance assessment: Needs assistance Sitting-balance support: Bilateral upper extremity supported;Feet unsupported Sitting balance-Leahy Scale: Poor Sitting balance - Comments: lift assist for sitting balance d/t posterior lean                                     Pertinent Vitals/Pain Pain Assessment: No/denies pain  HR and BP WFL during session    Home Living Family/patient expects to be discharged to:: Private residence Living Arrangements: Parent(Pt's mother) Available Help at Discharge: Family Type of Home: House Home Access: Stairs to enter Entrance Stairs-Rails: Right;Left;Can reach both Technical brewer of Steps: 4 Home Layout: Multi-level(able to stay on main level with bedroom and toilet) Home Equipment: None      Prior Function Level of Independence: Independent         Comments:  Pt working Biomedical scientist)     Hand Dominance        Extremity/Trunk Assessment   Upper Extremity Assessment Upper Extremity Assessment: RUE deficits/detail;LUE deficits/detail RUE Deficits / Details: at least 3/5 AROM shoulder flexion (to 90 degrees) and elbow flexion/extension; good hand grip strength LUE Deficits / Details: at least 3/5 AROM shoulder flexion (to 90 degrees) and elbow flexion/extension; good hand grip strength    Lower Extremity Assessment Lower Extremity Assessment: RLE deficits/detail;LLE  deficits/detail RLE Deficits / Details: 2+/5 R hip flexion; at least 2+/5 knee flexion/extension; at least 3/5 DF LLE Deficits / Details: at least 1/5 L hip flexion; at least 1/5 knee flexion/extension; at least 3/5 DF    Cervical / Trunk Assessment Cervical / Trunk Assessment: Other exceptions Cervical / Trunk Exceptions: forward head  Communication   Communication: No difficulties  Cognition Arousal/Alertness: Awake/alert Behavior During Therapy: WFL for tasks assessed/performed Overall Cognitive Status: Within Functional Limits for tasks assessed                                        General Comments General comments (skin integrity, edema, etc.): bandages noted B LE's d/t wounds.  Nursing cleared pt for participation in physical therapy.  Pt agreeable to PT session.    Exercises     Assessment/Plan    PT Assessment Patient needs continued PT services  PT Problem List Decreased strength;Decreased activity tolerance;Decreased range of motion;Decreased balance;Decreased mobility;Decreased knowledge of use of DME;Cardiopulmonary status limiting activity;Decreased knowledge of precautions;Obesity;Decreased skin integrity       PT Treatment Interventions DME instruction;Gait training;Stair training;Functional mobility training;Therapeutic activities;Therapeutic exercise;Balance training;Patient/family education    PT Goals (Current goals can be found in the Care Plan section)  Acute Rehab PT Goals Patient Stated Goal: to be able to walk again PT Goal Formulation: With patient Time For Goal Achievement: 01/12/18 Potential to Achieve Goals: Fair    Frequency Min 2X/week   Barriers to discharge Decreased caregiver support      Co-evaluation               AM-PAC PT "6 Clicks" Daily Activity  Outcome Measure Difficulty turning over in bed (including adjusting bedclothes, sheets and blankets)?: Unable Difficulty moving from lying on back to sitting on  the side of the bed? : Unable Difficulty sitting down on and standing up from a chair with arms (e.g., wheelchair, bedside commode, etc,.)?: Unable Help needed moving to and from a bed to chair (including a wheelchair)?: Total Help needed walking in hospital room?: Total Help needed climbing 3-5 steps with a railing? : Total 6 Click Score: 6    End of Session Equipment Utilized During Treatment: Oxygen(3 L O2 via nasal cannula) Activity Tolerance: Patient limited by fatigue Patient left: in bed;with call bell/phone within reach;Other (comment)(nursing coming in for linen change) Nurse Communication: Mobility status;Precautions;Other (comment)(Pt's O2 sats during session) PT Visit Diagnosis: Other abnormalities of gait and mobility (R26.89);Muscle weakness (generalized) (M62.81);History of falling (Z91.81);Difficulty in walking, not elsewhere classified (R26.2)    Time: 5956-3875 PT Time Calculation (min) (ACUTE ONLY): 39 min   Charges:   PT Evaluation $PT Eval Low Complexity: 1 Low PT Treatments $Therapeutic Exercise: 8-22 mins        Leitha Bleak, PT 01/12/18, 12:32 PM [phone]

## 2018-01-12 NOTE — Plan of Care (Signed)
Pt transferred from ICU.  VSS, continue on 3L O2 per [place]. Denies pain.

## 2018-01-12 NOTE — Progress Notes (Signed)
Report called to [facility] on 1C, pt will transport on her bari bed to 1C w/supp O2 in place, chart and belongings and meds will go with her

## 2018-01-12 NOTE — Progress Notes (Signed)
Mepitel applied to wounds as ordered by [facility]

## 2018-01-12 NOTE — Consult Note (Signed)
Consultation Note Date: 01/12/2018   Patient Name: Stephanie [name]  DOB: [birth year]  MRN: [id number]  Age / Sex: 57 y.o., female  PCP: Patient, No Pcp Per Referring Physician: Vaughan Basta, *  Reason for Consultation: Establishing goals of care  HPI/Patient Profile: 57 y.o. female admitted on 01/01/2018 from home with complaints of fall due to extreme fatigue. She has a past medical history of hypertension, morbid obesity, and left leg ulceration (treated at wound clinic). Patient presented to ED after falling in the home and contacting EMS for assistance. Patient reported she initially fell and EMS came out to assist her up and left as she felt fine, however after getting up again attempting to walk she fell again and was short of breath. 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 During her ED course she was found to have multiple areas of skin breakdown, tachycardia, and hypoxic. Foley was placed with dark urine return. Chest X-ray showed no acute abnormalities. Right tib/fib x-ray showed no acute fractures. NA 148, K 5.4, Glucose 110, BUN 25, Cr 1.58, Albumin 2.7, AST 912, ALT 303, ABG ph7.16, pCO2 91, Bicarb 32.4, lactic acid 2.3, CK 27,818, troponin 0.24, WBC 10.8. Since admission patient required intubation for hypercapnic respiratory failure. She was successfully extubated on 11/8 to nasal cannula and nightly bipap support. Continues on cefepime and vancomycin  For lower extremity cellulitis, questionable lymphedema due to morbid obesity. She is being treated for hypertension and rhabdomyolysis. Palliative Medicine team consulted for goals of care discussion.   Clinical Assessment and Goals of Care: I have reviewed medical records including lab results, imaging, Epic notes, and MAR, received report from the bedside RN, and assessed the patient. I  then discussed with patient her current diagnosis, GOC, EOL wishes, disposition and options. Patient is tolerating 5L/[place] with some signs of shortness of breath during conversation. She is A&O x3. Denies pain. Offered to include family in goals of care discussion however patient declined and stated she would update other family members with her status.   I introduced Palliative Medicine as specialized medical care for people living with serious illness. It focuses on providing relief from the symptoms and stress of a serious illness. The goal is to improve quality of life for both the patient and the family.  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 She lives with her 54 year old mother. She reports she does not have any children or never been married. She has 5 siblings all who do not live locally except her one brother. She reports he is her and their moms main support system. Patient is of Christian faith.   Prior to admission patient reports she was able to care for herself independently. She also cared for her mom when needed, but reports she cares for herself mostly. Patient worked full time. She was ambulatory  without assistive devices. She reports she had a great appetite and admits that she doesn't eat the most healthiest foods. Patient states she has not seen an actual medical doctor routinely since about 2015 and she hasn't cared for herself the way she should. She is tearful in discussion and states "and now look at me!" Support was given.  Patient also reported she has been to the urgent care in the past few months who referred her to the wound clinic for her legs. She states the past few months she has experienced several episodes of her legs "giving out" and she had to slide herself to to chair or down.   We discussed her current illness and what it means in the larger context  of her on-going co-morbidities.  Natural disease trajectory and expectations at EOL were discussed. Patient verbalized understanding of her current condition. She reports she does not remember her hospitalization from time of admission up until the day after extubation.   I attempted to elicit values and goals of care important to the patient.    Patient expresses at this time she remains hopeful for improvement but is scared that she won't. She is tearful and feels she will rely on care the rest of her life or that she will not be able to ambulate again. Support and comfort was given, however, I did discuss with her in detail and honestly her current condition and trajectory. Patient verbalized understanding.   Patient states she does not have a formal directive however her brother, Gwenlyn Fudge would be her medical decision maker. She reports she would like to talk to him more in detail and make sure he understands her wishes and approval to serve as her POA prior to completion of advanced directives. Patient informed once she speaks with him she may notify nurse and we can have Chaplain services assist her with the completion of her documents. She verbalized understanding.   After detailed discussion patient confirms her wishes for DNR/DNI and no artifical feedings such as PEG tube.   She verbalizes awareness of most likely needing SNF versus in home health care at discharge. She states she currently lives in her mom's basement however she will have to relocate herself up into the main part of the house once she does return home because she knows she will not be able to go up and down the steps and that is where the kitchen and bathrooms are. She reports her brother will assist with getting her situated once she does return home.   Hospice and Palliative Care services outpatient were explained and offered. Given patient's expressed wishes to continue to treat the treatable with hopes of some  improvement patient is most appropriate for outpatient Palliative support. She verbalized understanding and wishes for their involvement outpatient.   Questions and concerns were addressed. The family was encouraged to call with questions or concerns.  PMT will continue to support holistically.   SUMMARY OF RECOMMENDATIONS    DNR/DNI-as confirmed by patient  Continue to treat the treatable with hopes of some signs of improvement.   Patient expresses wishes to have outpatient palliative and goal to attend rehab to allow a chance to improve. She verbalizes she is aware that she will have to make significant lifestyle adjustments.   Case Manager referral for outpatient palliative.   Palliative Medicine team will continue to support patient, family, and medical team during hospitalization.   Code Status/Advance Care Planning:  DNR  Palliative Prophylaxis:  Aspiration, Bowel Regimen, Frequent Pain Assessment, Palliative Wound Care and Turn Reposition  Additional Recommendations (Limitations, Scope, Preferences):  Full Scope Treatment and No Artificial Feeding-Continue to treat the treatable   Psycho-social/Spiritual:   Desire for further Chaplaincy support:NO   Additional Recommendations: Caregiving  Support/Resources, Medicaid/Financial Assistance and Referral to [employer]   Prognosis:   Unable to determine-Guarded to Poor in the setting of morbid obesity, hypertension, thabdomyolysis, AKI, generalized weakness, sepsis, hypercapnic respiratory failure, deconditioning, decreased mobility, multiple areas of skin breakdown, lymphedema, and poor health maintenance.   Discharge Planning: [facility] for rehab with Palliative care service follow-up      Primary Diagnoses: Present on Admission: . Sepsis ([place])   I have reviewed the medical record, interviewed the patient and family, and examined the patient. The following aspects are pertinent.  Past  Medical History:  Diagnosis Date  . Hypertension   . Obesity    Social History   Socioeconomic History  . Marital status: Single    Spouse name: Not on file  . Number of children: Not on file  . Years of education: Not on file  . Highest education level: Not on file  Occupational History  . Not on file  Social Needs  . Financial resource strain: Not on file  . Food insecurity:    Worry: Not on file    Inability: Not on file  . Transportation needs:    Medical: Not on file    Non-medical: Not on file  Tobacco Use  . Smoking status: Never Smoker  . Smokeless tobacco: Never Used  Substance and Sexual Activity  . Alcohol use: Not Currently  . Drug use: Not Currently  . Sexual activity: Not Currently  Lifestyle  . Physical activity:    Days per week: Not on file    Minutes per session: Not on file  . Stress: Not on file  Relationships  . Social connections:    Talks on phone: Not on file    Gets together: Not on file    Attends religious service: Not on file    Active member of club or organization: Not on file    Attends meetings of clubs or organizations: Not on file    Relationship status: Not on file  Other Topics Concern  . Not on file  Social History Narrative  . Not on file   History reviewed. No pertinent family history. Scheduled Meds: . amLODipine  10 mg Oral Daily  . budesonide  0.5 mg Nebulization BID  . chlorhexidine  15 mL Mouth Rinse BID  . enoxaparin (LOVENOX) injection  40 mg Subcutaneous Q12H  . famotidine  20 mg Per Tube Daily  . feeding supplement (ENSURE ENLIVE)  237 mL Oral TID BM  . ipratropium-albuterol  3 mL Nebulization Q6H  . lisinopril  20 mg Oral Daily  . mouth rinse  15 mL Mouth Rinse q12n4p  . metoprolol tartrate  5 mg Intravenous Q6H  . multivitamin with minerals  1 tablet Oral Daily  . sodium chloride flush  10-40 mL Intracatheter Q12H  . tobramycin  1 drop Both Eyes Q4H   Continuous Infusions: . sodium chloride Stopped  (01/07/18 1636)  . dextrose 5 mL/hr at 01/12/18 1200   PRN Meds:.sodium chloride, acetaminophen **OR** acetaminophen, albuterol, hydrALAZINE, polyethylene glycol, sodium chloride flush Medications Prior to Admission:  Prior to Admission medications   Medication Sig Start Date End Date Taking? Authorizing Provider  acetaminophen (TYLENOL) 500 MG tablet Take 500-1,000 mg by  mouth every 6 (six) hours as needed for headache or pain.   Yes [provider]   No Known Allergies Review of Systems  Constitutional: Positive for activity change, appetite change and fatigue.  Respiratory: Positive for shortness of breath and wheezing.   Cardiovascular: Positive for leg swelling.  Musculoskeletal: Positive for gait problem.       Falls   Skin: Positive for color change and wound.  Neurological: Positive for weakness.  All other systems reviewed and are negative.   Physical Exam  Constitutional: She is oriented to person, place, and time. Vital signs are normal. She is cooperative. She appears ill.  Obese, chronically ill appearing   Eyes:  Sclera red   Cardiovascular: Regular rhythm and normal heart sounds. Tachycardia present. Exam reveals decreased pulses.  Audible by doppler, weak, lower extremity edema    Pulmonary/Chest: Accessory muscle usage present. She has decreased breath sounds. She has wheezes.  Shortness of breath, 3L/[place]   Abdominal: Bowel sounds are normal. There is tenderness.  Obese, discoloration, skin ulcerations  Musculoskeletal:  Weakness, discoloration   Feet:  Right Foot:  Skin Integrity: Positive for skin breakdown and dry skin.  Left Foot:  Skin Integrity: Positive for skin breakdown and dry skin.  Neurological: She is alert and oriented to person, place, and time.  Skin: Skin is warm and dry. No bruising noted.  Multiple ulcerations to abdomen, abdomen folds, bilateral lower extremities, sacrum, lower leg discoloration  Psychiatric: She has a normal mood  and affect. Her speech is normal and behavior is normal. Judgment and thought content normal. Cognition and memory are normal.  Nursing note and vitals reviewed.   Vital Signs: BP (!) 166/82   Pulse 87   Temp 99.4 F (37.4 C) (Oral)   Resp (!) 24   Ht 5\' 4"  (1.626 m)   Wt (!) 231 kg   SpO2 94%   BMI 87.41 kg/m  Pain Scale: 0-10 POSS *See Group Information*: 1-Acceptable,Awake and alert Pain Score: 0-No pain   SpO2: SpO2: 94 % O2 Device:SpO2: 94 % O2 Flow Rate: .O2 Flow Rate (L/min): 3 L/min  IO: Intake/output summary:   Intake/Output Summary (Last 24 hours) at 01/12/2018 1455 Last data filed at 01/12/2018 1200 Gross per 24 hour  Intake 973.1 ml  Output 5650 ml  Net -4676.9 ml    LBM: Last BM Date: 01/11/18 Baseline Weight: Weight: (!) 235 kg Most recent weight: Weight: (!) 231 kg     Palliative Assessment/Data: PPS 30%   Time In: 1400 Time Out: 1515 Time Total: 75 min   Greater than 50%  of this time was spent counseling and coordinating care related to the above assessment and plan.  Signed by:  Alda Lea, AGPCNP-BC Palliative Medicine Team  Phone: [phone] Fax: [phone] Pager: [phone] Amion: Bjorn Pippin    Please contact Palliative Medicine Team phone at [phone] for questions and concerns.  For individual provider: See Shea Evans

## 2018-01-13 DIAGNOSIS — J9621 Acute and chronic respiratory failure with hypoxia: Secondary | ICD-10-CM

## 2018-01-13 DIAGNOSIS — Z66 Do not resuscitate: Secondary | ICD-10-CM

## 2018-01-13 DIAGNOSIS — I248 Other forms of acute ischemic heart disease: Secondary | ICD-10-CM

## 2018-01-13 DIAGNOSIS — K7200 Acute and subacute hepatic failure without coma: Secondary | ICD-10-CM

## 2018-01-13 DIAGNOSIS — Z515 Encounter for palliative care: Secondary | ICD-10-CM

## 2018-01-13 DIAGNOSIS — L03115 Cellulitis of right lower limb: Secondary | ICD-10-CM

## 2018-01-13 DIAGNOSIS — Z7189 Other specified counseling: Secondary | ICD-10-CM

## 2018-01-13 LAB — GLUCOSE, CAPILLARY
GLUCOSE-CAPILLARY: 119 mg/dL — AB (ref 70–99)
GLUCOSE-CAPILLARY: 96 mg/dL (ref 70–99)

## 2018-01-13 MED ORDER — BUDESONIDE 0.5 MG/2ML IN SUSP: 0.2500 mg | Freq: Two times a day (BID) | RESPIRATORY_TRACT | Status: DC

## 2018-01-13 MED ORDER — FUROSEMIDE 10 MG/ML IJ SOLN
20.0000 mg | Freq: Two times a day (BID) | INTRAMUSCULAR | Status: DC
  Administered 2018-01-13 – 2018-01-21 (×16): 20 mg via INTRAVENOUS
  Filled 2018-01-13 (×18): qty 2

## 2018-01-13 MED ORDER — IPRATROPIUM-ALBUTEROL 0.5-2.5 (3) MG/3ML IN SOLN
3.0000 mL | RESPIRATORY_TRACT | Status: DC | PRN
  Administered 2018-01-14: 3 mL via RESPIRATORY_TRACT
  Filled 2018-01-13: qty 3

## 2018-01-13 MED ORDER — METOPROLOL TARTRATE 25 MG PO TABS
25.0000 mg | ORAL_TABLET | Freq: Two times a day (BID) | ORAL | Status: DC
  Administered 2018-01-14 – 2018-01-21 (×14): 25 mg via ORAL
  Filled 2018-01-13 (×16): qty 1

## 2018-01-13 NOTE — Progress Notes (Signed)
Awake, alert, no overt distress.  No new complaints  Vitals:   01/12/18 2035 01/13/18 0027 01/13/18 0435 01/13/18 1250  BP: (!) 157/67 (!) 160/76 (!) 159/70 118/79  Pulse: 94 89 89 95  Resp:   18   Temp:   (!) 97.4 F (36.3 C) 98.9 F (37.2 C)  TempSrc:    Oral  SpO2: 97%  100% 95%  Weight:      Height:       Very obese, NAD HEENT WNL Neck: Unable to visualize JVP Chest: No wheezes anteriorly (but there is pseudo-wheeze transmitted transmitted from her upper airway) Cardiac: RRR, no M noted Abdomen: Very obese, nontender, distant BS Extremities: Severe symmetric brawny and pitting LE edema with chronic stasis changes Neuro: No focal deficits  BMP Latest Ref Rng & Units 01/12/2018 01/11/2018 01/10/2018  Glucose 70 - 99 mg/dL 263(H) 124(H) 171(H)  BUN 6 - 20 mg/dL 34(H) 42(H) 37(H)  Creatinine 0.44 - 1.00 mg/dL 0.67 0.80 0.75  Sodium 135 - 145 mmol/L 144 149(H) 148(H)  Potassium 3.5 - 5.1 mmol/L 4.1 4.4 4.5  Chloride 98 - 111 mmol/L 101 110 111  CO2 22 - 32 mmol/L 34(H) 34(H) 33(H)  Calcium 8.9 - 10.3 mg/dL 9.0 9.5 9.3   CBC Latest Ref Rng & Units 01/12/2018 01/11/2018 01/10/2018  WBC 4.0 - 10.5 K/uL 8.2 9.8 9.5  Hemoglobin 12.0 - 15.0 g/dL 12.5 12.5 12.5  Hematocrit 36.0 - 46.0 % 41.8 42.4 43.0  Platelets 150 - 400 K/uL 287 300 302    IMPRESSION: Morbid obesity 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   PLAN/REC: Continue supplemental oxygen.  Titrate carefully to maintain SPO2 88-94%.  Note that excess oxygenation could suppress ventilatory drive.  Therefore, encourage caution with supplemental oxygen  I have discontinued nebulized steroids and changed nebulized bronchodilators to as needed only  She must wear BiPAP with sleep whether napping or sleeping at night.  In the long-term,  she needs an aggressive strategy for weight loss  Exercise caution with loop diuretics.  In the setting of OHS, they tend to create severe metabolic alkalosis consequently suppressing ventilatory drive further  PCCM will sign off. Please call if we can be of further assistance    Merton Border, MD PCCM service Mobile [phone] Pager [phone] 01/13/2018 2:46 PM

## 2018-01-13 NOTE — Clinical Social Work Note (Signed)
Clinical Social Work Assessment  Patient Details  Name: Stephanie [name] MRN: [id number] Date of Birth: 09-13-1960  Date of referral:  01/13/18               Reason for consult:  Facility Placement                Permission sought to share information with:  Case Manager, Customer service manager, Family Supports Permission granted to share information::  Yes, Verbal Permission Granted  Name::      SNF  Agency::   [facility]   Relationship::     Contact Information:     Housing/Transportation Living arrangements for the past 2 months:  Single Family Home Source of Information:  Patient Patient Interpreter Needed:  None Criminal Activity/Legal Involvement Pertinent to Current Situation/Hospitalization:  No - Comment as needed Significant Relationships:  Parents, Siblings Lives with:  Parents Do you feel safe going back to the place where you live?  Yes Need for family participation in patient care:  Yes (Comment)  Care giving concerns:  Patient lives with her mother in [place]    Social Worker assessment / plan:  CSW consulted for SNF placement. CSW met with patient to discuss discharge plan. CSW introduced self and explained role. Patient reports that she lives with her mother. Patient also reports that she has a brother that checks on her and her mother during the week. 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 CSW explained PT recommendation of SNF. Patient is in agreement though she would prefer to go home. Patient states that currently she is unable to do anything but is hopeful that during hospital stay she will be able to get closer to her baseline. Patient agreed to SNF bed search. CSW will initiate bed search and give bed offers once received. CSW will continue to follow for discharge planning.   Employment status:  Therapist, music:  Managed Care PT Recommendations:  [place] / Referral to community resources:  [place]  Patient/Family's Response to care:  Patient thanked CSW for assistance   Patient/Family's Understanding of and Emotional Response to Diagnosis, Current Treatment, and Prognosis:  Patient understands current diagnoses and treatment plan   Emotional Assessment Appearance:  Appears stated age Attitude/Demeanor/Rapport:    Affect (typically observed):  Accepting, Pleasant, Hopeful Orientation:  Oriented to Self, Oriented to Place, Oriented to  Time, Oriented to Situation Alcohol / Substance use:  Not Applicable Psych involvement (Current and /or in the community):  No (Comment)  Discharge Needs  Concerns to be addressed:  Discharge Planning Concerns Readmission within the last 30 days:  No Current discharge risk:  Dependent with Mobility, Lack of support system Barriers to Discharge:  Continued Medical Work up   [employer], [place] 01/13/2018, 10:13 AM

## 2018-01-13 NOTE — NC FL2 (Signed)
[place] LEVEL OF CARE SCREENING TOOL     IDENTIFICATION  Patient Name: Stephanie [name] Birthdate: Feb 04, 1961 Sex: female Admission Date (Current Location): 01/01/2018  [place] and [place] Number:  Engineering geologist and Address:  [facility], [address], [place], [place] [address]      Provider Number: [id number]  Attending Physician Name and Address:  Vaughan Basta, *  Relative Name and Phone Number:  Gillermina Hu- mother   [phone]     Current Level of Care: Hospital Recommended Level of Care: [place] Prior Approval Number:    Date Approved/Denied:   PASRR Number: [id number]  Discharge Plan: SNF    Current Diagnoses: Patient Active Problem List   Diagnosis Date Noted  . Sepsis ([place]) 01/01/2018    Orientation RESPIRATION BLADDER Height & Weight     Self, Time, Situation, Place  O2(3 liters ) Continent Weight: (!) 509 lb 4.2 oz (231 kg) Height:  5\' 4"  (162.6 cm)  BEHAVIORAL SYMPTOMS/MOOD NEUROLOGICAL BOWEL NUTRITION STATUS  (none) (none) Incontinent Diet(Regular diet )  AMBULATORY STATUS COMMUNICATION OF NEEDS Skin   Extensive Assist Verbally Other (Comment)(Venus stasis ulcer right leg )                       Personal Care Assistance Level of Assistance  Bathing, Feeding, Dressing Bathing Assistance: Limited assistance Feeding assistance: Independent Dressing Assistance: Limited assistance     Functional Limitations Info  Sight, Hearing, Speech Sight Info: Adequate Hearing Info: Adequate Speech Info: Adequate    SPECIAL CARE FACTORS FREQUENCY  PT (By licensed PT), OT (By licensed OT)     PT Frequency: 5 OT Frequency: 5            Contractures Contractures Info: Not present    Additional Factors Info  Code Status, Allergies Code Status Info: DNR Allergies Info: NKA           Current Medications (01/13/2018):  This is the current hospital active  medication list Current Facility-Administered Medications  Medication Dose Route Frequency Provider Last Rate Last Dose  . 0.9 %  sodium chloride infusion   Intravenous PRN Hillary Bow, MD   Stopped at 01/07/18 1636  . albuterol (PROVENTIL) (2.5 MG/3ML) 0.083% nebulizer solution 2.5 mg  2.5 mg Nebulization Q2H PRN Hillary Bow, MD   2.5 mg at 01/11/18 1512  . amLODipine (NORVASC) tablet 10 mg  10 mg Oral Daily Conforti, John, DO   10 mg at 01/12/18 0850  . budesonide (PULMICORT) nebulizer solution 0.25 mg  0.25 mg Nebulization BID Wilhelmina Mcardle, MD      . chlorhexidine (PERIDEX) 0.12 % solution 15 mL  15 mL Mouth Rinse BID Tukov-Yual, Magdalene S, NP   15 mL at 01/12/18 2037  . dextrose 5 % solution   Intravenous Continuous Lahoma Rocker, MD 5 mL/hr at 01/12/18 1600    . enoxaparin (LOVENOX) injection 40 mg  40 mg Subcutaneous Q12H Hillary Bow, MD   40 mg at 01/12/18 2039  . feeding supplement (ENSURE ENLIVE) (ENSURE ENLIVE) liquid 237 mL  237 mL Oral TID BM Lahoma Rocker, MD   237 mL at 01/12/18 1413  . hydrALAZINE (APRESOLINE) injection 10 mg  10 mg Intravenous Q4H PRN Conforti, John, DO   10 mg at 01/12/18 1536  . ipratropium-albuterol (DUONEB) 0.5-2.5 (3) MG/3ML nebulizer solution 3 mL  3 mL Nebulization Q6H Wilhelmina Mcardle, MD   3 mL at 01/13/18 0718  .  lisinopril (PRINIVIL,ZESTRIL) tablet 20 mg  20 mg Oral Daily Lahoma Rocker, MD   20 mg at 01/12/18 1252  . MEDLINE mouth rinse  15 mL Mouth Rinse q12n4p Tukov-Yual, Magdalene S, NP   15 mL at 01/12/18 1253  . metoprolol tartrate (LOPRESSOR) tablet 25 mg  25 mg Oral BID Wilhelmina Mcardle, MD      . multivitamin with minerals tablet 1 tablet  1 tablet Oral Daily Charlett Nose, RPH   1 tablet at 01/12/18 0849  . polyethylene glycol (MIRALAX / GLYCOLAX) packet 17 g  17 g Oral Daily PRN Sudini, Srikar, MD      . sodium chloride flush (NS) 0.9 % injection 10-40 mL  10-40 mL Intracatheter Q12H Soyla Murphy, Maged, MD   10 mL at 01/12/18 2041  .  sodium chloride flush (NS) 0.9 % injection 10-40 mL  10-40 mL Intracatheter PRN Cassandria Santee, MD   10 mL at 01/12/18 1847  . tobramycin (TOBREX) 0.3 % ophthalmic solution 1 drop  1 drop Both Eyes Q4H Conforti, John, DO   1 drop at 01/13/18 0625     Discharge Medications: Please see discharge summary for a list of discharge medications.  Relevant Imaging Results:  Relevant Lab Results:   Additional Information SSN: [id number]  Annamaria Boots, [place]

## 2018-01-13 NOTE — Progress Notes (Signed)
SNF and Non-Emergent EMS Transport Benefits:  Number called: [id number] Rep: Chloe Reference Number: [id number]  [facility] Options PPO policy effective as of 05/31/17.  Benefit period runs 05/31/17 - 05/31/18.  $500 in-network deductible, of which $0 met so far.  In-network out of pocket is $6350, of which $0 met so far.     In-network SNF: After $500 deductible met, responsible for a 20% co-insurance until out of pocket met.  Limited to 30 days.  Per rep, auth not required.     Non-emergent EMS transport: After $500 deductible met, responsible for a 20% co-insurance until out of pocket met.  Auth not required for ground ambulance.

## 2018-01-13 NOTE — Plan of Care (Signed)
Discussed care planing and self care in detail. Pt appears eager to learn ways to facilitate recovery to baseline level. Pt stated she was walking prior to admission and wanted to know "what can I do to help make me get better". Discussed with pt breathing techniques and mobility. Discussed self care and contributing to self care to her current ability. Current POC is to transition care to skilled nursing facility. This was discussed with pt and with mother. All questions were addressed and no questions remain at current.

## 2018-01-13 NOTE — Consult Note (Signed)
Pharmacy Electrolyte Monitoring Consult:  Pharmacy consulted to assist in monitoring and replacing electrolytes in this 57 y.o. female admitted on 01/01/2018 with Sepsis. Patient has hypercapnic respiratory failure and lower extremity cellulitis.   Labs:  Sodium (mmol/L)  Date Value  01/12/2018 144   Potassium (mmol/L)  Date Value  01/12/2018 4.1   Magnesium (mg/dL)  Date Value  01/12/2018 2.0   Phosphorus (mg/dL)  Date Value  01/08/2018 3.9   Calcium (mg/dL)  Date Value  01/12/2018 9.0   Albumin (g/dL)  Date Value  01/12/2018 2.9 (L)    Assessment/Plan: Electrolytes: Will replace for goal potassium ~4, goal magnesium ~ 2, and goal phosphorus > 2.5.   Electrolytes within normal level. Electrolytes replacement not warranted now. Patient receiving continuous dextrose 5% solution.  Glucose:  Patient is not diabetic. BG for last 24 hours 106-263. 263 appears to be an outlier and does not represent patient's actual BG value.   Constipation:  Patient has scheduled ENSURE ENLIVE liquid 237 mL PO TID between meals scheduled.  Last recorded BM 11/14. Patient has PRN Miralax ordered.  Because this consult was initiated in CCU pharmacy will sign off for now   Vallery Sa, PharmD 01/13/2018 1:48 PM

## 2018-01-13 NOTE — Progress Notes (Signed)
Daily Progress Note   Patient Name: Stephanie [name]       Date: 01/13/2018 DOB: 01-15-1961  Age: 57 y.o. MRN#: [id number] Attending Physician: Vaughan Basta, * Primary Care Physician: Patient, No Pcp Per Admit Date: 01/01/2018  Reason for Consultation/Follow-up: Establishing goals of care  Subjective: Patient awake, A & O x3.  Denies pain.  Patient does complain of some shortness of breath with exertion. Tolerating CPAP at night and 3L/[place] during day. Velna Hatchet, RN and other staff at the bedside preparing to provide care to patient.  Assisted staff with bathing and wound care.  Patient has multiple skin breakdown to her abdomen, bilateral lower extremities, and sacrum.  Bathing and limiting change required maximum assist x6.  Patient tolerated well with some shortness of breath.  Aftercare was received discussed with patient regarding her feelings about her current condition and care.  Patient expressed concerns that she is aware she will not be able to return home and care for herself.  She expressed that she remains hopeful that she would show some signs of improvement by working continuously with physical therapy and focusing more on her health.  Patient is somewhat concerned about her mother and also her financial condition being she will be unable to work.  Support given.  Patient expressed wishes to seek placement at SNF due to the goal of care needed and awareness that her mother would not be able to assist her in the home.  We briefly reviewed our goals of care conversation on yesterday, patient expressed wishes to continue with outpatient palliative at facility and at home for support.  Length of Stay: 12  Current Medications: Scheduled Meds:  . amLODipine  10 mg Oral Daily  .  budesonide  0.25 mg Nebulization BID  . chlorhexidine  15 mL Mouth Rinse BID  . enoxaparin (LOVENOX) injection  40 mg Subcutaneous Q12H  . feeding supplement (ENSURE ENLIVE)  237 mL Oral TID BM  . ipratropium-albuterol  3 mL Nebulization Q6H  . lisinopril  20 mg Oral Daily  . mouth rinse  15 mL Mouth Rinse q12n4p  . metoprolol tartrate  25 mg Oral BID  . multivitamin with minerals  1 tablet Oral Daily  . sodium chloride flush  10-40 mL Intracatheter Q12H  . tobramycin  1 drop Both Eyes Q4H  Continuous Infusions: . sodium chloride Stopped (01/07/18 1636)  . dextrose 5 mL/hr at 01/12/18 1600    PRN Meds: sodium chloride, albuterol, hydrALAZINE, polyethylene glycol, sodium chloride flush  Physical Exam  Constitutional: She is oriented to person, place, and time. Vital signs are normal. She is cooperative. She appears ill.  Morbidly obese, chronically ill  Eyes:  Red sclera   Pulmonary/Chest: She has decreased breath sounds. She has wheezes.  Shortness of breath with exertion   Abdominal: Soft. Bowel sounds are normal.  Obese, skin breakdown  Musculoskeletal:  Generalized weakness, unable to lift or bend legs, c/o of heaviness, loiwer extremity cellulitis/lymphedema   Neurological: She is alert and oriented to person, place, and time.  Skin: Skin is warm and dry. Bruising noted.  Dry, discoloration, multiple skin breakdown to abdomen, inner thighs, lower extremity, sacrum   Psychiatric: Judgment normal. Cognition and memory are normal.  Nursing note and vitals reviewed.           Vital Signs: BP 118/79   Pulse 95   Temp 98.9 F (37.2 C) (Oral)   Resp 18   Ht 5\' 4"  (1.626 m)   Wt (!) 231 kg   SpO2 95%   BMI 87.41 kg/m  SpO2: SpO2: 95 % O2 Device: O2 Device: CPAP O2 Flow Rate: O2 Flow Rate (L/min): 3 L/min  Intake/output summary:   Intake/Output Summary (Last 24 hours) at 01/13/2018 1434 Last data filed at 01/13/2018 1253 Gross per 24 hour  Intake 39.77 ml    Output 1350 ml  Net -1310.23 ml   LBM: Last BM Date: 01/13/18 Baseline Weight: Weight: (!) 235 kg Most recent weight: Weight: (!) 231 kg       Palliative Assessment/Data:PPS 30%   Flowsheet Rows     Most Recent Value  Intake Tab  Referral Department  Critical care  Unit at Time of Referral  ICU  Palliative Care Primary Diagnosis  Sepsis/Infectious Disease  Date Notified  01/12/18  Palliative Care Type  New Palliative care  Reason for referral  Clarify Goals of Care  Date of Admission  01/01/18  Date first seen by Palliative Care  01/12/18  # of days Palliative referral response time  0 Day(s)  # of days IP prior to Palliative referral  11  Clinical Assessment  Psychosocial & Spiritual Assessment  Palliative Care Outcomes      Patient Active Problem List   Diagnosis Date Noted  . Sepsis ([place]) 01/01/2018    Palliative Care Assessment & Plan   Patient Profile: 57 y.o. female admitted on 01/01/2018 from home with complaints of fall due to extreme fatigue. She has a past medical history of hypertension, morbid obesity, and left leg ulceration (treated at wound clinic). Patient presented to ED after falling in the home and contacting EMS for assistance. Patient reported she initially fell and EMS came out to assist her up and left as she felt fine, however after getting up again attempting to walk she fell again and was short of breath. 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 During her ED course she was found to have multiple areas of skin breakdown, tachycardia, and hypoxic. Foley was placed with dark urine return. Chest X-ray showed no acute abnormalities. Right tib/fib x-ray showed no acute fractures. NA 148, K 5.4, Glucose 110, BUN 25, Cr 1.58, Albumin 2.7, AST 912, ALT 303, ABG ph7.16, pCO2 91, Bicarb 32.4, lactic acid 2.3, CK 27,818,  troponin 0.24, WBC 10.8. Since admission patient required intubation for hypercapnic  respiratory failure. She was successfully extubated on 11/8 to nasal cannula and nightly bipap support. Continues on cefepime and vancomycin  For lower extremity cellulitis, questionable lymphedema due to morbid obesity. She is being treated for hypertension and rhabdomyolysis. Palliative Medicine team consulted for goals of care discussion.   Recommendations/Plan:  DNR/DNI  Continue to treat the treatable  SNF for rehab pending placement/insurance verification with outpatient palliative  PMT will continue to follow and support.   Goals of Care and Additional Recommendations:  Limitations on Scope of Treatment: Full Scope Treatment and No Artificial Feeding  Code Status:    Code Status Orders  (From admission, onward)         Start     Ordered   01/01/18 2005  Do not attempt resuscitation (DNR)  Continuous    Question Answer Comment  In the event of cardiac or respiratory ARREST Do not call a "code blue"   In the event of cardiac or respiratory ARREST Do not perform Intubation, CPR, defibrillation or ACLS   In the event of cardiac or respiratory ARREST Use medication by any route, position, wound care, and other measures to relive pain and suffering. May use oxygen, suction and manual treatment of airway obstruction as needed for comfort.      01/01/18 2005        Code Status History    This patient has a current code status but no historical code status.      Prognosis:   Guarded to Poor   Discharge Planning:  [facility] for rehab with Palliative care service follow-up  Care plan was discussed with Patient, bedside RN, Dr. Anselm Jungling, and CSW.   Thank you for allowing the Palliative Medicine Team to assist in the care of this patient.   Total Time 50 min. Prolonged Time Billed NO       Greater than 50%  of this time was spent counseling and coordinating care related to the above assessment and plan.  Alda Lea,  AGPCNP-BC Palliative Medicine Team  Pager: [phone] Amion: Bjorn Pippin   Please contact Palliative Medicine Team phone at [phone] for questions and concerns.

## 2018-01-13 NOTE — Progress Notes (Signed)
[place] [facility] [place] NAME: Stephanie [name]    MR#:  [id number]  DATE OF BIRTH:  March 06, 1960  SUBJECTIVE:  On 4 ltr nasal canula BIPAP Q HS For a week, required multiple nurses to help to bedside commode.  REVIEW OF SYSTEMS:   Review of Systems  Constitutional: Negative for chills, fever and weight loss.  HENT: Negative for ear discharge, ear pain and nosebleeds.   Eyes: Negative for blurred vision, pain and discharge.  Respiratory: Negative for sputum production, shortness of breath, wheezing and stridor.   Cardiovascular: Negative for chest pain, palpitations, orthopnea and PND.  Gastrointestinal: Negative for abdominal pain, diarrhea, nausea and vomiting.  Genitourinary: Negative for frequency and urgency.  Musculoskeletal: Negative for back pain and joint pain.  Neurological: Negative for sensory change, speech change, focal weakness and weakness.  Psychiatric/Behavioral: Negative for depression and hallucinations. The patient is not nervous/anxious.    Tolerating Diet:regular Tolerating PT:on BIPAP  DRUG ALLERGIES:  No Known Allergies  VITALS:  Blood pressure 118/79, pulse 95, temperature 98.9 F (37.2 C), temperature source Oral, resp. rate 18, height 5\' 4"  (1.626 m), weight (!) 231 kg, SpO2 96 %.  PHYSICAL EXAMINATION:   Physical Exam  GENERAL:  57 y.o.-year-old patient lying in the bed with no acute distress. Morbidly obese EYES: Pupils equal, round, reactive to light and accommodation. No scleral icterus. Extraocular muscles intact.  HEENT: Head atraumatic, normocephalic. Oropharynx and nasopharynx clear. On nasal canula oxygen now.  NECK:  Supple, no jugular venous distention. No thyroid enlargement, no tenderness.  LUNGS: Normal breath sounds bilaterally, no wheezing, rales, rhonchi. No use of accessory muscles of respiration.  CARDIOVASCULAR: S1, S2 normal. No murmurs, rubs, or gallops.  ABDOMEN: Soft, nontender,  nondistended. Bowel sounds present. No organomegaly or mass.  EXTREMITIES: bilateral lower extremity lymphedema NEUROLOGIC: overall weak but no gross focal deficit PSYCHIATRIC: awakens on Stephanie [name] oxygen. SKIN: as above  LABORATORY PANEL:  CBC Recent Labs  Lab 01/12/18 0315  WBC 8.2  HGB 12.5  HCT 41.8  PLT 287    Chemistries  Recent Labs  Lab 01/12/18 0315  NA 144  K 4.1  CL 101  CO2 34*  GLUCOSE 263*  BUN 34*  CREATININE 0.67  CALCIUM 9.0  MG 2.0  AST 38  ALT 80*  ALKPHOS 39  BILITOT 1.0   Cardiac Enzymes No results for input(s): TROPONINI in the last 168 hours. RADIOLOGY:  Dg Chest Port 1 View  Result Date: 01/12/2018 CLINICAL DATA:  Shortness of breath EXAM: PORTABLE CHEST 1 VIEW COMPARISON:  01/11/2018 FINDINGS: Left PICC line remains in place, unchanged. Cardiomegaly with vascular congestion and probable early interstitial edema, worsening since prior study. No effusions or acute bony abnormality. IMPRESSION: Cardiomegaly, vascular congestion and probable early interstitial edema. Electronically Signed   By: Rolm Baptise M.D.   On: 01/12/2018 08:17   ASSESSMENT AND PLAN:  Stephanie [name]  is a 57 y.o. female with a known history of hypertension, morbid obesity presented to the emergency room due to extreme fatigue, falls.  Patient was found to have multiple areas of skin breakdown.  Tachycardia, hypoxic respiratory failure. She has had trouble with a chronic wound on her right leg which has gotten weaker over time.    * Sepsis secondary to right lower extremity cellulitis. likely has chronic edema secondary to massive obesity/chronic lymphedema. -was on broad-spectrum antibiotics with cefepime---now d/ced -Blood culture so far negative. -MRSA PCR positive  *Acute hypoxic respiratory  failure in the setting of sepsis with obesity hypoventilation syndrome -patient was falling asleep and not able to protect her areas. She got intubated and placed on the  ventilator--now extubated -BIPAP As needed -appreciate ICU attending input -high dose IV steroids--on HFNC--to [place] with prn BIPAP -discussed with ICU attending. Patient is a chronic CO2 retainer. She will benefit from noninvasive ventilation at home. Will ask care management to see if she can get it. -Spoke to home health agency provider also for the need of equipment at the time of discharge.  *Acute kidney injury secondary to sepsis and Rhabdomyolysis -IV fluids.   -creat better Elevated sodium  150--149--146- 144  *Hypertension.  Blood pressure in the normal range.  Hold medications.  *Elevated troponin due to rhabdomyolysis and demand ischemia No chest pain EKG shows no ST changes  *Acute rhabdomyolysis due to recurrent falls.  -CPK trending down  *Acute diastolic congestive heart failure We will give IV Lasix and keep monitoring renal function.  Case discussed with Care Management/Social Worker. Management plans discussed with the patient, family and they are in agreement.  CODE STATUS: full  DVT Prophylaxis: Lovenox  TOTAL TIME TAKING CARE OF THIS PATIENT: *30* minutes.  >50% time spent on counselling and coordination of care  Due to significant weakness, patient may need to have placement in a facility for rehab, as per social worker she may not get full payment for that and will have a lot of co-pay. We may end up her not wanting to go to rehab because of payment issue and how to work on arrangements at home but currently the challenge is because of her significant overweight she is noted unable to get out of the bed by herself.  POSSIBLE D/C IN ?? DAYS, DEPENDING ON CLINICAL CONDITION.  Note: This dictation was prepared with Dragon dictation along with smaller phrase technology. Any transcriptional errors that result from this process are unintentional.  Vaughan Basta M.D on 01/13/2018 at 4:04 PM  Between 7am to 6pm - Pager - [phone]  After 6pm go  to [email or web address] - password [email or web address]  [facility]  Office  [phone]  CC: Primary care physician; Patient, No Pcp PerPatient ID: Stephanie [name], female   DOB: 10/12/1960, 57 y.o.   MRN: [id number]

## 2018-01-13 NOTE — Progress Notes (Signed)
[place] [facility] [place] NAME: Stephanie [name]    MR#:  [id number]  DATE OF BIRTH:  12/04/1960  SUBJECTIVE:  On 4 ltr nasal canula BIPAP Q HS  REVIEW OF SYSTEMS:   Review of Systems  Constitutional: Negative for chills, fever and weight loss.  HENT: Negative for ear discharge, ear pain and nosebleeds.   Eyes: Negative for blurred vision, pain and discharge.  Respiratory: Negative for sputum production, shortness of breath, wheezing and stridor.   Cardiovascular: Negative for chest pain, palpitations, orthopnea and PND.  Gastrointestinal: Negative for abdominal pain, diarrhea, nausea and vomiting.  Genitourinary: Negative for frequency and urgency.  Musculoskeletal: Negative for back pain and joint pain.  Neurological: Negative for sensory change, speech change, focal weakness and weakness.  Psychiatric/Behavioral: Negative for depression and hallucinations. The patient is not nervous/anxious.    Tolerating Diet:regular Tolerating PT:on BIPAP  DRUG ALLERGIES:  No Known Allergies  VITALS:  Blood pressure (!) 159/70, pulse 89, temperature (!) 97.4 F (36.3 C), resp. rate 18, height 5\' 4"  (1.626 m), weight (!) 231 kg, SpO2 100 %.  PHYSICAL EXAMINATION:   Physical Exam  GENERAL:  57 y.o.-year-old patient lying in the bed with no acute distress. Morbidly obese EYES: Pupils equal, round, reactive to light and accommodation. No scleral icterus. Extraocular muscles intact.  HEENT: Head atraumatic, normocephalic. Oropharynx and nasopharynx clear. On nasal canula oxygen now.  NECK:  Supple, no jugular venous distention. No thyroid enlargement, no tenderness.  LUNGS: Normal breath sounds bilaterally, no wheezing, rales, rhonchi. No use of accessory muscles of respiration.  CARDIOVASCULAR: S1, S2 normal. No murmurs, rubs, or gallops.  ABDOMEN: Soft, nontender, nondistended. Bowel sounds present. No organomegaly or mass.  EXTREMITIES:  bilateral lower extremity lymphedema NEUROLOGIC: overall weak but no gross focal deficit PSYCHIATRIC: awakens on [place] oxygen. SKIN: as above  LABORATORY PANEL:  CBC Recent Labs  Lab 01/12/18 0315  WBC 8.2  HGB 12.5  HCT 41.8  PLT 287    Chemistries  Recent Labs  Lab 01/12/18 0315  NA 144  K 4.1  CL 101  CO2 34*  GLUCOSE 263*  BUN 34*  CREATININE 0.67  CALCIUM 9.0  MG 2.0  AST 38  ALT 80*  ALKPHOS 39  BILITOT 1.0   Cardiac Enzymes No results for input(s): TROPONINI in the last 168 hours. RADIOLOGY:  Dg Chest Port 1 View  Result Date: 01/12/2018 CLINICAL DATA:  Shortness of breath EXAM: PORTABLE CHEST 1 VIEW COMPARISON:  01/11/2018 FINDINGS: Left PICC line remains in place, unchanged. Cardiomegaly with vascular congestion and probable early interstitial edema, worsening since prior study. No effusions or acute bony abnormality. IMPRESSION: Cardiomegaly, vascular congestion and probable early interstitial edema. Electronically Signed   By: Rolm Baptise [name].   On: 01/12/2018 08:17   ASSESSMENT AND PLAN:  Stephanie [name]  is a 57 y.o. female with a known history of hypertension, morbid obesity presented to the emergency room due to extreme fatigue, falls.  Patient was found to have multiple areas of skin breakdown.  Tachycardia, hypoxic respiratory failure. She has had trouble with a chronic wound on her right leg which has gotten weaker over time.    * Sepsis secondary to right lower extremity cellulitis. likely has chronic edema secondary to massive obesity/chronic lymphedema. -was on broad-spectrum antibiotics with cefepime---now d/ced -Blood culture so far negative. -MRSA PCR positive  *Acute hypoxic respiratory failure in the setting of sepsis with obesity hypoventilation syndrome -patient was  falling asleep and not able to protect her areas. She got intubated and placed on the ventilator--now extubated -BIPAP As needed -appreciate ICU attending input -high dose  IV steroids--on HFNC--to [place] with prn BIPAP -discussed with ICU attending. Patient is a chronic CO2 retainer. She will benefit from noninvasive ventilation at home. Will ask care management to see if she can get it.  *Acute kidney injury secondary to sepsis and Rhabdomyolysis -IV fluids.   -creat better Elevated sodium  150--149--146- 144  *Hypertension.  Blood pressure in the normal range.  Hold medications.  *Elevated troponin due to rhabdomyolysis and demand ischemia No chest pain EKG shows no ST changes  *Acute rhabdomyolysis due to recurrent falls.  -CPK trending down   Case discussed with Care Management/Social Worker. Management plans discussed with the patient, family and they are in agreement.  CODE STATUS: full  DVT Prophylaxis: Lovenox  TOTAL TIME TAKING CARE OF THIS PATIENT: *30* minutes.  >50% time spent on counselling and coordination of care  POSSIBLE D/C IN ?? DAYS, DEPENDING ON CLINICAL CONDITION.  Note: This dictation was prepared with Dragon dictation along with smaller phrase technology. Any transcriptional errors that result from this process are unintentional.  Stephanie [name] on 01/13/2018 at 8:24 AM  Between 7am to 6pm - Pager - [phone]  After 6pm go to [email or web address] - password [email or web address]  [facility]  Office  [phone]  CC: Primary care physician; Patient, No Pcp PerPatient ID: Stephanie [name], female   DOB: 1960-09-01, 57 y.o.   MRN: [id number]

## 2018-01-13 NOTE — Evaluation (Signed)
Occupational Therapy Evaluation Patient Details Name: Stephanie [name] MRN: [id number] DOB: [birth year] Today's Date: 01/13/2018    History of Present Illness Pt is a 57 y.o. female presenting to hospital 01/01/18 with falls (pt found on floor), fatigue, and increasing weakness.  Pt noted with multiple areas of skin breakdown (full thickness L abdomen; partial thickness L anterior thigh, R anterior LE at bend in foot and R anterior thigh).  Pt admitted with sepsis secondary R LE cellulitis, AKI, htn, elevated troponin d/t rhabdomyolysis and demand ischemia, conjunctivitis, and acute hypoxic respiratory failure.  Pt intubated 01/02/18 and extubated 01/07/18.  Imaging R tib/fib showing no definitive fx's.  Per notes, pt CO2 retainer.  PMH includes htn, obesity, and chronic wound R LE.   Clinical Impression   Pt is 57 year old female who was found on the floor after a fall at home on 01-01-18 and with fatigue and weakness.  She also had sepsis due to R LE cellulitis, AKI, HTN, elevated troponin d/t rhabdomyolysis and demand ischemia, conjuctivitis and acute hypoxic respiratory failure.  She was intubated on 01-02-18 and extubated 01-07-18.  She is able to complete light grooming skills but with any movement of UEs she has increased WOB and is on 4L of O2 nasal cannula.  She required a Hoyer lift with PT for getting to EOB.  She is total assist for LB dressing and bathing skills due to SOB, decreased endurance for functional tasks and at risk for falls.  Pt would benefit from skilled OT services to increase independence in ADLs with ed and training in AD for LB dressing skills, education in energy conservation techniques, pursed lip breathing and recommendations for home modifications to increase safety and prevent falls. 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 Rec SNF after discharge.        Follow Up Recommendations  SNF    Equipment  Recommendations  Other (comment)(TBD)    Recommendations for Other Services       Precautions / Restrictions Precautions Precautions: Fall Precaution Comments: HOB >30 degrees; prevalon boot Restrictions Weight Bearing Restrictions: No      Mobility Bed Mobility                  Transfers                      Balance                                           ADL either performed or assessed with clinical judgement   ADL Overall ADL's : Needs assistance/impaired Eating/Feeding: Set up;Independent   Grooming: Wash/dry hands;Wash/dry face;Oral care;Applying deodorant;Brushing hair;Set up;Minimal assistance;Bed level Grooming Details (indicate cue type and reason): rest breaks needed due to SOB and labored breathing with fatigue         Upper Body Dressing : Set up;Minimal assistance   Lower Body Dressing: Total assistance Lower Body Dressing Details (indicate cue type and reason): pt is not able to reach feet for any LB dressing tasks due to obesity and declined any OOB tasks and currently needs a [employer] fast streamer.                General ADL Comments: Pt is very de-conditioned and weak with poor stamina for ADLs due to obesity and  on 4L of O2 nasal cannula with labored breathing.  educated in proper breathing techniques to prevent shallow chest breathing and discussed recommendations for AD for LB dressing when she is able to tolerate sitting up in chair or EOB.        Vision Patient Visual Report: No change from baseline       Perception     Praxis      Pertinent Vitals/Pain Pain Assessment: No/denies pain     Hand Dominance Right   Extremity/Trunk Assessment Upper Extremity Assessment Upper Extremity Assessment: RUE deficits/detail;LUE deficits/detail RUE Deficits / Details: at least 3/5 AROM shoulder flexion (to 90 degrees) and elbow flexion/extension; good hand grip strength.  pt is very de-conditioned and weak.  LUE  Deficits / Details: at least 3/5 AROM shoulder flexion (to 90 degrees) and elbow flexion/extension; good hand grip strength   Lower Extremity Assessment Lower Extremity Assessment: Defer to PT evaluation       Communication Communication Communication: No difficulties   Cognition Arousal/Alertness: Awake/alert Behavior During Therapy: WFL for tasks assessed/performed Overall Cognitive Status: Within Functional Limits for tasks assessed                                     General Comments       Exercises     Shoulder Instructions      Home Living Family/patient expects to be discharged to:: Private residence Living Arrangements: Parent Available Help at Discharge: Family Type of Home: House Home Access: Stairs to enter Technical brewer of Steps: 4 Entrance Stairs-Rails: Right;Left;Can reach both Home Layout: Multi-level Alternate Level Stairs-Number of Steps: flight down to basement living with L railing   Bathroom Shower/Tub: Teacher, early years/pre: Standard     Home Equipment: None          Prior Functioning/Environment Level of Independence: Independent        Comments: Pt working Biomedical scientist) at [employer]        OT Problem List: Decreased strength;Decreased activity tolerance;Decreased range of motion;Impaired balance (sitting and/or standing);Obesity;Decreased knowledge of use of DME or AE;Cardiopulmonary status limiting activity      OT Treatment/Interventions: Self-care/ADL training;Energy conservation;Balance training;Therapeutic activities;Patient/family education;DME and/or AE instruction    OT Goals(Current goals can be found in the care plan section) Acute Rehab OT Goals Patient Stated Goal: to do more for myself and lay on my side in bed again OT Goal Formulation: With patient Time For Goal Achievement: 01/27/18 Potential to Achieve Goals: Good ADL Goals Pt Will Perform Lower Body  Dressing: with set-up;with min assist;with adaptive equipment;sit to/from stand Pt Will Transfer to Toilet: with mod assist;with set-up;stand pivot transfer;bedside commode Pt Will Perform Toileting - Clothing Manipulation and hygiene: with supervision Pt/caregiver will Perform Home Exercise Program: With theraband;With written HEP provided  OT Frequency: Min 1X/week(rec increasing frequency of OT as she is able to get up to chair or sit EOB)   Barriers to D/C:            Co-evaluation              AM-PAC PT "6 Clicks" Daily Activity     Outcome Measure Help from another person eating meals?: None Help from another person taking care of personal grooming?: A Little Help from another person toileting, which includes using toliet, bedpan, or urinal?: Total Help from another person bathing (including washing, rinsing, drying)?:  Total Help from another person to put on and taking off regular upper body clothing?: A Little Help from another person to put on and taking off regular lower body clothing?: Total 6 Click Score: 13   End of Session    Activity Tolerance: Patient limited by fatigue Patient left: in bed;with call bell/phone within reach;with bed alarm set  OT Visit Diagnosis: Muscle weakness (generalized) (M62.81);History of falling (Z91.81)                Time: 4081-4481 OT Time Calculation (min): 35 min Charges:  OT General Charges $OT Visit: 1 Visit OT Evaluation $OT Eval Low Complexity: 1 Low OT Treatments $Self Care/Home Management : 8-22 mins  Chrys Racer, OTR/L ascom [phone] 01/13/18, 2:19 PM

## 2018-01-14 LAB — BASIC METABOLIC PANEL
ANION GAP: 7 (ref 5–15)
BUN: 23 mg/dL — ABNORMAL HIGH (ref 6–20)
CO2: 39 mmol/L — AB (ref 22–32)
Calcium: 8.8 mg/dL — ABNORMAL LOW (ref 8.9–10.3)
Chloride: 101 mmol/L (ref 98–111)
Creatinine, Ser: 0.61 mg/dL (ref 0.44–1.00)
GFR calc non Af Amer: >60 mL/min (ref >60)
GLUCOSE: 111 mg/dL — AB (ref 70–99)
POTASSIUM: 4.1 mmol/L (ref 3.5–5.1)
Sodium: 147 mmol/L — ABNORMAL HIGH (ref 135–145)

## 2018-01-14 NOTE — Progress Notes (Deleted)
SATURATION QUALIFICATIONS: (This note is used to comply with regulatory documentation for home oxygen)  Patient Saturations on Room Air at Rest =86%   Patient Saturations on Room Air while Ambulating =  Patient Saturations on Liters of oxygen while Ambulating =  Please briefly explain why patient needs home oxygen: pulmonary fibrosis

## 2018-01-14 NOTE — Progress Notes (Signed)
Please see MD note for daily exercise for  patient.

## 2018-01-14 NOTE — Progress Notes (Signed)
Wound care completed per order. Madlyn Frankel, RN

## 2018-01-14 NOTE — Progress Notes (Signed)
[place] at [place] NAME: Alvina Strother    MR#:  [id number]  DATE OF BIRTH:  [birth year]  SUBJECTIVE:  On 4 ltr nasal canula BIPAP Q HS For a week, required multiple nurses to help to bedside commode.  REVIEW OF SYSTEMS:   Review of Systems  Constitutional: Negative for chills, fever and weight loss.  HENT: Negative for ear discharge, ear pain and nosebleeds.   Eyes: Negative for blurred vision, pain and discharge.  Respiratory: Negative for sputum production, shortness of breath, wheezing and stridor.   Cardiovascular: Negative for chest pain, palpitations, orthopnea and PND.  Gastrointestinal: Negative for abdominal pain, diarrhea, nausea and vomiting.  Genitourinary: Negative for frequency and urgency.  Musculoskeletal: Negative for back pain and joint pain.  Neurological: Negative for sensory change, speech change, focal weakness and weakness.  Psychiatric/Behavioral: Negative for depression and hallucinations. The patient is not nervous/anxious.    Tolerating Diet:regular Tolerating PT:on BIPAP  DRUG ALLERGIES:  No Known Allergies  VITALS:  Blood pressure (!) 155/73, pulse 91, temperature 99.4 F (37.4 C), temperature source Oral, resp. rate 20, height 5\' 4"  (1.626 m), weight (!) 231 kg, SpO2 92 %.  PHYSICAL EXAMINATION:   Physical Exam  GENERAL:  57 y.o.-year-old patient lying in the bed with no acute distress. Morbidly obese EYES: Pupils equal, round, reactive to light and accommodation. No scleral icterus. Extraocular muscles intact.  HEENT: Head atraumatic, normocephalic. Oropharynx and nasopharynx clear. On nasal canula oxygen now.  NECK:  Supple, no jugular venous distention. No thyroid enlargement, no tenderness.  LUNGS: Normal breath sounds bilaterally, no wheezing, rales, rhonchi. No use of accessory muscles of respiration.  CARDIOVASCULAR: S1, S2 normal. No murmurs, rubs, or gallops.  ABDOMEN: Soft,  nontender, nondistended. Bowel sounds present. No organomegaly or mass.  EXTREMITIES: bilateral lower extremity lymphedema NEUROLOGIC: overall weak but no gross focal deficit PSYCHIATRIC: awakens on [place] oxygen. SKIN: as above  LABORATORY PANEL:  CBC Recent Labs  Lab 01/12/18 0315  WBC 8.2  HGB 12.5  HCT 41.8  PLT 287    Chemistries  Recent Labs  Lab 01/12/18 0315 01/14/18 0444  NA 144 147*  K 4.1 4.1  CL 101 101  CO2 34* 39*  GLUCOSE 263* 111*  BUN 34* 23*  CREATININE 0.67 0.61  CALCIUM 9.0 8.8*  MG 2.0  --   AST 38  --   ALT 80*  --   ALKPHOS 39  --   BILITOT 1.0  --    Cardiac Enzymes No results for input(s): TROPONINI in the last 168 hours. RADIOLOGY:  No results found. ASSESSMENT AND PLAN:  Jadae Steinke  is a 57 y.o. female with a known history of hypertension, morbid obesity presented to the emergency room due to extreme fatigue, falls.  Patient was found to have multiple areas of skin breakdown.  Tachycardia, hypoxic respiratory failure. She has had trouble with a chronic wound on her right leg which has gotten weaker over time.    * Sepsis secondary to right lower extremity cellulitis. likely has chronic edema secondary to massive obesity/chronic lymphedema. -was on broad-spectrum antibiotics with cefepime---now d/ced -Blood culture so far negative. -MRSA PCR positive  *Acute hypoxic respiratory failure in the setting of sepsis with obesity hypoventilation syndrome -patient was falling asleep and not able to protect her areas. She got intubated and placed on the ventilator--now extubated -BIPAP As needed -appreciate ICU attending input -high dose IV steroids--on HFNC--to [place] with prn BIPAP -  discussed with ICU attending. Patient is a chronic CO2 retainer. She will benefit from noninvasive ventilation at home. Will ask care management to see if she can get it. -Spoke to home health agency provider also for the need of equipment at the time of  discharge.  *Acute kidney injury secondary to sepsis and Rhabdomyolysis -IV fluids.   -creatinin better Elevated sodium  150--149--146- 144  *Hypertension.  Blood pressure in the normal range.  Hold medications.  *Elevated troponin due to rhabdomyolysis and demand ischemia No chest pain EKG shows no ST changes  *Acute rhabdomyolysis due to recurrent falls.  -CPK trending down  *Generalized weakness Patient could not get out of bed without help of multiple people. She was suggested to go to rehab by physical therapy. Social worker is working on [employer] but it looks to be challenging due to insurance and co-pay issues.  *Acute diastolic congestive heart failure We will give IV Lasix and keep monitoring renal function.  Case discussed with Care Management/Social Worker. Management plans discussed with the patient, family and they are in agreement.  CODE STATUS: full  DVT Prophylaxis: Lovenox  TOTAL TIME TAKING CARE OF THIS PATIENT: *32* minutes.  >50% time spent on counselling and coordination of care  Due to significant weakness, patient may need to have placement in a facility for rehab, as per social worker she may not get full payment for that and will have a lot of co-pay. We may end up her not wanting to go to rehab because of payment issue and how to work on arrangements at home but currently the challenge is because of her significant overweight she is noted unable to get out of the bed by herself.  POSSIBLE D/C IN ?? DAYS, DEPENDING ON CLINICAL CONDITION.  Note: This dictation was prepared with Dragon dictation along with smaller phrase technology. Any transcriptional errors that result from this process are unintentional.  Vaughan Basta M.D on 01/14/2018 at 3:03 PM  Between 7am to 6pm - Pager - [phone]  After 6pm go to [email or web address] - password [email or web address]  [facility]  Office  [phone]  CC: Primary care physician;  Patient, No Pcp PerPatient ID: Gabriellah Rabel, female   DOB: 04-06-1960, 57 y.o.   MRN: [id number]

## 2018-01-14 NOTE — Clinical Social Work Note (Signed)
CSW spoke with patient regarding insurance coverage. CSW explained that patient needs to meet deductible of $500 and will have a 20% co insurance for SNF placement. Patient reports that she would like to pursue placement. CSW explained that there are currently no bed offers and will extend bed search to [facility]. Patient in agreement. CSW will continue to follow for discharge planning.   [place], [place]

## 2018-01-15 ENCOUNTER — Inpatient Hospital Stay: Payer: BLUE CROSS/BLUE SHIELD

## 2018-01-15 LAB — BLOOD GAS, ARTERIAL
ACID-BASE EXCESS: 5.1 mmol/L — AB (ref 0.0–2.0)
Allens test (pass/fail): POSITIVE — AB
BICARBONATE: 31.8 mmol/L — AB (ref 20.0–28.0)
FIO2: 30
O2 SAT: 96.1 %
PATIENT TEMPERATURE: 37
PCO2 ART: 55 mmHg — AB (ref 32.0–48.0)
PEEP: 5 cmH2O
Pressure support: 10 cmH2O
pH, Arterial: 7.37 (ref 7.350–7.450)
pO2, Arterial: 85 mmHg (ref 83.0–108.0)

## 2018-01-15 MED ORDER — ALTEPLASE 2 MG IJ SOLR
2.0000 mg | Freq: Once | INTRAMUSCULAR | Status: AC
  Administered 2018-01-15: 2 mg
  Filled 2018-01-15: qty 2

## 2018-01-15 NOTE — Progress Notes (Signed)
[facility] [place] NAME: Stephanie [name]    MR#:  [id number]  DATE OF BIRTH:  January 28, 1961  SUBJECTIVE:  Rapid response last night for resp distress Patient on Bipap Denies SOB wants Bipap off  REVIEW OF SYSTEMS:    Review of Systems  Constitutional: Negative for fever, chills weight loss HENT: Negative for ear pain, nosebleeds, congestion, facial swelling, rhinorrhea, neck pain, neck stiffness and ear discharge.   Respiratory: Negative for cough, shortness of breath, wheezing  Cardiovascular: Negative for chest pain, palpitations and leg swelling.  Gastrointestinal: Negative for heartburn, abdominal pain, vomiting, diarrhea or consitpation Genitourinary: Negative for dysuria, urgency, frequency, hematuria Musculoskeletal: Negative for back pain or joint pain Neurological: Negative for dizziness, seizures, syncope, focal weakness,  numbness and headaches.  Hematological: Does not bruise/bleed easily.  Psychiatric/Behavioral: Negative for hallucinations, confusion, dysphoric mood    Tolerating Diet: yes      DRUG ALLERGIES:  No Known Allergies  VITALS:  Blood pressure 135/70, pulse 87, temperature 99.5 F (37.5 C), temperature source Oral, resp. rate (!) 23, height 5\' 4"  (1.626 m), weight (!) 231 kg, SpO2 90 %.  PHYSICAL EXAMINATION:  Constitutional: Morbidly obese no distress. HENT: Normocephalic. .Eyes: Conjunctivae and EOM are normal. PERRLA, no scleral icterus.  Neck: Normal ROM. Neck supple. No JVD. No tracheal deviation. CVS: RRR, S1/S2 +, no murmurs, no gallops, no carotid bruit.  Pulmonary: Effort and breath sounds normal, no stridor, rhonchi, wheezes, rales.  Abdominal: Soft. BS +,  no distension, tenderness, rebound or guarding.  Musculoskeletal: Normal range of motion.  3+ lower extremity edema and no tenderness.  Neuro: Alert. CN 2-12 grossly intact. No focal deficits. Skin: Chronic wound right leg psychiatric:  Normal mood and affect.      LABORATORY PANEL:   CBC Recent Labs  Lab 01/12/18 0315  WBC 8.2  HGB 12.5  HCT 41.8  PLT 287   ------------------------------------------------------------------------------------------------------------------  Chemistries  Recent Labs  Lab 01/12/18 0315 01/14/18 0444  NA 144 147*  K 4.1 4.1  CL 101 101  CO2 34* 39*  GLUCOSE 263* 111*  BUN 34* 23*  CREATININE 0.67 0.61  CALCIUM 9.0 8.8*  MG 2.0  --   AST 38  --   ALT 80*  --   ALKPHOS 39  --   BILITOT 1.0  --    ------------------------------------------------------------------------------------------------------------------  Cardiac Enzymes No results for input(s): TROPONINI in the last 168 hours. ------------------------------------------------------------------------------------------------------------------  RADIOLOGY:  Dg Chest 1 View  Result Date: 01/15/2018 CLINICAL DATA:  Hypoxia. EXAM: CHEST  1 VIEW COMPARISON:  01/12/2018 FINDINGS: Left PICC is unchanged. The cardiac silhouette remains enlarged. Aortic atherosclerosis is noted. There is persistent pulmonary vascular congestion. The interstitial markings are slightly less prominent than on the prior study without overt edema. Streaky opacity in both lung bases is new or increased. A trace right pleural effusion is questioned. No pneumothorax is identified. IMPRESSION: 1. Cardiomegaly and pulmonary vascular congestion without overt edema. 2. Bibasilar opacities, likely atelectasis. Electronically Signed   By: Logan Bores M.D.   On: 01/15/2018 09:36     ASSESSMENT AND PLAN:   57 year old female with morbid obesity who presented to ER due to fatigue and falls.   1.  Sepsis due to right lower extremity cellulitis with chronic lymphedema: Patient was on broad-spectrum antibiotics and has been treated for cellulitis.   2.  Acute hypoxic respiratory failure in the setting of obesity hypoventilation syndrome: Patient has been  extubated and  now on nasal cannula/BiPAP Patient will need to continue on BiPAP at night She would benefit from noninvasive ventilation at home  3.  Acute kidney injury due to sepsis and rhabdomyolysis: Creatinine has improved  4.  Essential hypertension: Continue metoprolol Norvasc and lisinopril  5.  Elevated troponin due to demand ischemia.  Patient ruled out for ACS  6.  Morbid obesity: Encouraged weight loss as tolerated  7.  Acute diastolic heart failure with preserved ejection fraction and grade 2 diastolic dysfunction: Continue Lasix Monitor intake and output with daily weight Still 1.8 l positive    Management plans discussed with the patient and she is in agreement.  CODE STATUS: dnr  TOTAL TIME TAKING CARE OF THIS PATIENT: 28 minutes.     POSSIBLE D/C Monday to snf, DEPENDING ON CLINICAL CONDITION.   Shawanda Sievert M.D on 01/15/2018 at 11:03 AM  Between 7am to 6pm - Pager - [phone] After 6pm go to [email or web address] - password [email or web address] [facility]  Office  [phone]  CC: Primary care physician; Patient, No Pcp Per  Note: This dictation was prepared with Dragon dictation along with smaller phrase technology. Any transcriptional errors that result from this process are unintentional.

## 2018-01-15 NOTE — Progress Notes (Signed)
   01/15/18 1900  Clinical Encounter Type  Visited With Patient  Visit Type Follow-up  Referral From Chaplain  Spiritual Encounters  Spiritual Needs Prayer

## 2018-01-15 NOTE — Plan of Care (Signed)
  Problem: Fluid Volume: Goal: Hemodynamic stability will improve Outcome: Progressing   Problem: Clinical Measurements: Goal: Diagnostic test results will improve Outcome: Progressing   Problem: Respiratory: Goal: Ability to maintain adequate ventilation will improve Outcome: Progressing   Problem: Fluid Volume: Goal: Hemodynamic stability will improve Outcome: Progressing   Problem: Clinical Measurements: Goal: Diagnostic test results will improve Outcome: Progressing Goal: Signs and symptoms of infection will decrease Outcome: Progressing   Problem: Respiratory: Goal: Ability to maintain adequate ventilation will improve Outcome: Progressing   Problem: Health Behavior/Discharge Planning: Goal: Ability to manage health-related needs will improve Outcome: Progressing   Problem: Clinical Measurements: Goal: Ability to maintain clinical measurements within normal limits will improve Outcome: Progressing Goal: Diagnostic test results will improve Outcome: Progressing Goal: Respiratory complications will improve Outcome: Progressing Goal: Cardiovascular complication will be avoided Outcome: Progressing   Problem: Activity: Goal: Risk for activity intolerance will decrease Outcome: Progressing   Problem: Nutrition: Goal: Adequate nutrition will be maintained Outcome: Progressing   Problem: Coping: Goal: Level of anxiety will decrease Outcome: Progressing   Problem: Elimination: Goal: Will not experience complications related to bowel motility Outcome: Progressing   Problem: Pain Managment: Goal: General experience of comfort will improve Outcome: Progressing   Problem: Safety: Goal: Ability to remain free from injury will improve Outcome: Progressing   Problem: Skin Integrity: Goal: Risk for impaired skin integrity will decrease Outcome: Progressing

## 2018-01-15 NOTE — Progress Notes (Addendum)
Physical Therapy Treatment Patient Details Name: Stephanie [name] MRN: [id number] DOB: 03/23/1960 Today's Date: 01/15/2018    History of Present Illness Pt is a 57 y.o. female presenting to hospital 01/01/18 with falls (pt found on floor), fatigue, and increasing weakness.  Pt noted with multiple areas of skin breakdown (full thickness L abdomen; partial thickness L anterior thigh, R anterior LE at bend in foot and R anterior thigh).  Pt admitted with sepsis secondary R LE cellulitis, AKI, htn, elevated troponin d/t rhabdomyolysis and demand ischemia, conjunctivitis, and acute hypoxic respiratory failure.  Pt intubated 01/02/18 and extubated 01/07/18.  Imaging R tib/fib showing no definitive fx's.  Per notes, pt CO2 retainer.  PMH includes htn, obesity, and chronic wound R LE.    PT Comments     Chart reviewed and discussed with RN prior to session. Rapid response called earlier this am.  RN ok'ed session if pt wanted wanted to participate.  She agreed and was motivated despite overall appearance of fatigue and eyes bloodshot from crying.  Session limited to supine exercises as below and pt education for HEP.  HR and O2 stable during session.     Follow Up Recommendations  SNF     Equipment Recommendations       Recommendations for Other Services       Precautions / Restrictions Precautions Precautions: Fall Precaution Comments: HOB >30 degrees; prevalon boot Restrictions Weight Bearing Restrictions: No    Mobility  Bed Mobility               General bed mobility comments: deferred  Transfers                    Ambulation/Gait                 Stairs             Wheelchair Mobility    Modified Rankin (Stroke Patients Only)       Balance                                            Cognition Arousal/Alertness: Awake/alert Behavior During Therapy: WFL for tasks assessed/performed Overall Cognitive Status: Within Functional  Limits for tasks assessed                                 General Comments: eyes red and teary from crying.      Exercises Other Exercises Other Exercises: ankle pumps, quad and glut sets, ab/add, SLR x 10 with rest as needed.    General Comments        Pertinent Vitals/Pain Pain Assessment: No/denies pain    Home Living                      Prior Function            PT Goals (current goals can now be found in the care plan section) Progress towards PT goals: Progressing toward goals    Frequency    Min 2X/week      PT Plan Current plan remains appropriate    Co-evaluation              AM-PAC PT "6 Clicks" Daily Activity  Outcome Measure  Difficulty turning over in bed (including adjusting bedclothes, sheets and  blankets)?: Unable Difficulty moving from lying on back to sitting on the side of the bed? : Unable Difficulty sitting down on and standing up from a chair with arms (e.g., wheelchair, bedside commode, etc,.)?: Unable Help needed moving to and from a bed to chair (including a wheelchair)?: Total Help needed walking in hospital room?: Total Help needed climbing 3-5 steps with a railing? : Total 6 Click Score: 6    End of Session Equipment Utilized During Treatment: Oxygen Activity Tolerance: Patient limited by fatigue           Time: 3736-6815 PT Time Calculation (min) (ACUTE ONLY): 19 min  Charges:  $Therapeutic Exercise: 8-22 mins                     Chesley Noon, PTA 01/15/18, 12:38 PM

## 2018-01-15 NOTE — Progress Notes (Signed)
Telemetry called to advise that the pt had a 6 beat run of V Tach; paged Dr Benjie Karvonen

## 2018-01-15 NOTE — Progress Notes (Signed)
RT to patient bedside for Rapid Response. Patient placed back on Bipap to assist with Respiratory distress. SPO2 66. FIO2 increased from 35 to 45 %, SAT at 89% per O2 orders 88-94% is okay for patient. RN aware. Will continue to monitor.

## 2018-01-15 NOTE — Progress Notes (Signed)
While being bathed, patient became unresponsive with a weak pulse and dypsnea. A rapid response, and then a code was called. Patient is a DNR, therefore breathing was assisted with a bag valve mask, but no CPR was performed. Patient was put on continuous BiPAP per MD and will continue to monitor.

## 2018-01-15 NOTE — Progress Notes (Signed)
It was observed at shift change hand-off report that patient is more alert, oriented, and responsive. Patient continues to be on BiPAP at this time. Will continue to monitor.

## 2018-01-16 LAB — GLUCOSE, CAPILLARY: GLUCOSE-CAPILLARY: 85 mg/dL (ref 70–99)

## 2018-01-16 NOTE — Plan of Care (Signed)
  Problem: Fluid Volume: Goal: Hemodynamic stability will improve Outcome: Progressing   Problem: Clinical Measurements: Goal: Diagnostic test results will improve Outcome: Progressing   Problem: Respiratory: Goal: Ability to maintain adequate ventilation will improve Outcome: Progressing   Problem: Fluid Volume: Goal: Hemodynamic stability will improve Outcome: Progressing   Problem: Clinical Measurements: Goal: Diagnostic test results will improve Outcome: Progressing Goal: Signs and symptoms of infection will decrease Outcome: Progressing   Problem: Respiratory: Goal: Ability to maintain adequate ventilation will improve Outcome: Progressing   Problem: Clinical Measurements: Goal: Ability to maintain clinical measurements within normal limits will improve Outcome: Progressing Goal: Diagnostic test results will improve Outcome: Progressing Goal: Respiratory complications will improve Outcome: Progressing Goal: Cardiovascular complication will be avoided Outcome: Progressing   Problem: Nutrition: Goal: Adequate nutrition will be maintained Outcome: Progressing   Problem: Coping: Goal: Level of anxiety will decrease Outcome: Progressing   Problem: Elimination: Goal: Will not experience complications related to bowel motility Outcome: Progressing   Problem: Pain Managment: Goal: General experience of comfort will improve Outcome: Progressing   Problem: Safety: Goal: Ability to remain free from injury will improve Outcome: Progressing

## 2018-01-16 NOTE — Progress Notes (Signed)
[facility] [place] NAME: Stephanie [name]    MR#:  [id number]  DATE OF BIRTH:  1960/12/10  SUBJECTIVE:  No acute issues overnight.  Patient feeling generalized weakness  REVIEW OF SYSTEMS:    Review of Systems  Constitutional: Negative for fever, chills weight loss HENT: Negative for ear pain, nosebleeds, congestion, facial swelling, rhinorrhea, neck pain, neck stiffness and ear discharge.   Respiratory: Negative for cough, shortness of breath, wheezing  Cardiovascular: Negative for chest pain, palpitations and leg swelling.  Gastrointestinal: Negative for heartburn, abdominal pain, vomiting, diarrhea or consitpation Genitourinary: Negative for dysuria, urgency, frequency, hematuria Musculoskeletal: Negative for back pain or joint pain Neurological: Negative for dizziness, seizures, syncope, focal weakness,  numbness and headaches.  Hematological: Does not bruise/bleed easily.  Psychiatric/Behavioral: Negative for hallucinations, confusion, dysphoric mood    Tolerating Diet: yes      DRUG ALLERGIES:  No Known Allergies  VITALS:  Blood pressure (!) 155/58, pulse 79, temperature 97.9 F (36.6 C), resp. rate 20, height 5\' 4"  (1.626 m), weight (!) 231 kg, SpO2 97 %.  PHYSICAL EXAMINATION:  Constitutional: Morbidly obese no distress. HENT: Normocephalic. .Eyes: Conjunctivae and EOM are normal. PERRLA, no scleral icterus.  Neck: Normal ROM. Neck supple. No JVD. No tracheal deviation. CVS: RRR, S1/S2 +, no murmurs, no gallops, no carotid bruit.  Pulmonary: Effort and breath sounds normal, no stridor, rhonchi, wheezes, rales.  Abdominal: Soft. BS +,  no distension, tenderness, rebound or guarding.  Musculoskeletal: Normal range of motion.  3+ lower extremity edema and no tenderness.  Neuro: Alert. CN 2-12 grossly intact. No focal deficits. Skin: Chronic wound right leg  psychiatric: Normal mood and affect.      LABORATORY PANEL:    CBC Recent Labs  Lab 01/12/18 0315  WBC 8.2  HGB 12.5  HCT 41.8  PLT 287   ------------------------------------------------------------------------------------------------------------------  Chemistries  Recent Labs  Lab 01/12/18 0315 01/14/18 0444  NA 144 147*  K 4.1 4.1  CL 101 101  CO2 34* 39*  GLUCOSE 263* 111*  BUN 34* 23*  CREATININE 0.67 0.61  CALCIUM 9.0 8.8*  MG 2.0  --   AST 38  --   ALT 80*  --   ALKPHOS 39  --   BILITOT 1.0  --    ------------------------------------------------------------------------------------------------------------------  Cardiac Enzymes No results for input(s): TROPONINI in the last 168 hours. ------------------------------------------------------------------------------------------------------------------  RADIOLOGY:  Dg Chest 1 View  Result Date: 01/15/2018 CLINICAL DATA:  Hypoxia. EXAM: CHEST  1 VIEW COMPARISON:  01/12/2018 FINDINGS: Left PICC is unchanged. The cardiac silhouette remains enlarged. Aortic atherosclerosis is noted. There is persistent pulmonary vascular congestion. The interstitial markings are slightly less prominent than on the prior study without overt edema. Streaky opacity in both lung bases is new or increased. A trace right pleural effusion is questioned. No pneumothorax is identified. IMPRESSION: 1. Cardiomegaly and pulmonary vascular congestion without overt edema. 2. Bibasilar opacities, likely atelectasis. Electronically Signed   By: Logan Bores M.D.   On: 01/15/2018 09:36     ASSESSMENT AND PLAN:   57 year old female with morbid obesity who presented to ER due to fatigue and falls.   1.  Sepsis due to right lower extremity cellulitis with chronic lymphedema: Patient was on broad-spectrum antibiotics and has completed treatment for cellulitis.  2.  Acute hypoxic respiratory failure in the setting of obesity hypoventilation syndrome: Patient has been extubated and now on nasal cannula/BiPAP at  night. Patient will need  to continue on BiPAP at night as per pulmonologist. She would benefit from noninvasive ventilation at home as per pulmonologist.  3.  Acute kidney injury due to sepsis and rhabdomyolysis: Creatinine has improved  4.  Essential hypertension: Continue metoprolol, Norvasc and lisinopril  5.  Elevated troponin due to demand ischemia.  Patient has ruled out for ACS  6.  Morbid obesity: Encouraged weight loss as tolerated  7.  Acute diastolic heart failure with preserved ejection fraction and grade 2 diastolic dysfunction: Continue Lasix Monitor intake and output with dsodaily weight -1.2 L today BMP for am  Na elevated today so likely closely to euvolemia. Management plans discussed with the patient and she is in agreement.  CODE STATUS: dnr  TOTAL TIME TAKING CARE OF THIS PATIENT: 24 minutes.     POSSIBLE D/C Monday to snf, DEPENDING ON CLINICAL CONDITION.   Joann Kulpa M.D on 01/16/2018 at 10:49 AM  Between 7am to 6pm - Pager - [phone] After 6pm go to [email or web address] - password [email or web address] [facility]  Office  [phone]  CC: Primary care physician; Patient, No Pcp Per  Note: This dictation was prepared with Dragon dictation along with smaller phrase technology. Any transcriptional errors that result from this process are unintentional.

## 2018-01-17 ENCOUNTER — Inpatient Hospital Stay: Payer: BLUE CROSS/BLUE SHIELD

## 2018-01-17 LAB — URINALYSIS, ROUTINE W REFLEX MICROSCOPIC
Bilirubin Urine: NEGATIVE
Glucose, UA: NEGATIVE mg/dL
Ketones, ur: NEGATIVE mg/dL
Leukocytes, UA: NEGATIVE
Nitrite: POSITIVE — AB
Protein, ur: NEGATIVE mg/dL
SPECIFIC GRAVITY, URINE: 1.012 (ref 1.005–1.030)
pH: 6 (ref 5.0–8.0)

## 2018-01-17 LAB — BASIC METABOLIC PANEL
Anion gap: 9 (ref 5–15)
BUN: 24 mg/dL — ABNORMAL HIGH (ref 6–20)
CALCIUM: 8.8 mg/dL — AB (ref 8.9–10.3)
CO2: 44 mmol/L — AB (ref 22–32)
CREATININE: 0.66 mg/dL (ref 0.44–1.00)
Chloride: 89 mmol/L — ABNORMAL LOW (ref 98–111)
GLUCOSE: 90 mg/dL (ref 70–99)
Potassium: 4.1 mmol/L (ref 3.5–5.1)
Sodium: 142 mmol/L (ref 135–145)

## 2018-01-17 MED ORDER — POLYETHYLENE GLYCOL 3350 17 G PO PACK: 17.0000 g | PACK | Freq: Every day | ORAL | 0 refills | Status: AC | PRN

## 2018-01-17 MED ORDER — FUROSEMIDE 40 MG PO TABS: 40.0000 mg | ORAL_TABLET | Freq: Two times a day (BID) | ORAL | 0 refills | Status: DC

## 2018-01-17 MED ORDER — AMLODIPINE BESYLATE 10 MG PO TABS: 10.0000 mg | ORAL_TABLET | Freq: Every day | ORAL | Status: DC

## 2018-01-17 MED ORDER — METOPROLOL TARTRATE 25 MG PO TABS: 25.0000 mg | ORAL_TABLET | Freq: Two times a day (BID) | ORAL | Status: AC

## 2018-01-17 MED ORDER — IPRATROPIUM-ALBUTEROL 0.5-2.5 (3) MG/3ML IN SOLN: 3.0000 mL | RESPIRATORY_TRACT | Status: DC | PRN

## 2018-01-17 MED ORDER — LEVOFLOXACIN 750 MG PO TABS: 750.0000 mg | ORAL_TABLET | Freq: Every day | ORAL | 0 refills | Status: DC

## 2018-01-17 MED ORDER — ENSURE ENLIVE PO LIQD: 237.0000 mL | Freq: Three times a day (TID) | ORAL | 12 refills | Status: DC

## 2018-01-17 MED ORDER — LEVOFLOXACIN 750 MG PO TABS
750.0000 mg | ORAL_TABLET | Freq: Every day | ORAL | Status: DC
  Administered 2018-01-17 – 2018-01-19 (×3): 750 mg via ORAL
  Filled 2018-01-17 (×3): qty 1

## 2018-01-17 MED ORDER — LISINOPRIL 20 MG PO TABS: 20.0000 mg | ORAL_TABLET | Freq: Every day | ORAL | Status: DC

## 2018-01-17 NOTE — Plan of Care (Signed)
  Problem: Fluid Volume: Goal: Hemodynamic stability will improve Outcome: Progressing   Problem: Clinical Measurements: Goal: Diagnostic test results will improve Outcome: Progressing   Problem: Respiratory: Goal: Ability to maintain adequate ventilation will improve Outcome: Progressing   Problem: Fluid Volume: Goal: Hemodynamic stability will improve Outcome: Progressing   Problem: Clinical Measurements: Goal: Diagnostic test results will improve Outcome: Progressing Goal: Signs and symptoms of infection will decrease Outcome: Progressing   Problem: Respiratory: Goal: Ability to maintain adequate ventilation will improve Outcome: Progressing   Problem: Clinical Measurements: Goal: Ability to maintain clinical measurements within normal limits will improve Outcome: Progressing Goal: Diagnostic test results will improve Outcome: Progressing Goal: Respiratory complications will improve Outcome: Progressing Goal: Cardiovascular complication will be avoided Outcome: Progressing   Problem: Nutrition: Goal: Adequate nutrition will be maintained Outcome: Progressing   Problem: Coping: Goal: Level of anxiety will decrease Outcome: Progressing   Problem: Elimination: Goal: Will not experience complications related to bowel motility Outcome: Progressing   Problem: Pain Managment: Goal: General experience of comfort will improve Outcome: Progressing   Problem: Safety: Goal: Ability to remain free from injury will improve Outcome: Progressing

## 2018-01-17 NOTE — Clinical Social Work Note (Signed)
CSW met with patient to discuss bed offers. Patient asked that CSW speak with patient's brother Ahri Olson [phone]. Corene Cornea chose [facility] for patient. CSW spoke with Chantel, admissions coordinator at [facility] and informed her of bed acceptance and discharge orders for today. Chantel states that she needs to check on insurance and call CSW back. CSW awaiting call back from Chantel.   [place], [place]

## 2018-01-17 NOTE — Discharge Summary (Signed)
[facility] [facility] [place] NAME: Stephanie [name]    MR#:  [id number]  DATE OF BIRTH:  1961/01/31  DATE OF ADMISSION:  01/01/2018 ADMITTING PHYSICIAN: Hillary Bow, MD  DATE OF DISCHARGE: 01/17/2018  PRIMARY CARE PHYSICIAN: Patient, No Pcp Per    ADMISSION DIAGNOSIS:  Fall [W19.XXXA] Demand ischemia ([place]) [I24.8] Shock liver [K72.00] Cellulitis of right lower extremity [J82.505] Non-traumatic rhabdomyolysis [M62.82] Sepsis ([place]) [A41.9]  DISCHARGE DIAGNOSIS:  Active Problems:   Sepsis ([place])   SECONDARY DIAGNOSIS:   Past Medical History:  Diagnosis Date  . Hypertension   . Obesity     HOSPITAL COURSE:   57 year old female with morbid obesity who presented to ER due to fatigue and falls.   1.  Sepsis due to right lower extremity cellulitis with chronic lymphedema: Patient was on broad-spectrum antibiotics and has completed treatment for cellulitis.  2.  Acute hypoxic respiratory failure in the setting of obesity hypoventilation syndrome: Patient has been extubated and now on nasal cannula/BiPAP at night. Patient will need to continue on BiPAP/CPAP at night as per pulmonologist. She would benefit from noninvasive ventilation at home as per pulmonologist. She will have outpatient follow-up with pulmonary.   3.  Acute kidney injury due to sepsis and rhabdomyolysis: Creatinine has improved  4.  Essential hypertension: She will need to continue metoprolol, Norvasc and lisinopril  5.  Elevated troponin due to demand ischemia.  Patient has ruled out for ACS  6.  Morbid obesity: Encouraged weight loss as tolerated  7.  Acute diastolic heart failure with preserved ejection fraction and grade 2 diastolic dysfunction:  Patient has diuresed.  She will need oral Lasix. She is referred to CHF clinic.  She will have outpatient follow-up with Dr. Rockey Situ. She will need to continue monitoring weight daily.  8.  Pneumonia: Patient had  low-grade fever which is likely due to atelectasis however due to her body habitus and poor functional status I will prescribe Levaquin for 5 days.  Stop date of Levaquin should be 01/22/2018.  She will need incentive spirometer at bedside.  Wound care  House antimicrobial textile (InterDry Ag+) is provided for intertriginous dermatitis. Topical care orders for calcium alginate dressing, topped with dry gauze and covered with ABD pad and secured with paper tape are provided for the left abdomen and left thigh. A sacral prophylactic dressing is ordered for the intact, but at risk sacral area.  She would benefit from outpatient palliative care services.  DISCHARGE CONDITIONS AND DIET:   stAble for discharge on heart healthy diet  CONSULTS OBTAINED:    DRUG ALLERGIES:  No Known Allergies  DISCHARGE MEDICATIONS:   Allergies as of 01/17/2018   No Known Allergies     Medication List    TAKE these medications   acetaminophen 500 MG tablet Commonly known as:  TYLENOL Take 500-1,000 mg by mouth every 6 (six) hours as needed for headache or pain.   amLODipine 10 MG tablet Commonly known as:  NORVASC Take 1 tablet (10 mg total) by mouth daily. Start taking on:  01/18/2018   feeding supplement (ENSURE ENLIVE) Liqd Take 237 mLs by mouth 3 (three) times daily between meals.   furosemide 40 MG tablet Commonly known as:  LASIX Take 1 tablet (40 mg total) by mouth 2 (two) times daily.   ipratropium-albuterol 0.5-2.5 (3) MG/3ML Soln Commonly known as:  DUONEB Take 3 mLs by nebulization every 4 (four) hours as needed.   levofloxacin 750 MG tablet  Commonly known as:  LEVAQUIN Take 1 tablet (750 mg total) by mouth daily for 5 days.   lisinopril 20 MG tablet Commonly known as:  PRINIVIL,ZESTRIL Take 1 tablet (20 mg total) by mouth daily. Start taking on:  01/18/2018   metoprolol tartrate 25 MG tablet Commonly known as:  LOPRESSOR Take 1 tablet (25 mg total) by mouth 2 (two) times  daily.   polyethylene glycol packet Commonly known as:  MIRALAX / GLYCOLAX Take 17 g by mouth daily as needed for mild constipation.            Discharge Care Instructions  (From admission, onward)         Start     Ordered   01/17/18 0000  Discharge wound care:    Comments:  House antimicrobial textile (InterDry Ag+) is provided for intertriginous dermatitis. Topical care orders for calcium alginate dressing, topped with dry gauze and covered with ABD pad and secured with paper tape are provided for the left abdomen and left thigh. A sacral prophylactic dressing is ordered for the intact, but at risk sacral area.   01/17/18 0933            Today   CHIEF COMPLAINT:  Doing well this am Feeling weak in general ready to leave hospital   VITAL SIGNS:  Blood pressure (!) 109/56, pulse 79, temperature 100.3 F (37.9 C), temperature source Axillary, resp. rate 18, height 5\' 4"  (1.626 m), weight (!) 231 kg, SpO2 94 %.   REVIEW OF SYSTEMS:  Review of Systems  Constitutional: Positive for malaise/fatigue. Negative for chills and fever.  HENT: Negative.  Negative for ear discharge, ear pain, hearing loss, nosebleeds and sore throat.   Eyes: Negative.  Negative for blurred vision and pain.  Respiratory: Negative.  Negative for cough, hemoptysis, shortness of breath and wheezing.   Cardiovascular: Positive for leg swelling. Negative for chest pain and palpitations.  Gastrointestinal: Negative.  Negative for abdominal pain, blood in stool, diarrhea, nausea and vomiting.  Genitourinary: Negative.  Negative for dysuria.  Musculoskeletal: Negative.  Negative for back pain.  Skin: Negative.   Neurological: Negative for dizziness, tremors, speech change, focal weakness, seizures and headaches.  Endo/Heme/Allergies: Negative.  Does not bruise/bleed easily.  Psychiatric/Behavioral: Negative.  Negative for depression, hallucinations and suicidal ideas.     PHYSICAL EXAMINATION:   GENERAL:  57 y.o.-year-old patient lying in the bed with no acute distress.  Morbid obesity NECK:  Supple, no jugular venous distention. No thyroid enlargement, no tenderness.  LUNGS: Normal breath sounds bilaterally, no wheezing, rales,rhonchi  No use of accessory muscles of respiration.  CARDIOVASCULAR: S1, S2 normal. No murmurs, rubs, or gallops.  ABDOMEN: Soft, non-tender, non-distended. Bowel sounds present. No organomegaly or mass.  EXTREMITIES: Tonic lymphedema without cyanosis, or clubbing.  PSYCHIATRIC: The patient is alert and oriented x 3.     DATA REVIEW:   CBC Recent Labs  Lab 01/12/18 0315  WBC 8.2  HGB 12.5  HCT 41.8  PLT 287    Chemistries  Recent Labs  Lab 01/12/18 0315  01/17/18 0517  NA 144   < > 142  K 4.1   < > 4.1  CL 101   < > 89*  CO2 34*   < > 44*  GLUCOSE 263*   < > 90  BUN 34*   < > 24*  CREATININE 0.67   < > 0.66  CALCIUM 9.0   < > 8.8*  MG 2.0  --   --  AST 38  --   --   ALT 80*  --   --   ALKPHOS 39  --   --   BILITOT 1.0  --   --    < > = values in this interval not displayed.    Cardiac Enzymes No results for input(s): TROPONINI in the last 168 hours.  Microbiology Results  @MICRORSLT48 @  RADIOLOGY:  Dg Chest 1 View  Result Date: 01/17/2018 CLINICAL DATA:  Fever. EXAM: CHEST  1 VIEW COMPARISON:  01/15/2018. FINDINGS: Trachea is midline. Heart is enlarged, as before. Left PICC tip projects over the SVC. Thoracic aorta is calcified. Suspect mild central pulmonary vascular congestion and central left lower lobe airspace opacification. Body habitus is limiting. IMPRESSION: 1. Left lower lobe airspace opacification may be due to atelectasis or developing pneumonia. 2. Mild central pulmonary vascular congestion. 3.  Aortic atherosclerosis (ICD10-170.0). Electronically Signed   By: Lorin Picket M.D.   On: 01/17/2018 08:25      Allergies as of 01/17/2018   No Known Allergies     Medication List    TAKE these medications    acetaminophen 500 MG tablet Commonly known as:  TYLENOL Take 500-1,000 mg by mouth every 6 (six) hours as needed for headache or pain.   amLODipine 10 MG tablet Commonly known as:  NORVASC Take 1 tablet (10 mg total) by mouth daily. Start taking on:  01/18/2018   feeding supplement (ENSURE ENLIVE) Liqd Take 237 mLs by mouth 3 (three) times daily between meals.   furosemide 40 MG tablet Commonly known as:  LASIX Take 1 tablet (40 mg total) by mouth 2 (two) times daily.   ipratropium-albuterol 0.5-2.5 (3) MG/3ML Soln Commonly known as:  DUONEB Take 3 mLs by nebulization every 4 (four) hours as needed.   levofloxacin 750 MG tablet Commonly known as:  LEVAQUIN Take 1 tablet (750 mg total) by mouth daily for 5 days.   lisinopril 20 MG tablet Commonly known as:  PRINIVIL,ZESTRIL Take 1 tablet (20 mg total) by mouth daily. Start taking on:  01/18/2018   metoprolol tartrate 25 MG tablet Commonly known as:  LOPRESSOR Take 1 tablet (25 mg total) by mouth 2 (two) times daily.   polyethylene glycol packet Commonly known as:  MIRALAX / GLYCOLAX Take 17 g by mouth daily as needed for mild constipation.            Discharge Care Instructions  (From admission, onward)         Start     Ordered   01/17/18 0000  Discharge wound care:    Comments:  House antimicrobial textile (InterDry Ag+) is provided for intertriginous dermatitis. Topical care orders for calcium alginate dressing, topped with dry gauze and covered with ABD pad and secured with paper tape are provided for the left abdomen and left thigh. A sacral prophylactic dressing is ordered for the intact, but at risk sacral area.   01/17/18 0933             Management plans discussed with the patient and she is in agreement. Stable for discharge snf  Patient should follow up with pcp  CODE STATUS:     Code Status Orders  (From admission, onward)         Start     Ordered   01/01/18 2005  Do not attempt  resuscitation (DNR)  Continuous    Question Answer Comment  In the event of cardiac or respiratory ARREST Do not call a "  code blue"   In the event of cardiac or respiratory ARREST Do not perform Intubation, CPR, defibrillation or ACLS   In the event of cardiac or respiratory ARREST Use medication by any route, position, wound care, and other measures to relive pain and suffering. May use oxygen, suction and manual treatment of airway obstruction as needed for comfort.      01/01/18 2005        Code Status History    This patient has a current code status but no historical code status.      TOTAL TIME TAKING CARE OF THIS PATIENT: 38 minutes.    Note: This dictation was prepared with Dragon dictation along with smaller phrase technology. Any transcriptional errors that result from this process are unintentional.  Pheobe Sandiford M.D on 01/17/2018 at 9:33 AM  Between 7am to 6pm - Pager - [phone] After 6pm go to [email or web address] - password [email or web address] [facility]  Office  [phone]  CC: Primary care physician; Patient, No Pcp Per

## 2018-01-17 NOTE — Progress Notes (Signed)
Care of patient taken over from [place], [place].  Patient resting comfortably in bed.  Denies pain.  Awaiting discharge.  Clarise Cruz, RN, BSN

## 2018-01-18 DIAGNOSIS — L8990 Pressure ulcer of unspecified site, unspecified stage: Secondary | ICD-10-CM

## 2018-01-18 LAB — CBC
HCT: 40.1 % (ref 36.0–46.0)
HEMOGLOBIN: 11.7 g/dL — AB (ref 12.0–15.0)
MCH: 32.8 pg (ref 26.0–34.0)
MCHC: 29.2 g/dL — ABNORMAL LOW (ref 30.0–36.0)
MCV: 112.3 fL — AB (ref 80.0–100.0)
Platelets: 179 10*3/uL (ref 150–400)
RBC: 3.57 MIL/uL — ABNORMAL LOW (ref 3.87–5.11)
RDW: 14.6 % (ref 11.5–15.5)
WBC: 6.2 10*3/uL (ref 4.0–10.5)
nRBC: 0 % (ref 0.0–0.2)

## 2018-01-18 NOTE — Progress Notes (Signed)
Nutrition Follow-up  DOCUMENTATION CODES:   Morbid obesity  INTERVENTION:  Continue Ensure Enlive po TID, each supplement provides 350 kcal and 20 grams of protein.  Continue daily MVI.  NUTRITION DIAGNOSIS:   Inadequate oral intake related to inability to eat as evidenced by NPO status.  Resolving.  GOAL:   Patient will meet greater than or equal to 90% of their needs  Progressing.  MONITOR:   Diet advancement, Labs, Weight trends, Skin, I & O's  REASON FOR ASSESSMENT:   Ventilator    ASSESSMENT:   57 year old female with PMHx of morbid obesity, HTN who is admitted with acute hypercapnic respiratory failure with probable obesity hypoventilation syndrome requiring intubation on 11/3, lower extremity cellulitis.  Patient was extubated on 11/8. Diet was advanced to carbohydrate modified on 11/9 but was liberalized to regular on 11/12 as patient does not have diabetes. At first patient was BiPAP dependent but this has not improved. Patient has remained on nasal cannula during the day and BiPAP QHS. She is able to tolerate diet but still not eating adequate protein to meet protein needs. Patient is drinking 2 Ensure per day.  Medications reviewed and include: Lasix 20 mg Q12hrs IV, Levaquin, lisinopril 20 mg daily, metoprolol, MVI daily.  Labs reviewed.  Diet Order:   Diet Order            Diet - low sodium heart healthy        Diet regular Room service appropriate? Yes; Fluid consistency: Thin  Diet effective now             EDUCATION NEEDS:   No education needs have been identified at this time  Skin:  Skin Assessment: Skin Integrity Issues:(venous stasis ulcer right leg; MSAD to abdomen and groin)  Last BM:  01/18/2018 - smear type 4  Height:   Ht Readings from Last 1 Encounters:  01/12/18 5\' 4"  (1.626 m)   Weight:   Wt Readings from Last 1 Encounters:  01/12/18 (!) 231 kg   Ideal Body Weight:  54.5 kg  BMI:  Body mass index is 87.41  kg/m.  Estimated Nutritional Needs:   Kcal:  2800-3100kcal/day   Protein:  >136g/day   Fluid:  >1.6L/day   [name], MS, RD, LDN Office: [phone] Pager: [phone] After Hours/Weekend Pager: [phone]

## 2018-01-18 NOTE — Discharge Summary (Signed)
[name] [place] NAME: Zyra Parrillo    MR#:  [id number]  DATE OF BIRTH:  May 08, 1960  DATE OF ADMISSION:  01/01/2018 ADMITTING PHYSICIAN: Hillary Bow, MD  DATE OF DISCHARGE: 01/18/2018  PRIMARY CARE PHYSICIAN: Patient, No Pcp Per    ADMISSION DIAGNOSIS:  Fall [W19.XXXA] Demand ischemia ([place]) [I24.8] Shock liver [K72.00] Cellulitis of right lower extremity [P82.423] Non-traumatic rhabdomyolysis [M62.82] Sepsis ([place]) [A41.9]  DISCHARGE DIAGNOSIS:  Active Problems:   Sepsis ([place])   Pressure injury of skin   SECONDARY DIAGNOSIS:   Past Medical History:  Diagnosis Date  . Hypertension   . Obesity     HOSPITAL COURSE:   57 year old female with morbid obesity who presented to ER due to fatigue and falls.   1.  Sepsis due to right lower extremity cellulitis with chronic lymphedema: Patient was on broad-spectrum antibiotics and has completed treatment for cellulitis.  2.  Acute hypoxic respiratory failure in the setting of obesity hypoventilation syndrome: Patient has been extubated and now on nasal cannula/BiPAP at night. Patient will need to continue on BiPAP/CPAP at night as per pulmonologist. She would benefit from noninvasive ventilation at home as per pulmonologist. She will have outpatient follow-up with pulmonary.   3.  Acute kidney injury due to sepsis and rhabdomyolysis: Creatinine has improved  4.  Essential hypertension: She will need to continue metoprolol, Norvasc and lisinopril  5.  Elevated troponin due to demand ischemia.  Patient has ruled out for ACS  6.  Morbid obesity: Encouraged weight loss as tolerated  7.  Acute diastolic heart failure with preserved ejection fraction and grade 2 diastolic dysfunction:  Patient has diuresed.  She will need oral Lasix. She is referred to CHF clinic.  She will have outpatient follow-up with Dr. Rockey Situ. She will need to continue monitoring weight daily.  8.   Pneumonia: Patient had low-grade fever which is likely due to atelectasis however due to her body habitus and poor functional status I will prescribe Levaquin for 5 days.  Stop date of Levaquin should be 01/22/2018.  She will need incentive spirometer at bedside.  Wound care  House antimicrobial textile (InterDry Ag+) is provided for intertriginous dermatitis. Topical care orders for calcium alginate dressing, topped with dry gauze and covered with ABD pad and secured with paper tape are provided for the left abdomen and left thigh. A sacral prophylactic dressing is ordered for the intact, but at risk sacral area.  She would benefit from outpatient palliative care services.  DISCHARGE CONDITIONS AND DIET:   stAble for discharge on heart healthy diet  CONSULTS OBTAINED:    DRUG ALLERGIES:  No Known Allergies  DISCHARGE MEDICATIONS:   Allergies as of 01/18/2018   No Known Allergies     Medication List    TAKE these medications   acetaminophen 500 MG tablet Commonly known as:  TYLENOL Take 500-1,000 mg by mouth every 6 (six) hours as needed for headache or pain.   amLODipine 10 MG tablet Commonly known as:  NORVASC Take 1 tablet (10 mg total) by mouth daily.   feeding supplement (ENSURE ENLIVE) Liqd Take 237 mLs by mouth 3 (three) times daily between meals.   furosemide 40 MG tablet Commonly known as:  LASIX Take 1 tablet (40 mg total) by mouth 2 (two) times daily.   ipratropium-albuterol 0.5-2.5 (3) MG/3ML Soln Commonly known as:  DUONEB Take 3 mLs by nebulization every 4 (four) hours as needed.   levofloxacin 750 MG  tablet Commonly known as:  LEVAQUIN Take 1 tablet (750 mg total) by mouth daily for 5 days.   lisinopril 20 MG tablet Commonly known as:  PRINIVIL,ZESTRIL Take 1 tablet (20 mg total) by mouth daily.   metoprolol tartrate 25 MG tablet Commonly known as:  LOPRESSOR Take 1 tablet (25 mg total) by mouth 2 (two) times daily.   polyethylene glycol  packet Commonly known as:  MIRALAX / GLYCOLAX Take 17 g by mouth daily as needed for mild constipation.            Discharge Care Instructions  (From admission, onward)         Start     Ordered   01/17/18 0000  Discharge wound care:    Comments:  House antimicrobial textile (InterDry Ag+) is provided for intertriginous dermatitis. Topical care orders for calcium alginate dressing, topped with dry gauze and covered with ABD pad and secured with paper tape are provided for the left abdomen and left thigh. A sacral prophylactic dressing is ordered for the intact, but at risk sacral area.   01/17/18 0933            Today   CHIEF COMPLAINT:  Doing well this am Feeling weak in general.  No new complaints   VITAL SIGNS:  Blood pressure 138/78, pulse 92, temperature 99.7 F (37.6 C), temperature source Oral, resp. rate 20, height 5\' 4"  (1.626 m), weight (!) 231 kg, SpO2 95 %.   REVIEW OF SYSTEMS:  Review of Systems  Constitutional: Positive for malaise/fatigue. Negative for chills and fever.  HENT: Negative.  Negative for ear discharge, ear pain, hearing loss, nosebleeds and sore throat.   Eyes: Negative.  Negative for blurred vision and pain.  Respiratory: Negative.  Negative for cough, hemoptysis, shortness of breath and wheezing.   Cardiovascular: Positive for leg swelling. Negative for chest pain and palpitations.  Gastrointestinal: Negative.  Negative for abdominal pain, blood in stool, diarrhea, nausea and vomiting.  Genitourinary: Negative.  Negative for dysuria.  Musculoskeletal: Negative.  Negative for back pain.  Skin: Negative.   Neurological: Negative for dizziness, tremors, speech change, focal weakness, seizures and headaches.  Endo/Heme/Allergies: Negative.  Does not bruise/bleed easily.  Psychiatric/Behavioral: Negative.  Negative for depression, hallucinations and suicidal ideas.     PHYSICAL EXAMINATION:  GENERAL:  57 y.o.-year-old patient lying in  the bed with no acute distress.  Morbid obesity NECK:  Supple, no jugular venous distention. No thyroid enlargement, no tenderness.  LUNGS: Normal breath sounds bilaterally, no wheezing, rales,rhonchi  No use of accessory muscles of respiration.  CARDIOVASCULAR: S1, S2 normal. No murmurs, rubs, or gallops.  ABDOMEN: Soft, non-tender, non-distended. Bowel sounds present. No organomegaly or mass.  EXTREMITIES: Tonic lymphedema without cyanosis, or clubbing.  PSYCHIATRIC: The patient is alert and oriented x 3.     DATA REVIEW:   CBC Recent Labs  Lab 01/18/18 0556  WBC 6.2  HGB 11.7*  HCT 40.1  PLT 179    Chemistries  Recent Labs  Lab 01/12/18 0315  01/17/18 0517  NA 144   < > 142  K 4.1   < > 4.1  CL 101   < > 89*  CO2 34*   < > 44*  GLUCOSE 263*   < > 90  BUN 34*   < > 24*  CREATININE 0.67   < > 0.66  CALCIUM 9.0   < > 8.8*  MG 2.0  --   --   AST 38  --   --  ALT 80*  --   --   ALKPHOS 39  --   --   BILITOT 1.0  --   --    < > = values in this interval not displayed.    Cardiac Enzymes No results for input(s): TROPONINI in the last 168 hours.  Microbiology Results  @MICRORSLT48 @  RADIOLOGY:  Dg Chest 1 View  Result Date: 01/17/2018 CLINICAL DATA:  Fever. EXAM: CHEST  1 VIEW COMPARISON:  01/15/2018. FINDINGS: Trachea is midline. Heart is enlarged, as before. Left PICC tip projects over the SVC. Thoracic aorta is calcified. Suspect mild central pulmonary vascular congestion and central left lower lobe airspace opacification. Body habitus is limiting. IMPRESSION: 1. Left lower lobe airspace opacification may be due to atelectasis or developing pneumonia. 2. Mild central pulmonary vascular congestion. 3.  Aortic atherosclerosis (ICD10-170.0). Electronically Signed   By: Lorin Picket M.D.   On: 01/17/2018 08:25      Allergies as of 01/18/2018   No Known Allergies     Medication List    TAKE these medications   acetaminophen 500 MG tablet Commonly known  as:  TYLENOL Take 500-1,000 mg by mouth every 6 (six) hours as needed for headache or pain.   amLODipine 10 MG tablet Commonly known as:  NORVASC Take 1 tablet (10 mg total) by mouth daily.   feeding supplement (ENSURE ENLIVE) Liqd Take 237 mLs by mouth 3 (three) times daily between meals.   furosemide 40 MG tablet Commonly known as:  LASIX Take 1 tablet (40 mg total) by mouth 2 (two) times daily.   ipratropium-albuterol 0.5-2.5 (3) MG/3ML Soln Commonly known as:  DUONEB Take 3 mLs by nebulization every 4 (four) hours as needed.   levofloxacin 750 MG tablet Commonly known as:  LEVAQUIN Take 1 tablet (750 mg total) by mouth daily for 5 days.   lisinopril 20 MG tablet Commonly known as:  PRINIVIL,ZESTRIL Take 1 tablet (20 mg total) by mouth daily.   metoprolol tartrate 25 MG tablet Commonly known as:  LOPRESSOR Take 1 tablet (25 mg total) by mouth 2 (two) times daily.   polyethylene glycol packet Commonly known as:  MIRALAX / GLYCOLAX Take 17 g by mouth daily as needed for mild constipation.            Discharge Care Instructions  (From admission, onward)         Start     Ordered   01/17/18 0000  Discharge wound care:    Comments:  House antimicrobial textile (InterDry Ag+) is provided for intertriginous dermatitis. Topical care orders for calcium alginate dressing, topped with dry gauze and covered with ABD pad and secured with paper tape are provided for the left abdomen and left thigh. A sacral prophylactic dressing is ordered for the intact, but at risk sacral area.   01/17/18 0933             Management plans discussed with the patient and she is in agreement. Stable for discharge snf  Patient should follow up with pcp  CODE STATUS:     Code Status Orders  (From admission, onward)         Start     Ordered   01/01/18 2005  Do not attempt resuscitation (DNR)  Continuous    Question Answer Comment  In the event of cardiac or respiratory ARREST  Do not call a "code blue"   In the event of cardiac or respiratory ARREST Do not perform Intubation, CPR, defibrillation or  ACLS   In the event of cardiac or respiratory ARREST Use medication by any route, position, wound care, and other measures to relive pain and suffering. May use oxygen, suction and manual treatment of airway obstruction as needed for comfort.      01/01/18 2005        Code Status History    This patient has a current code status but no historical code status.      TOTAL TIME TAKING CARE OF THIS PATIENT: 38 minutes.    Note: This dictation was prepared with Dragon dictation along with smaller phrase technology. Any transcriptional errors that result from this process are unintentional.  [name] M.D on 01/18/2018 at 3:48 PM  Between 7am to 6pm - Pager - [phone] After 6pm go to [email or web address] - password [email or web address] [facility]  Office  [phone]  CC: Primary care physician; Patient, No Pcp Per

## 2018-01-18 NOTE — Progress Notes (Signed)
Physical Therapy Treatment Patient Details Name: Stephanie [name] MRN: [id number] DOB: 1960/07/09 Today's Date: 01/18/2018    History of Present Illness 57 y.o. female presenting to hospital 01/01/18 s/p fall (pt found on floor), fatigue, and increasing b/l LE weakness.  Pt noted with multiple areas of skin breakdown (full thickness L abdomen; partial thickness L anterior thigh, R anterior LE at bend in foot and R anterior thigh).  Pt admitted with sepsis secondary R LE cellulitis, AKI, htn, elevated troponin d/t rhabdomyolysis and demand ischemia, conjunctivitis, and acute hypoxic respiratory failure.  Pt intubated 01/02/18 and extubated 01/07/18.  Imaging R tib/fib showing no definitive fx's.  Per notes, pt CO2 retainer.  PMH includes htn, obesity, and chronic wound R LE.    PT Comments    Co-treat with OT, as well as PT tech, RN and CNA in room to assist with mobility.  Max assist sup<>sit EOB, she was able to assist minimally to getting rolling to side in bed, but in sitting struggled to get weight forward and needed heavy assist to stay upright/shoulders forward.  She was able to minimally get feet to the floor and try to put some weight through them, but given her physique(short legs, excessive habitus) she needed assist to use them effectively this date.  Despite some fatigue after in bed mobility and sitting (O2 did drop to low 80s and HR increased to ~110 with activity) she was very eager to participate in some bed exercises and was eager to lean what what was appropriate for her and how she can do some on her own as well.  She c/o continued b/l LE numbness, but ultimately was able to do some minimal AROM with most acts, and with assist to go through ROM even did many with light resistance.  Pt continues to be unsafe to return home and very far from her previous baseline.  Continue to recommend STR.   Follow Up Recommendations  SNF     Equipment Recommendations  (TBD at rehab, will likely need  at least BRW)    Recommendations for Other Services       Precautions / Restrictions Precautions Precautions: Fall Precaution Comments: HOB >30 degrees; prevalon boot? Restrictions Weight Bearing Restrictions: No    Mobility  Bed Mobility Overal bed mobility: Needs Assistance Bed Mobility: Supine to Sit;Sit to Supine     Supine to sit: Total assist;+2 for physical assistance;HOB elevated Sit to supine: Total assist;+2 for physical assistance;HOB elevated   General bed mobility comments: +5 assist for sup<>sit EOB with physical assist >+2 for maintaining sitting EOB, with max cues for foot positioning and for BLE placement.  Pt leaning back into heavy assist at EOB, unable to get shoulders forward.  Pt able to maintain position with heavy assist 3-4 minutes   Transfers                 General transfer comment: unsafe to attempt standing at this point, pt unable to maintain sitting balance  Ambulation/Gait                 Stairs             Wheelchair Mobility    Modified Rankin (Stroke Patients Only)       Balance Overall balance assessment: Needs assistance Sitting-balance support: Bilateral upper extremity supported;Feet supported Sitting balance-Leahy Scale: Poor Sitting balance - Comments: significant assist to maintain upright posture Postural control: Posterior lean  Cognition Arousal/Alertness: Awake/alert Behavior During Therapy: WFL for tasks assessed/performed Overall Cognitive Status: Within Functional Limits for tasks assessed                                 General Comments: slightly anxious for bed mobility      Exercises General Exercises - Lower Extremity Ankle Circles/Pumps: Strengthening;10 reps;Both Quad Sets: Strengthening;10 reps;Both Heel Slides: AAROM;10 reps;Both(with limited range, resisted leg extension) Hip ABduction/ADduction: Strengthening;10  reps Straight Leg Raises: AAROM;10 reps Other Exercises Other Exercises: resisted elbow flexion/ext X 10 b/l Other Exercises: pt educated in energy conservation strategies including pursed lip breathing, AE/DME for LB ADL, positioning, work simplification, and task modification; handout provided    General Comments General comments (skin integrity, edema, etc.): pink foam pads on B heels, wounds noted, RN/CNA in room and aware; O2 at rest/supine w/ HOB elevated 92% on 4L O2, HR 93... sitting EOB with assist O2 91% HR up to 110; supine with bed flat for rolling/bathing/sheet change O2 down to 80%, HR 97 on 4L, O2 incr to 6L per RN, and with HOB elevated, pt recovered quickly with O2 92% and HR 94 with O2 decreased back down to 4L      Pertinent Vitals/Pain Pain Assessment: No/denies pain    Home Living                      Prior Function            PT Goals (current goals can now be found in the care plan section) Acute Rehab PT Goals Patient Stated Goal: to do more for myself and lay on my side in bed again Progress towards PT goals: Progressing toward goals    Frequency    Min 2X/week      PT Plan Current plan remains appropriate    Co-evaluation PT/OT/SLP Co-Evaluation/Treatment: Yes Reason for Co-Treatment: Complexity of the patient's impairments (multi-system involvement);To address functional/ADL transfers PT goals addressed during session: Mobility/safety with mobility;Strengthening/ROM OT goals addressed during session: ADL's and self-care;Proper use of Adaptive equipment and DME      AM-PAC PT "6 Clicks" Daily Activity  Outcome Measure  Difficulty turning over in bed (including adjusting bedclothes, sheets and blankets)?: Unable Difficulty moving from lying on back to sitting on the side of the bed? : Unable Difficulty sitting down on and standing up from a chair with arms (e.g., wheelchair, bedside commode, etc,.)?: Unable Help needed moving to and  from a bed to chair (including a wheelchair)?: Total Help needed walking in hospital room?: Total Help needed climbing 3-5 steps with a railing? : Total 6 Click Score: 6    End of Session Equipment Utilized During Treatment: Oxygen Activity Tolerance: Patient limited by fatigue Patient left: in bed;with call bell/phone within reach;Other (comment) Nurse Communication: Mobility status;Precautions PT Visit Diagnosis: Other abnormalities of gait and mobility (R26.89);Muscle weakness (generalized) (M62.81);History of falling (Z91.81);Difficulty in walking, not elsewhere classified (R26.2)     Time: 6283-1517 PT Time Calculation (min) (ACUTE ONLY): 43 min  Charges:  $Therapeutic Exercise: 8-22 mins $Therapeutic Activity: 8-22 mins                     Kreg Shropshire, DPT 01/18/2018, 5:47 PM

## 2018-01-18 NOTE — Progress Notes (Signed)
Occupational Therapy Treatment Patient Details Name: Stephanie [name] MRN: [id number] DOB: [birth year] Today's Date: 01/18/2018    History of present illness Pt is a 57 y.o. female presenting to hospital 01/01/18 with falls (pt found on floor), fatigue, and increasing weakness.  Pt noted with multiple areas of skin breakdown (full thickness L abdomen; partial thickness L anterior thigh, R anterior LE at bend in foot and R anterior thigh).  Pt admitted with sepsis secondary R LE cellulitis, AKI, htn, elevated troponin d/t rhabdomyolysis and demand ischemia, conjunctivitis, and acute hypoxic respiratory failure.  Pt intubated 01/02/18 and extubated 01/07/18.  Imaging R tib/fib showing no definitive fx's.  Per notes, pt CO2 retainer.  PMH includes htn, obesity, and chronic wound R LE.   OT comments  Pt seen for OT co-tx w/ PT. Prior to PT arrival, pt educated in energy conservation strategies, pursed lip breathing, AE/DME for LB ADL, and positioning to minimize SOB and maximize energy efficiency during ADL tasks. Pt verbalized understanding. Handout provided to support recall and carryover. With PT and nursing assist (+5 physical assist) pt performed sup<>sit EOB. With occasional verbal cues, pt able to utilize proper technique to minimize SOB and pt able to maintain O2 sats on 4L O2 sitting EOB with assist. O2 sats dropped once supine during bed level bathing/dressing task (see details below) but recovered quickly. Pt continues to benefit from skilled OT services to maximize return to PLOF (ambulating without assist and independent with ADL) as well as minimize falls risk, functional decline, caregiver burden, and risk of readmission.  Continue to recommend STR.   Follow Up Recommendations  SNF    Equipment Recommendations  Other (comment)(TBD)    Recommendations for Other Services      Precautions / Restrictions Precautions Precautions: Fall Precaution Comments: HOB >30 degrees; prevalon  boot? Restrictions Weight Bearing Restrictions: No       Mobility Bed Mobility Overal bed mobility: Needs Assistance Bed Mobility: Supine to Sit;Sit to Supine     Supine to sit: Total assist;+2 for physical assistance;HOB elevated Sit to supine: Total assist;+2 for physical assistance;HOB elevated   General bed mobility comments: +5 assist for sup<>sit EOB with physical assist >+2 for maintaining sitting EOB, with max cues for foot positioning and for BLE placement. Please see PT note for additional detail regarding bed mobility.  Transfers                 General transfer comment: deferred, unsafe to attempt    Balance Overall balance assessment: Needs assistance Sitting-balance support: Bilateral upper extremity supported;Feet supported Sitting balance-Leahy Scale: Poor Sitting balance - Comments: significant assist to maintain upright posture                                   ADL either performed or assessed with clinical judgement   ADL Overall ADL's : Needs assistance/impaired                     Lower Body Dressing: Total assistance     Toilet Transfer Details (indicate cue type and reason): rolling in bed  Toileting- Clothing Manipulation and Hygiene: Bed level;Total assistance               Vision Patient Visual Report: No change from baseline     Perception     Praxis      Cognition Arousal/Alertness: Awake/alert Behavior During Therapy: WFL for tasks assessed/performed  Overall Cognitive Status: Within Functional Limits for tasks assessed                                 General Comments: slightly anxious for bed mobility        Exercises Other Exercises Other Exercises: pt educated in energy conservation strategies including pursed lip breathing, AE/DME for LB ADL, positioning, work simplification, and task modification; handout provided   Shoulder Instructions       General Comments pink foam  pads on B heels, wounds noted, RN/CNA in room and aware; O2 at rest/supine w/ HOB elevated 92% on 4L O2, HR 93... sitting EOB with assist O2 91% HR up to 110; supine with bed flat for rolling/bathing/sheet change O2 down to 80%, HR 97 on 4L, O2 incr to 6L per RN, and with HOB elevated, pt recovered quickly with O2 92% and HR 94 with O2 decreased back down to 4L    Pertinent Vitals/ Pain       Pain Assessment: No/denies pain  Home Living                                          Prior Functioning/Environment              Frequency  Min 1X/week        Progress Toward Goals  OT Goals(current goals can now be found in the care plan section)  Progress towards OT goals: Progressing toward goals  Acute Rehab OT Goals Patient Stated Goal: to do more for myself and lay on my side in bed again OT Goal Formulation: With patient Time For Goal Achievement: 01/27/18 Potential to Achieve Goals: Good  Plan Discharge plan remains appropriate;Frequency remains appropriate    Co-evaluation    PT/OT/SLP Co-Evaluation/Treatment: Yes Reason for Co-Treatment: For patient/therapist safety;To address functional/ADL transfers PT goals addressed during session: Mobility/safety with mobility;Balance;Strengthening/ROM OT goals addressed during session: ADL's and self-care;Proper use of Adaptive equipment and DME      AM-PAC PT "6 Clicks" Daily Activity     Outcome Measure   Help from another person eating meals?: None Help from another person taking care of personal grooming?: A Little Help from another person toileting, which includes using toliet, bedpan, or urinal?: Total Help from another person bathing (including washing, rinsing, drying)?: Total Help from another person to put on and taking off regular upper body clothing?: A Little Help from another person to put on and taking off regular lower body clothing?: Total 6 Click Score: 13    End of Session Equipment  Utilized During Treatment: Oxygen(4-6L O2)  OT Visit Diagnosis: Muscle weakness (generalized) (M62.81);History of falling (Z91.81)   Activity Tolerance Patient tolerated treatment well   Patient Left in bed;with call bell/phone within reach;with bed alarm set;with nursing/sitter in room   Nurse Communication          Time: 0998-3382 OT Time Calculation (min): 35 min  Charges: OT General Charges $OT Visit: 1 Visit OT Treatments $Self Care/Home Management : 8-22 mins  Jeni Salles, MPH, MS, OTR/L ascom [phone] 01/18/18, 4:30 PM

## 2018-01-19 ENCOUNTER — Inpatient Hospital Stay: Payer: BLUE CROSS/BLUE SHIELD

## 2018-01-19 LAB — LACTIC ACID, PLASMA
LACTIC ACID, VENOUS: 0.9 mmol/L (ref 0.5–1.9)
Lactic Acid, Venous: 0.8 mmol/L (ref 0.5–1.9)

## 2018-01-19 LAB — PROCALCITONIN: PROCALCITONIN: 0.25 ng/mL

## 2018-01-19 MED ORDER — SODIUM CHLORIDE 0.9 % IV SOLN
2.0000 g | Freq: Three times a day (TID) | INTRAVENOUS | Status: DC
  Administered 2018-01-19 – 2018-01-21 (×6): 2 g via INTRAVENOUS
  Filled 2018-01-19 (×10): qty 2

## 2018-01-19 MED ORDER — VANCOMYCIN HCL 10 G IV SOLR
1250.0000 mg | Freq: Two times a day (BID) | INTRAVENOUS | Status: DC
  Administered 2018-01-20 (×3): 1250 mg via INTRAVENOUS
  Filled 2018-01-19 (×7): qty 1250

## 2018-01-19 MED ORDER — VANCOMYCIN HCL IN DEXTROSE 1-5 GM/200ML-% IV SOLN
1000.0000 mg | Freq: Once | INTRAVENOUS | Status: AC
  Administered 2018-01-19: 19:00:00 1000 mg via INTRAVENOUS
  Filled 2018-01-19: qty 200

## 2018-01-19 NOTE — Progress Notes (Signed)
Per [facility] admissions coordinator [facility] authorization is still pending because [employer] requested updated PT notes where patient is participating. PT is aware of request and worked with patient yesterday. Clinical Education officer, museum (CSW) sent PT note from yesterday to [place].   [employer], LCSW [phone]

## 2018-01-19 NOTE — Progress Notes (Signed)
Per respiratory therapy patient's Bi-pap settings are Ipap: 18, Epap: 8, oxygen: 45%. Clinical Education officer, museum (CSW) will send this information to [place].   [employer], LCSW [phone]

## 2018-01-19 NOTE — Progress Notes (Signed)
Per [facility] admissions coordinator [facility] authorization has been received. Admissions coordinator is aware that patient needs a Bi-pap and will have it at the facility by tomorrow. Clinical Education officer, museum (CSW) sent Bi-pap settings to [place] today. Patient's brother Corene Cornea is aware of above.   [employer], LCSW [phone]

## 2018-01-19 NOTE — Progress Notes (Signed)
[facility] NAME: Stephanie [name]    MR#:  [id number]  DATE OF BIRTH:  [birth year]  SUBJECTIVE:   Patient is not feeling well today, shortness of breath and cough are reported.feels very tired.  Febrile last night REVIEW OF SYSTEMS:    Review of Systems  Constitutional: Negative for fever, chills weight loss HENT: Negative for ear pain, nosebleeds, congestion, facial swelling, rhinorrhea, neck pain, neck stiffness and ear discharge.   Respiratory: Worsening of cough, shortness of breath, denies wheezing  Cardiovascular: Negative for chest pain, palpitations and leg swelling.  Gastrointestinal: Negative for heartburn, abdominal pain, vomiting, diarrhea or consitpation Genitourinary: Negative for dysuria, urgency, frequency, hematuria Musculoskeletal: Negative for back pain or joint pain Neurological: Negative for dizziness, seizures, syncope, focal weakness,  numbness and headaches.  Hematological: Does not bruise/bleed easily.  Psychiatric/Behavioral: Negative for hallucinations, confusion, dysphoric mood    Tolerating Diet: yes      DRUG ALLERGIES:  No Known Allergies  VITALS:  Blood pressure 129/60, pulse 78, temperature 99.5 F (37.5 C), temperature source Oral, resp. rate (!) 23, height 5\' 4"  (1.626 m), weight (!) 231 kg, SpO2 97 %.  PHYSICAL EXAMINATION:  Constitutional: Morbidly obese no distress. HENT: Normocephalic. .Eyes: Conjunctivae and EOM are normal. PERRLA, no scleral icterus.  Neck: Normal ROM. Neck supple. No JVD. No tracheal deviation. CVS: RRR, S1/S2 +, no murmurs, no gallops, no carotid bruit.  Pulmonary: Diminished breath sounds with rales  and crepitation no stridor, rhonchi, wheezes, rales.  Abdominal: Soft. BS +,  no distension, tenderness, rebound or guarding.  Musculoskeletal: Normal range of motion.  3+ lower extremity edema and no tenderness.  Neuro: Alert. CN 2-12 grossly intact. No focal  deficits. Skin: Chronic wound right leg  psychiatric: Normal mood and affect.      LABORATORY PANEL:   CBC Recent Labs  Lab 01/18/18 0556  WBC 6.2  HGB 11.7*  HCT 40.1  PLT 179   ------------------------------------------------------------------------------------------------------------------  Chemistries  Recent Labs  Lab 01/17/18 0517  NA 142  K 4.1  CL 89*  CO2 44*  GLUCOSE 90  BUN 24*  CREATININE 0.66  CALCIUM 8.8*   ------------------------------------------------------------------------------------------------------------------  Cardiac Enzymes No results for input(s): TROPONINI in the last 168 hours. ------------------------------------------------------------------------------------------------------------------  RADIOLOGY:  Dg Chest Port 1 View  Result Date: 01/19/2018 CLINICAL DATA:  Shortness of breath. EXAM: PORTABLE CHEST 1 VIEW COMPARISON:  01/17/2018. FINDINGS: Low lung volumes. Marked cardiac enlargement. Developing pulmonary edema, increasing BILATERAL pulmonary opacities, with small effusions. Retrocardiac density remains, either atelectasis or developing infiltrate. Thoracic atherosclerosis. No osseous findings. IMPRESSION: Marked cardiac enlargement, with developing pulmonary edema. Persistent retrocardiac density, atelectasis versus infiltrate. Electronically Signed   By: Staci Righter M.D.   On: 01/19/2018 12:32     ASSESSMENT AND PLAN:   57 year old female with morbid obesity who presented to ER due to fatigue and falls.   1.  Sepsis due to health care associated pneumonia Patient is started on cefepime and vancomycin Procalcitonin 0.25 lactic acid is 0.8 IV fluids are not considered as the lactic acid is normal and patient has pulmonary edema Follow-up on the blood cultures and urine culture and sensitivity Chest x-ray with the retrocardiac density infiltrate versus atelectasis and pulmonary edema  right lower extremity cellulitis with  chronic lymphedema: Patient was on broad-spectrum antibiotics and has completed treatment for cellulitis.  2.  Acute hypoxic respiratory failure in the setting of healthcare associated pneumonia and underlying obesity hypoventilation syndrome:  Patient has been extubated and now on nasal cannula/BiPAP at night. Patient will need to continue on BiPAP at night as per pulmonologist. She would benefit from BiPAP nightly.  Currently patient is at 45% FiO2,  IPAP18 , EPAP 8   3.  Acute kidney injury due to sepsis and rhabdomyolysis: Creatinine has improved  4.  Essential hypertension: Continue metoprolol, Norvasc and lisinopril  5.  Elevated troponin due to demand ischemia.  Patient has ruled out for ACS  6.  Morbid obesity: Encouraged weight loss as tolerated  7.  Acute diastolic heart failure with preserved ejection fraction and grade 2 diastolic dysfunction: Continue Lasix, dose increased Monitor intake and output with dsodaily weight BMP for am  Na elevated today so likely closely to euvolemia. Management plans discussed with the patient and she is in agreement.  CODE STATUS: dnr  TOTAL TIME TAKING CARE OF THIS PATIENT:37 minutes.     POSSIBLE D/C Monday to snf, DEPENDING ON CLINICAL CONDITION.   [name] M.D on 01/19/2018 at 1:53 PM  Between 7am to 6pm - Pager - [phone]  After 6pm go to [email or web address] - password [email or web address] [facility]  Office  [phone]  CC: Primary care physician; Patient, No Pcp Per  Note: This dictation was prepared with Dragon dictation along with smaller phrase technology. Any transcriptional errors that result from this process are unintentional.

## 2018-01-19 NOTE — Progress Notes (Signed)
Pharmacy Antibiotic Note  Stephanie [name] is a 57 y.o. female admitted on 01/01/2018 with right lower extremity cellulitis, AKI/rhabdomyolysis presenting now with PNA on CXR prior to being discharged. Patient also has been febrile with tachypnea. New urine and blood cultures have been drawn. Pharmacy has been consulted for Cefepime and vancomycin dosing.  Plan:.   1) Start cefepime 2 gm IV Q8H   2) start vancomycin 1000mg  VI now, then 1250 mg IV q12h beginning 6 hours after 1st dose  Pharmacy will continue to follow and adjust as needed to maintain trough 15-20 mcg/ml. Will monitor renal function daily.  Will check trough prior to 4th dose  Vancomycin Kinetics (based on previous Vt level drawn) Using adjusted BW 128.8 kg, CrCl 100 mL/min Vd 90.2L ke 0.087 T1/2 8 hrs  Height: 5\' 4"  (162.6 cm) Weight: (!) 509 lb 4.2 oz (231 kg) IBW/kg (Calculated) : 54.7  Temp (24hrs), Avg:100 F (37.8 C), Min:99.5 F (37.5 C), Max:100.9 F (38.3 C)  Recent Labs  Lab 01/14/18 0444 01/17/18 0517 01/18/18 0556 01/19/18 0914  WBC  --   --  6.2  --   CREATININE 0.61 0.66  --   --   LATICACIDVEN  --   --   --  0.8    Estimated Creatinine Clearance: 157 mL/min (by C-G formula based on SCr of 0.66 mg/dL).    No Known Allergies  Antimicrobials this admission: Cefepime 11/2 >> 11/8 Vancomycin 11/2 >> 11/8 Levofloxacin 11/18>>11/20  Dose adjustments this admission: 11/6 Vanc 1500 mg q 12 adjusted to 1250 mg q12 11/4 Vanc 1250mg  q 8 adjusted to 1500mg  q 12  Microbiology results: 11/20 UCx pending 11/20 BCx pending 11/2 BCx: NGF 11/2 MRSA PCR: positive  Thank you for allowing pharmacy to be a part of this patient's care.  Dallie Piles, PharmD Clinical Pharmacist  01/19/2018 1:24 PM

## 2018-01-20 LAB — BLOOD CULTURE ID PANEL (REFLEXED)
ACINETOBACTER BAUMANNII: NOT DETECTED
CANDIDA ALBICANS: NOT DETECTED
CANDIDA PARAPSILOSIS: NOT DETECTED
Candida glabrata: NOT DETECTED
Candida krusei: NOT DETECTED
Candida tropicalis: NOT DETECTED
ENTEROBACTER CLOACAE COMPLEX: NOT DETECTED
ENTEROBACTERIACEAE SPECIES: NOT DETECTED
Enterococcus species: NOT DETECTED
Escherichia coli: NOT DETECTED
HAEMOPHILUS INFLUENZAE: NOT DETECTED
KLEBSIELLA OXYTOCA: NOT DETECTED
Klebsiella pneumoniae: NOT DETECTED
Listeria monocytogenes: NOT DETECTED
METHICILLIN RESISTANCE: DETECTED — AB
Neisseria meningitidis: NOT DETECTED
PSEUDOMONAS AERUGINOSA: NOT DETECTED
Proteus species: NOT DETECTED
STREPTOCOCCUS PNEUMONIAE: NOT DETECTED
STREPTOCOCCUS PYOGENES: NOT DETECTED
Serratia marcescens: NOT DETECTED
Staphylococcus aureus (BCID): NOT DETECTED
Staphylococcus species: DETECTED — AB
Streptococcus agalactiae: NOT DETECTED
Streptococcus species: NOT DETECTED

## 2018-01-20 LAB — BASIC METABOLIC PANEL
ANION GAP: 7 (ref 5–15)
BUN: 22 mg/dL — ABNORMAL HIGH (ref 6–20)
CALCIUM: 8.9 mg/dL (ref 8.9–10.3)
CHLORIDE: 87 mmol/L — AB (ref 98–111)
CO2: 46 mmol/L — AB (ref 22–32)
Creatinine, Ser: 0.67 mg/dL (ref 0.44–1.00)
GFR calc non Af Amer: >60 mL/min (ref >60)
Glucose, Bld: 102 mg/dL — ABNORMAL HIGH (ref 70–99)
POTASSIUM: 3.7 mmol/L (ref 3.5–5.1)
Sodium: 140 mmol/L (ref 135–145)

## 2018-01-20 LAB — URINE CULTURE
Culture: <10000 — AB
Special Requests: NORMAL

## 2018-01-20 LAB — PROCALCITONIN: Procalcitonin: 0.2 ng/mL

## 2018-01-20 NOTE — Progress Notes (Signed)
PHARMACY - PHYSICIAN COMMUNICATION CRITICAL VALUE ALERT - BLOOD CULTURE IDENTIFICATION (BCID)  Results for orders placed or performed during the hospital encounter of 01/01/18  Blood Culture ID Panel (Reflexed) (Collected: 01/19/2018  9:14 AM)  Result Value Ref Range   Enterococcus species NOT DETECTED NOT DETECTED   Listeria monocytogenes NOT DETECTED NOT DETECTED   Staphylococcus species DETECTED (A) NOT DETECTED   Staphylococcus aureus (BCID) NOT DETECTED NOT DETECTED   Methicillin resistance DETECTED (A) NOT DETECTED   Streptococcus species NOT DETECTED NOT DETECTED   Streptococcus agalactiae NOT DETECTED NOT DETECTED   Streptococcus pneumoniae NOT DETECTED NOT DETECTED   Streptococcus pyogenes NOT DETECTED NOT DETECTED   Acinetobacter baumannii NOT DETECTED NOT DETECTED   Enterobacteriaceae species NOT DETECTED NOT DETECTED   Enterobacter cloacae complex NOT DETECTED NOT DETECTED   Escherichia coli NOT DETECTED NOT DETECTED   Klebsiella oxytoca NOT DETECTED NOT DETECTED   Klebsiella pneumoniae NOT DETECTED NOT DETECTED   Proteus species NOT DETECTED NOT DETECTED   Serratia marcescens NOT DETECTED NOT DETECTED   Haemophilus influenzae NOT DETECTED NOT DETECTED   Neisseria meningitidis NOT DETECTED NOT DETECTED   Pseudomonas aeruginosa NOT DETECTED NOT DETECTED   Candida albicans NOT DETECTED NOT DETECTED   Candida glabrata NOT DETECTED NOT DETECTED   Candida krusei NOT DETECTED NOT DETECTED   Candida parapsilosis NOT DETECTED NOT DETECTED   Candida tropicalis NOT DETECTED NOT DETECTED    Name of physician (or Provider) ContactedAnselm Jungling  Changes to prescribed antibiotics required: none required  Dallie Piles, PharmD 01/20/2018  8:19 AM

## 2018-01-20 NOTE — Progress Notes (Signed)
Occupational Therapy Treatment Patient Details Name: Stephanie [name] MRN: [id number] DOB: 05/19/1960 Today's Date: 01/20/2018    History of present illness 57 y.o. female presenting to hospital 01/01/18 s/p fall (pt found on floor), fatigue, and increasing b/l LE weakness.  Pt noted with multiple areas of skin breakdown (full thickness L abdomen; partial thickness L anterior thigh, R anterior LE at bend in foot and R anterior thigh).  Pt admitted with sepsis secondary R LE cellulitis, AKI, htn, elevated troponin d/t rhabdomyolysis and demand ischemia, conjunctivitis, and acute hypoxic respiratory failure.  Pt intubated 01/02/18 and extubated 01/07/18.  Imaging R tib/fib showing no definitive fx's.  Per notes, pt CO2 retainer.  PMH includes htn, obesity, and chronic wound R LE.   OT comments  Pt seen for OT tx this date focused on education/training in energy conservation strategies and minimizing SOB. Pt educated in energy conservation strategies including pursed lip breathing, AE/DME for LB ADL, physical and environmental positioning, work simplification, and task modification to maximize recall and carryover of learned techniques (pt has handout). Pt verbalized understanding. Nursing staff in at end of session for pt care. Will continue to progress. Continues to benefit from skilled OT services. STR remains appropriate.   Follow Up Recommendations  SNF    Equipment Recommendations  Other (comment)(TBD)    Recommendations for Other Services      Precautions / Restrictions Precautions Precautions: Fall Precaution Comments: HOB >30 degrees; contact (MRSA) Restrictions Weight Bearing Restrictions: No       Mobility Bed Mobility                  Transfers                      Balance                                           ADL either performed or assessed with clinical judgement   ADL                                               Vision Patient Visual Report: No change from baseline     Perception     Praxis      Cognition Arousal/Alertness: Awake/alert Behavior During Therapy: WFL for tasks assessed/performed Overall Cognitive Status: Within Functional Limits for tasks assessed                                          Exercises Other Exercises Other Exercises: pt educated in energy conservation strategies including pursed lip breathing, AE/DME for LB ADL, physical/environmental positioning, work simplification, and task modification to maximize recall and carryover of learned techniques (pt has handout)   Shoulder Instructions       General Comments      Pertinent Vitals/ Pain       Pain Assessment: No/denies pain  Home Living                                          Prior Functioning/Environment  Frequency  Min 1X/week        Progress Toward Goals  OT Goals(current goals can now be found in the care plan section)  Progress towards OT goals: Progressing toward goals  Acute Rehab OT Goals Patient Stated Goal: to do more for myself and lay on my side in bed again OT Goal Formulation: With patient Time For Goal Achievement: 01/27/18 Potential to Achieve Goals: Good  Plan Discharge plan remains appropriate;Frequency remains appropriate    Co-evaluation                 AM-PAC PT "6 Clicks" Daily Activity     Outcome Measure   Help from another person eating meals?: None Help from another person taking care of personal grooming?: A Little Help from another person toileting, which includes using toliet, bedpan, or urinal?: Total Help from another person bathing (including washing, rinsing, drying)?: Total Help from another person to put on and taking off regular upper body clothing?: A Little Help from another person to put on and taking off regular lower body clothing?: Total 6 Click Score: 13    End of Session    OT  Visit Diagnosis: Muscle weakness (generalized) (M62.81);History of falling (Z91.81)   Activity Tolerance Patient tolerated treatment well   Patient Left in bed;with call bell/phone within reach;with bed alarm set;with nursing/sitter in room   Nurse Communication          Time: 4010-2725 OT Time Calculation (min): 15 min  Charges: OT General Charges $OT Visit: 1 Visit OT Treatments $Self Care/Home Management : 8-22 mins  Jeni Salles, MPH, MS, OTR/L ascom [phone] 01/20/18, 4:33 PM

## 2018-01-20 NOTE — Progress Notes (Signed)
[facility] [place] NAME: Stephanie [name]    MR#:  [id number]  DATE OF BIRTH:  [birth year]  SUBJECTIVE:   Patient is not feeling well today, shortness of breath and cough are reported. feels very tired.  Febrile last evening.  REVIEW OF SYSTEMS:    Review of Systems  Constitutional: Negative for fever, chills weight loss HENT: Negative for ear pain, nosebleeds, congestion, facial swelling, rhinorrhea, neck pain, neck stiffness and ear discharge.   Respiratory: Worsening of cough, shortness of breath, denies wheezing  Cardiovascular: Negative for chest pain, palpitations and leg swelling.  Gastrointestinal: Negative for heartburn, abdominal pain, vomiting, diarrhea or consitpation Genitourinary: Negative for dysuria, urgency, frequency, hematuria Musculoskeletal: Negative for back pain or joint pain Neurological: Negative for dizziness, seizures, syncope, focal weakness,  numbness and headaches.  Hematological: Does not bruise/bleed easily.  Psychiatric/Behavioral: Negative for hallucinations, confusion, dysphoric mood    Tolerating Diet: yes   DRUG ALLERGIES:  No Known Allergies  VITALS:  Blood pressure 107/75, pulse 95, temperature 98.6 F (37 C), temperature source Oral, resp. rate (!) 23, height 5\' 4"  (1.626 m), weight (!) 231 kg, SpO2 96 %.  PHYSICAL EXAMINATION:  Constitutional: Morbidly obese no distress. HENT: Normocephalic. .Eyes: Conjunctivae and EOM are normal. PERRLA, no scleral icterus.  Neck: Normal ROM. Neck supple. No JVD. No tracheal deviation. CVS: RRR, S1/S2 +, no murmurs, no gallops, no carotid bruit.  Pulmonary: Diminished breath sounds with rales  and crepitation no stridor, rhonchi, wheezes, rales. On supplemental oxygen. Abdominal: Soft. BS +,  no distension, tenderness, rebound or guarding.  Musculoskeletal: Normal range of motion.  3+ lower extremity edema and no tenderness.  Neuro: Alert. CN 2-12 grossly  intact. No focal deficits. Skin: Chronic wound right leg  psychiatric: Normal mood and affect.     LABORATORY PANEL:   CBC Recent Labs  Lab 01/18/18 0556  WBC 6.2  HGB 11.7*  HCT 40.1  PLT 179   ------------------------------------------------------------------------------------------------------------------  Chemistries  Recent Labs  Lab 01/20/18 0346  NA 140  K 3.7  CL 87*  CO2 46*  GLUCOSE 102*  BUN 22*  CREATININE 0.67  CALCIUM 8.9   ------------------------------------------------------------------------------------------------------------------  Cardiac Enzymes No results for input(s): TROPONINI in the last 168 hours. ------------------------------------------------------------------------------------------------------------------  RADIOLOGY:  Dg Chest Port 1 View  Result Date: 01/19/2018 CLINICAL DATA:  Shortness of breath. EXAM: PORTABLE CHEST 1 VIEW COMPARISON:  01/17/2018. FINDINGS: Low lung volumes. Marked cardiac enlargement. Developing pulmonary edema, increasing BILATERAL pulmonary opacities, with small effusions. Retrocardiac density remains, either atelectasis or developing infiltrate. Thoracic atherosclerosis. No osseous findings. IMPRESSION: Marked cardiac enlargement, with developing pulmonary edema. Persistent retrocardiac density, atelectasis versus infiltrate. Electronically Signed   By: Stephanie [name] M.D.   On: 01/19/2018 12:32     ASSESSMENT AND PLAN:   57 year old female with morbid obesity who presented to ER due to fatigue and falls.   1.  Sepsis due to Cellulitis on leg on presentation and later health care associated pneumonia   Likely bacteremia Patient is started on cefepime and vancomycin Procalcitonin 0.25 lactic acid is 0.8 Chest x-ray with the retrocardiac density infiltrate versus atelectasis and pulmonary edema  right lower extremity cellulitis with chronic lymphedema: Patient was on broad-spectrum antibiotics and has  completed treatment for cellulitis.  Blood cx have gram positive cocci and MRSA on biofire, will get repeat cx today, already on vanc. Will call ID consult..  2.  Acute hypoxic respiratory failure in the setting of  healthcare associated pneumonia and underlying obesity hypoventilation syndrome: Patient has been extubated and now on nasal cannula/BiPAP at night. Patient will need to continue on BiPAP at night as per pulmonologist. She would benefit from BiPAP nightly.  Currently patient is at 45% FiO2,  IPAP18 , EPAP 8   3.  Acute kidney injury due to sepsis and rhabdomyolysis: Creatinine has improved  4.  Essential hypertension: Continue metoprolol, Norvasc and lisinopril  5.  Elevated troponin due to demand ischemia.  Patient has ruled out for ACS  6.  Morbid obesity: Encouraged weight loss as tolerated  7.  Acute diastolic heart failure with preserved ejection fraction and grade 2 diastolic dysfunction: Continue Lasix, dose increased Monitor intake and output with daily weight BMP for am   Management plans discussed with the patient and she is in agreement.  CODE STATUS: dnr  TOTAL TIME TAKING CARE OF THIS PATIENT:37 minutes.    POSSIBLE D/C to snf, DEPENDING ON CLINICAL CONDITION. Will wait for final blood cx and repeat cx.  Stephanie [name] M.D on 01/20/2018 at 12:54 PM  Between 7am to 6pm - Pager - [phone]  After 6pm go to [email or web address] - password [email or web address] [facility]  Office  [phone]  CC: Primary care physician; Patient, No Pcp Per  Note: This dictation was prepared with Dragon dictation along with smaller phrase technology. Any transcriptional errors that result from this process are unintentional.

## 2018-01-20 NOTE — Plan of Care (Signed)
  Problem: Fluid Volume: Goal: Hemodynamic stability will improve Outcome: Progressing   Problem: Clinical Measurements: Goal: Diagnostic test results will improve Outcome: Progressing   Problem: Respiratory: Goal: Ability to maintain adequate ventilation will improve Outcome: Progressing   Problem: Fluid Volume: Goal: Hemodynamic stability will improve Outcome: Progressing   Problem: Clinical Measurements: Goal: Diagnostic test results will improve Outcome: Progressing Goal: Signs and symptoms of infection will decrease Outcome: Progressing   Problem: Respiratory: Goal: Ability to maintain adequate ventilation will improve Outcome: Progressing   Problem: Health Behavior/Discharge Planning: Goal: Ability to manage health-related needs will improve Outcome: Progressing   Problem: Clinical Measurements: Goal: Ability to maintain clinical measurements within normal limits will improve Outcome: Progressing Goal: Diagnostic test results will improve Outcome: Progressing Goal: Respiratory complications will improve Outcome: Progressing Goal: Cardiovascular complication will be avoided Outcome: Progressing   Problem: Activity: Goal: Risk for activity intolerance will decrease Outcome: Progressing Note:  Encouraged to  do passive exercises. A/o.   Problem: Nutrition: Goal: Adequate nutrition will be maintained Outcome: Progressing   Problem: Coping: Goal: Level of anxiety will decrease Outcome: Progressing   Problem: Elimination: Goal: Will not experience complications related to bowel motility Outcome: Progressing   Problem: Pain Managment: Goal: General experience of comfort will improve Outcome: Progressing   Problem: Safety: Goal: Ability to remain free from injury will improve Outcome: Progressing   Problem: Skin Integrity: Goal: Risk for impaired skin integrity will decrease Outcome: Progressing

## 2018-01-21 ENCOUNTER — Inpatient Hospital Stay: Payer: BLUE CROSS/BLUE SHIELD

## 2018-01-21 LAB — CBC
HEMATOCRIT: 40.6 % (ref 36.0–46.0)
Hemoglobin: 12 g/dL (ref 12.0–15.0)
MCH: 32.5 pg (ref 26.0–34.0)
MCHC: 29.6 g/dL — ABNORMAL LOW (ref 30.0–36.0)
MCV: 110 fL — AB (ref 80.0–100.0)
NRBC: 0 % (ref 0.0–0.2)
Platelets: 164 10*3/uL (ref 150–400)
RBC: 3.69 MIL/uL — AB (ref 3.87–5.11)
RDW: 14.2 % (ref 11.5–15.5)
WBC: 7.1 10*3/uL (ref 4.0–10.5)

## 2018-01-21 LAB — BASIC METABOLIC PANEL
ANION GAP: 14 (ref 5–15)
BUN: 19 mg/dL (ref 6–20)
CHLORIDE: 87 mmol/L — AB (ref 98–111)
CO2: 40 mmol/L — AB (ref 22–32)
Calcium: 9 mg/dL (ref 8.9–10.3)
Creatinine, Ser: 0.59 mg/dL (ref 0.44–1.00)
GFR calc Af Amer: >60 mL/min (ref >60)
GFR calc non Af Amer: >60 mL/min (ref >60)
GLUCOSE: 133 mg/dL — AB (ref 70–99)
POTASSIUM: 3.6 mmol/L (ref 3.5–5.1)
Sodium: 141 mmol/L (ref 135–145)

## 2018-01-21 LAB — PROCALCITONIN: Procalcitonin: 0.18 ng/mL

## 2018-01-21 LAB — VANCOMYCIN, TROUGH: Vancomycin Tr: 16 ug/mL (ref 15–20)

## 2018-01-21 MED ORDER — FUROSEMIDE 10 MG/ML IJ SOLN: 40.0000 mg | Freq: Once | INTRAMUSCULAR | Status: DC

## 2018-01-21 MED ORDER — ENOXAPARIN SODIUM 40 MG/0.4ML ~~LOC~~ SOLN: 40.0000 mg | SUBCUTANEOUS | 0 refills | Status: DC

## 2018-01-21 MED ORDER — LEVOFLOXACIN 750 MG PO TABS: 750.0000 mg | ORAL_TABLET | Freq: Every day | ORAL | 0 refills | Status: AC

## 2018-01-21 MED ORDER — POLYVINYL ALCOHOL 1.4 % OP SOLN
1.0000 [drp] | OPHTHALMIC | Status: DC | PRN
  Administered 2018-01-21: 1 [drp] via OPHTHALMIC
  Filled 2018-01-21: qty 15

## 2018-01-21 NOTE — Progress Notes (Signed)
Charge RN called to say pt had been taken off of BIPAP at some point and was currently on 4lpm Erin, BIPAP machine was still alarming and she turned it off.

## 2018-01-21 NOTE — Progress Notes (Signed)
Pharmacy Antibiotic Note  Stephanie [name] is a 57 y.o. female admitted on 01/01/2018 with right lower extremity cellulitis, AKI/rhabdomyolysis presenting now with PNA on CXR prior to being discharged. Patient also has been febrile with tachypnea. New urine and blood cultures have been drawn. Pharmacy has been consulted for Cefepime and vancomycin dosing.  Plan:.   1) cefepime 2 gm IV Q8H   2) start vancomycin 1000mg  VI now, then 1250 mg IV q12h beginning 6 hours after 1st dose Pharmacy will continue to follow and adjust as needed to maintain trough 15-20 mcg/ml. Will monitor renal function daily.  Will check trough prior to 4th dose Vancomycin Kinetics (based on previous Vt level drawn) Using adjusted BW 128.8 kg, CrCl 100 mL/min Vd 90.2L ke 0.087 T1/2 8 hrs  11/22 Vancomycin trough @ 1034= 16 mcg/ml. Will continue current regimen of 1250 mg q12h. F/u Vanc trough prior to dose on 11/25 unless renal fxn changes. BMI 87.  Watch for accumulation in obese patient.     Height: 5\' 4"  (162.6 cm) Weight: (!) 509 lb 4.2 oz (231 kg) IBW/kg (Calculated) : 54.7  Temp (24hrs), Avg:99.3 F (37.4 C), Min:98.2 F (36.8 C), Max:100.6 F (38.1 C)  Recent Labs  Lab 01/17/18 0517 01/18/18 0556 01/19/18 0914 01/19/18 1628 01/20/18 0346 01/21/18 1034  WBC  --  6.2  --   --   --  7.1  CREATININE 0.66  --   --   --  0.67 0.59  LATICACIDVEN  --   --  0.8 0.9  --   --   VANCOTROUGH  --   --   --   --   --  16    Estimated Creatinine Clearance: 157 mL/min (by C-G formula based on SCr of 0.59 mg/dL).    No Known Allergies  Antimicrobials this admission: Cefepime 11/2 >> 11/8 Vancomycin 11/2 >> 11/8 Levofloxacin 11/18>>11/20  Dose adjustments this admission: 11/6 Vanc 1500 mg q 12 adjusted to 1250 mg q12 11/4 Vanc 1250mg  q 8 adjusted to 1500mg  q 12  11/22 Vanc T= 16.  Continue 1250 q12h  Microbiology results: 11/20 UCx pending 11/20 BCx 1 of 4 + CONS 11/21 Bcx: NGTD 11/2 BCx:  NGF 11/2 MRSA PCR: positive  Thank you for allowing pharmacy to be a part of this patient's care.  Noralee Space, PharmD Clinical Pharmacist  01/21/2018 12:16 PM

## 2018-01-21 NOTE — Discharge Summary (Signed)
[facility] NAME: Stephanie [name]    MR#:  [id number]  DATE OF BIRTH:  1960/03/31  DATE OF ADMISSION:  01/01/2018 ADMITTING PHYSICIAN: Hillary Bow, MD  DATE OF DISCHARGE: 01/21/2018  PRIMARY CARE PHYSICIAN: Patient, No Pcp Per    ADMISSION DIAGNOSIS:  Fall [W19.XXXA] Demand ischemia ([place]) [I24.8] Shock liver [K72.00] Cellulitis of right lower extremity [H74.163] Non-traumatic rhabdomyolysis [M62.82] Sepsis ([place]) [A41.9]  DISCHARGE DIAGNOSIS:  Active Problems:   Sepsis ([place])   Pressure injury of skin   SECONDARY DIAGNOSIS:   Past Medical History:  Diagnosis Date  . Hypertension   . Obesity     HOSPITAL COURSE:   57 year old female with morbid obesity who presented to ER due to fatigue and falls.   1.  Sepsis due to Cellulitis on leg on presentation and later health care associated pneumonia Bacteremia ruled out. Blood cx was contamination. Repeat cx negative. Patient is started on cefepime and vancomycin Procalcitonin 0.25 lactic acid is 0.8 Chest x-ray with the retrocardiac density infiltrate versus atelectasis and pulmonary edema  right lower extremity cellulitis with chronic lymphedema: Patient was on broad-spectrum antibiotics and has completed treatment for cellulitis.  Will change to levaquin for 5 days on discharge.  2.  Acute hypoxic respiratory failure in the setting of healthcare associated pneumonia and underlying obesity hypoventilation syndrome: Patient has been extubated and now on nasal cannula/BiPAP at night. Patient will need to continue on BiPAP at night as per pulmonologist. She would benefit from BiPAP nightly.  Currently patient is at 45% FiO2,  IPAP18 , EPAP 8   3.  Acute kidney injury due to sepsis and rhabdomyolysis: Creatinine has improved  4.  Essential hypertension: Continue metoprolol, Norvasc and lisinopril  5.  Elevated troponin due to demand ischemia.  Patient has ruled out  for ACS  6.  Morbid obesity: Encouraged weight loss as tolerated  7.  Acute diastolic heart failure with preserved ejection fraction and grade 2 diastolic dysfunction: Continue Lasix, dose increased Monitor intake and output with daily weight BMP for am   She would benefit from outpatient palliative care services.  DISCHARGE CONDITIONS AND DIET:   stable for discharge on heart healthy diet  CONSULTS OBTAINED:    DRUG ALLERGIES:  No Known Allergies  DISCHARGE MEDICATIONS:   Allergies as of 01/21/2018   No Known Allergies     Medication List    TAKE these medications   acetaminophen 500 MG tablet Commonly known as:  TYLENOL Take 500-1,000 mg by mouth every 6 (six) hours as needed for headache or pain.   amLODipine 10 MG tablet Commonly known as:  NORVASC Take 1 tablet (10 mg total) by mouth daily.   enoxaparin 40 MG/0.4ML injection Commonly known as:  LOVENOX Inject 0.4 mLs (40 mg total) into the skin daily.   feeding supplement (ENSURE ENLIVE) Liqd Take 237 mLs by mouth 3 (three) times daily between meals.   furosemide 40 MG tablet Commonly known as:  LASIX Take 1 tablet (40 mg total) by mouth 2 (two) times daily.   ipratropium-albuterol 0.5-2.5 (3) MG/3ML Soln Commonly known as:  DUONEB Take 3 mLs by nebulization every 4 (four) hours as needed.   levofloxacin 750 MG tablet Commonly known as:  LEVAQUIN Take 1 tablet (750 mg total) by mouth daily for 5 days.   lisinopril 20 MG tablet Commonly known as:  PRINIVIL,ZESTRIL Take 1 tablet (20 mg total) by mouth daily.   metoprolol tartrate 25 MG  tablet Commonly known as:  LOPRESSOR Take 1 tablet (25 mg total) by mouth 2 (two) times daily.   polyethylene glycol packet Commonly known as:  MIRALAX / GLYCOLAX Take 17 g by mouth daily as needed for mild constipation.            Discharge Care Instructions  (From admission, onward)         Start     Ordered   01/17/18 0000  Discharge wound care:     Comments:  House antimicrobial textile (InterDry Ag+) is provided for intertriginous dermatitis. Topical care orders for calcium alginate dressing, topped with dry gauze and covered with ABD pad and secured with paper tape are provided for the left abdomen and left thigh. A sacral prophylactic dressing is ordered for the intact, but at risk sacral area.   01/17/18 0933            Today   CHIEF COMPLAINT:  Doing well this am Feeling weak in general.  No new complaints   VITAL SIGNS:  Blood pressure (!) 107/54, pulse 81, temperature 98.2 F (36.8 C), temperature source Oral, resp. rate 19, height 5\' 4"  (1.626 m), weight (!) 231 kg, SpO2 92 %.   REVIEW OF SYSTEMS:  Review of Systems  Constitutional: Positive for malaise/fatigue. Negative for chills and fever.  HENT: Negative.  Negative for ear discharge, ear pain, hearing loss, nosebleeds and sore throat.   Eyes: Negative.  Negative for blurred vision and pain.  Respiratory: Negative.  Negative for cough, hemoptysis, shortness of breath and wheezing.   Cardiovascular: Positive for leg swelling. Negative for chest pain and palpitations.  Gastrointestinal: Negative.  Negative for abdominal pain, blood in stool, diarrhea, nausea and vomiting.  Genitourinary: Negative.  Negative for dysuria.  Musculoskeletal: Negative.  Negative for back pain.  Skin: Negative.   Neurological: Negative for dizziness, tremors, speech change, focal weakness, seizures and headaches.  Endo/Heme/Allergies: Negative.  Does not bruise/bleed easily.  Psychiatric/Behavioral: Negative.  Negative for depression, hallucinations and suicidal ideas.    PHYSICAL EXAMINATION:  GENERAL:  57 y.o.-year-old patient lying in the bed with no acute distress.  Morbid obesity NECK:  Supple, no jugular venous distention. No thyroid enlargement, no tenderness.  LUNGS: Normal breath sounds bilaterally, no wheezing, rales,rhonchi  No use of accessory muscles of respiration.   CARDIOVASCULAR: S1, S2 normal. No murmurs, rubs, or gallops.  ABDOMEN: Soft, non-tender, non-distended. Bowel sounds present. No organomegaly or mass.  EXTREMITIES: Chonic lymphedema without cyanosis, or clubbing.  Neurology- Cranial nerves intact, Moves all 4 limbs, Power 3/5 in upper limbs and 2/5 in lower limbs. PSYCHIATRIC: The patient is alert and oriented x 3.  Skin- Chronic skin changes due to lymphedema and some ulcers.  DATA REVIEW:   CBC Recent Labs  Lab 01/21/18 1034  WBC 7.1  HGB 12.0  HCT 40.6  PLT 164    Chemistries  Recent Labs  Lab 01/21/18 1034  NA 141  K 3.6  CL 87*  CO2 40*  GLUCOSE 133*  BUN 19  CREATININE 0.59  CALCIUM 9.0    Cardiac Enzymes No results for input(s): TROPONINI in the last 168 hours.  Microbiology Results  @MICRORSLT48 @  RADIOLOGY:  Dg Chest 1 View  Result Date: 01/21/2018 CLINICAL DATA:  Shortness of breath. EXAM: CHEST  1 VIEW COMPARISON:  01/19/2018. FINDINGS: Left PICC line noted with tip over superior vena cava. Cardiomegaly with pulmonary vascular prominence and bilateral interstitial prominence consistent with CHF. No prominent pleural effusion. No pneumothorax.  IMPRESSION: 1.  Left PICC line noted with tip over superior vena cava. 2. Cardiomegaly with pulmonary vascular prominence and bilateral interstitial prominence consistent with CHF. Electronically Signed   By: Marcello Moores  Register   On: 01/21/2018 09:14      Allergies as of 01/21/2018   No Known Allergies     Medication List    TAKE these medications   acetaminophen 500 MG tablet Commonly known as:  TYLENOL Take 500-1,000 mg by mouth every 6 (six) hours as needed for headache or pain.   amLODipine 10 MG tablet Commonly known as:  NORVASC Take 1 tablet (10 mg total) by mouth daily.   enoxaparin 40 MG/0.4ML injection Commonly known as:  LOVENOX Inject 0.4 mLs (40 mg total) into the skin daily.   feeding supplement (ENSURE ENLIVE) Liqd Take 237 mLs by  mouth 3 (three) times daily between meals.   furosemide 40 MG tablet Commonly known as:  LASIX Take 1 tablet (40 mg total) by mouth 2 (two) times daily.   ipratropium-albuterol 0.5-2.5 (3) MG/3ML Soln Commonly known as:  DUONEB Take 3 mLs by nebulization every 4 (four) hours as needed.   levofloxacin 750 MG tablet Commonly known as:  LEVAQUIN Take 1 tablet (750 mg total) by mouth daily for 5 days.   lisinopril 20 MG tablet Commonly known as:  PRINIVIL,ZESTRIL Take 1 tablet (20 mg total) by mouth daily.   metoprolol tartrate 25 MG tablet Commonly known as:  LOPRESSOR Take 1 tablet (25 mg total) by mouth 2 (two) times daily.   polyethylene glycol packet Commonly known as:  MIRALAX / GLYCOLAX Take 17 g by mouth daily as needed for mild constipation.            Discharge Care Instructions  (From admission, onward)         Start     Ordered   01/17/18 0000  Discharge wound care:    Comments:  House antimicrobial textile (InterDry Ag+) is provided for intertriginous dermatitis. Topical care orders for calcium alginate dressing, topped with dry gauze and covered with ABD pad and secured with paper tape are provided for the left abdomen and left thigh. A sacral prophylactic dressing is ordered for the intact, but at risk sacral area.   01/17/18 0933             Management plans discussed with the patient and she is in agreement. Stable for discharge snf  Patient should follow up with pcp  CODE STATUS:     Code Status Orders  (From admission, onward)         Start     Ordered   01/01/18 2005  Do not attempt resuscitation (DNR)  Continuous    Question Answer Comment  In the event of cardiac or respiratory ARREST Do not call a "code blue"   In the event of cardiac or respiratory ARREST Do not perform Intubation, CPR, defibrillation or ACLS   In the event of cardiac or respiratory ARREST Use medication by any route, position, wound care, and other measures to  relive pain and suffering. May use oxygen, suction and manual treatment of airway obstruction as needed for comfort.      01/01/18 2005        Code Status History    This patient has a current code status but no historical code status.      TOTAL TIME TAKING CARE OF THIS PATIENT: 38 minutes.    Note: This dictation was prepared with Viviann Spare  dictation along with smaller phrase technology. Any transcriptional errors that result from this process are unintentional.  Vaughan Basta M.D on 01/21/2018 at 1:09 PM  Between 7am to 6pm - Pager - [phone] After 6pm go to [email or web address] - password [email or web address] [facility]  Office  [phone]  CC: Primary care physician; Patient, No Pcp Per

## 2018-01-21 NOTE — Clinical Social Work Note (Signed)
Patient is medically ready for discharge today. CSW notified patient of discharge today. CSW also notified Chantel at [facility] of discharge today. Patient will be transported by EMS. RN to call report and call for transport.   [place], [place]

## 2018-01-21 NOTE — Progress Notes (Signed)
Attempted to call report to [facility] three different times. Each time the secretary answered and tried to transfer me to a nurse or the director, each time their was no answer. CSW was notified, EMS called for transport.

## 2018-01-21 NOTE — Discharge Instructions (Signed)
bipap to use at night.

## 2018-01-22 LAB — CULTURE, BLOOD (ROUTINE X 2): SPECIAL REQUESTS: ADEQUATE

## 2018-01-24 LAB — CULTURE, BLOOD (ROUTINE X 2)
Culture: NO GROWTH
SPECIAL REQUESTS: ADEQUATE

## 2018-01-25 LAB — CULTURE, BLOOD (SINGLE)
Culture: NO GROWTH
Special Requests: ADEQUATE

## 2018-02-16 ENCOUNTER — Ambulatory Visit (INDEPENDENT_AMBULATORY_CARE_PROVIDER_SITE_OTHER): Payer: BLUE CROSS/BLUE SHIELD | Admitting: Podiatry

## 2018-02-16 ENCOUNTER — Encounter: Payer: Self-pay | Admitting: Podiatry

## 2018-02-16 VITALS — BP 132/76

## 2018-02-16 DIAGNOSIS — L84 Corns and callosities: Secondary | ICD-10-CM

## 2018-02-16 DIAGNOSIS — M79674 Pain in right toe(s): Secondary | ICD-10-CM

## 2018-02-16 DIAGNOSIS — M79675 Pain in left toe(s): Secondary | ICD-10-CM | POA: Diagnosis not present

## 2018-02-16 DIAGNOSIS — B351 Tinea unguium: Secondary | ICD-10-CM | POA: Diagnosis not present

## 2018-02-16 DIAGNOSIS — L853 Xerosis cutis: Secondary | ICD-10-CM

## 2018-02-16 DIAGNOSIS — Z6841 Body Mass Index (BMI) 40.0 and over, adult: Secondary | ICD-10-CM

## 2018-02-16 DIAGNOSIS — I10 Essential (primary) hypertension: Secondary | ICD-10-CM | POA: Insufficient documentation

## 2018-02-16 NOTE — Patient Instructions (Signed)

## 2018-02-17 ENCOUNTER — Telehealth: Payer: Self-pay | Admitting: *Deleted

## 2018-02-17 MED ORDER — AQUAPHOR EX OINT: TOPICAL_OINTMENT | CUTANEOUS | 0 refills | Status: DC

## 2018-02-17 MED ORDER — NONFORMULARY OR COMPOUNDED ITEM: 0 refills | Status: DC

## 2018-02-17 NOTE — Telephone Encounter (Signed)
Dr. Elisha Ponder ordered Domeboro Solution soaks of feet once weekly for 4 weeks, and apply Aquaphor ointment to feet daily for 90 days. Orders and rx faxed to [facility].

## 2018-03-09 ENCOUNTER — Other Ambulatory Visit: Payer: Self-pay

## 2018-03-09 ENCOUNTER — Emergency Department (HOSPITAL_COMMUNITY): Payer: BLUE CROSS/BLUE SHIELD

## 2018-03-09 ENCOUNTER — Encounter (HOSPITAL_COMMUNITY): Payer: Self-pay

## 2018-03-09 ENCOUNTER — Inpatient Hospital Stay (HOSPITAL_COMMUNITY)
Admission: EM | Admit: 2018-03-09 | Discharge: 2018-03-18 | DRG: 193 | Disposition: A | Discharge status: Skilled Nursing Facility | Payer: BLUE CROSS/BLUE SHIELD | Source: Skilled Nursing Facility | Attending: Internal Medicine | Admitting: Internal Medicine

## 2018-03-09 DIAGNOSIS — I471 Supraventricular tachycardia: Secondary | ICD-10-CM | POA: Diagnosis not present

## 2018-03-09 DIAGNOSIS — E662 Morbid (severe) obesity with alveolar hypoventilation: Secondary | ICD-10-CM | POA: Diagnosis present

## 2018-03-09 DIAGNOSIS — I959 Hypotension, unspecified: Secondary | ICD-10-CM | POA: Diagnosis not present

## 2018-03-09 DIAGNOSIS — R0902 Hypoxemia: Secondary | ICD-10-CM

## 2018-03-09 DIAGNOSIS — I5032 Chronic diastolic (congestive) heart failure: Secondary | ICD-10-CM | POA: Diagnosis present

## 2018-03-09 DIAGNOSIS — K649 Unspecified hemorrhoids: Secondary | ICD-10-CM | POA: Diagnosis present

## 2018-03-09 DIAGNOSIS — Z79899 Other long term (current) drug therapy: Secondary | ICD-10-CM | POA: Diagnosis not present

## 2018-03-09 DIAGNOSIS — Z6841 Body Mass Index (BMI) 40.0 and over, adult: Secondary | ICD-10-CM | POA: Diagnosis not present

## 2018-03-09 DIAGNOSIS — K625 Hemorrhage of anus and rectum: Secondary | ICD-10-CM | POA: Diagnosis present

## 2018-03-09 DIAGNOSIS — E875 Hyperkalemia: Secondary | ICD-10-CM | POA: Diagnosis not present

## 2018-03-09 DIAGNOSIS — Z8249 Family history of ischemic heart disease and other diseases of the circulatory system: Secondary | ICD-10-CM

## 2018-03-09 DIAGNOSIS — Y95 Nosocomial condition: Secondary | ICD-10-CM | POA: Diagnosis present

## 2018-03-09 DIAGNOSIS — J9611 Chronic respiratory failure with hypoxia: Secondary | ICD-10-CM | POA: Diagnosis not present

## 2018-03-09 DIAGNOSIS — N179 Acute kidney failure, unspecified: Secondary | ICD-10-CM | POA: Diagnosis present

## 2018-03-09 DIAGNOSIS — R1310 Dysphagia, unspecified: Secondary | ICD-10-CM | POA: Diagnosis present

## 2018-03-09 DIAGNOSIS — Z9981 Dependence on supplemental oxygen: Secondary | ICD-10-CM

## 2018-03-09 DIAGNOSIS — I10 Essential (primary) hypertension: Secondary | ICD-10-CM | POA: Diagnosis present

## 2018-03-09 DIAGNOSIS — J9621 Acute and chronic respiratory failure with hypoxia: Secondary | ICD-10-CM | POA: Diagnosis present

## 2018-03-09 DIAGNOSIS — I110 Hypertensive heart disease with heart failure: Secondary | ICD-10-CM | POA: Diagnosis present

## 2018-03-09 DIAGNOSIS — J189 Pneumonia, unspecified organism: Principal | ICD-10-CM | POA: Diagnosis present

## 2018-03-09 LAB — CBC WITH DIFFERENTIAL/PLATELET
ABS IMMATURE GRANULOCYTES: 0.06 10*3/uL (ref 0.00–0.07)
Basophils Absolute: 0 10*3/uL (ref 0.0–0.1)
Basophils Relative: 0 %
EOS PCT: 4 %
Eosinophils Absolute: 0.4 10*3/uL (ref 0.0–0.5)
HCT: 43.5 % (ref 36.0–46.0)
HEMOGLOBIN: 12.6 g/dL (ref 12.0–15.0)
Immature Granulocytes: 1 %
LYMPHS ABS: 2.7 10*3/uL (ref 0.7–4.0)
LYMPHS PCT: 23 %
MCH: 32.2 pg (ref 26.0–34.0)
MCHC: 29 g/dL — AB (ref 30.0–36.0)
MCV: 111.3 fL — AB (ref 80.0–100.0)
MONO ABS: 0.8 10*3/uL (ref 0.1–1.0)
Monocytes Relative: 7 %
NEUTROS ABS: 7.7 10*3/uL (ref 1.7–7.7)
Neutrophils Relative %: 65 %
Platelets: 213 10*3/uL (ref 150–400)
RBC: 3.91 MIL/uL (ref 3.87–5.11)
RDW: 14 % (ref 11.5–15.5)
WBC: 11.7 10*3/uL — ABNORMAL HIGH (ref 4.0–10.5)
nRBC: 0 % (ref 0.0–0.2)

## 2018-03-09 LAB — CBC
HCT: 44.7 % (ref 36.0–46.0)
Hemoglobin: 12.9 g/dL (ref 12.0–15.0)
MCH: 31.9 pg (ref 26.0–34.0)
MCHC: 28.9 g/dL — ABNORMAL LOW (ref 30.0–36.0)
MCV: 110.6 fL — ABNORMAL HIGH (ref 80.0–100.0)
Platelets: 214 10*3/uL (ref 150–400)
RBC: 4.04 MIL/uL (ref 3.87–5.11)
RDW: 14 % (ref 11.5–15.5)
WBC: 10 10*3/uL (ref 4.0–10.5)
nRBC: 0 % (ref 0.0–0.2)

## 2018-03-09 LAB — CREATININE, SERUM
Creatinine, Ser: 1.82 mg/dL — ABNORMAL HIGH (ref 0.44–1.00)
GFR calc Af Amer: 35 mL/min — ABNORMAL LOW (ref >60)
GFR calc non Af Amer: 31 mL/min — ABNORMAL LOW (ref >60)

## 2018-03-09 LAB — LIPASE, BLOOD: Lipase: 47 U/L (ref 11–51)

## 2018-03-09 LAB — COMPREHENSIVE METABOLIC PANEL
ALK PHOS: 57 U/L (ref 38–126)
ALT: 11 U/L (ref 0–44)
AST: 19 U/L (ref 15–41)
Albumin: 3.2 g/dL — ABNORMAL LOW (ref 3.5–5.0)
Anion gap: 12 (ref 5–15)
BILIRUBIN TOTAL: 0.8 mg/dL (ref 0.3–1.2)
BUN: 100 mg/dL — ABNORMAL HIGH (ref 6–20)
CALCIUM: 9.6 mg/dL (ref 8.9–10.3)
CO2: 33 mmol/L — AB (ref 22–32)
CREATININE: 1.98 mg/dL — AB (ref 0.44–1.00)
Chloride: 92 mmol/L — ABNORMAL LOW (ref 98–111)
GFR, EST AFRICAN AMERICAN: 32 mL/min — AB (ref >60)
GFR, EST NON AFRICAN AMERICAN: 28 mL/min — AB (ref >60)
Glucose, Bld: 108 mg/dL — ABNORMAL HIGH (ref 70–99)
Potassium: 5.1 mmol/L (ref 3.5–5.1)
SODIUM: 137 mmol/L (ref 135–145)
TOTAL PROTEIN: 7.6 g/dL (ref 6.5–8.1)

## 2018-03-09 LAB — BRAIN NATRIURETIC PEPTIDE: B Natriuretic Peptide: 50 pg/mL (ref 0.0–100.0)

## 2018-03-09 LAB — I-STAT CG4 LACTIC ACID, ED
LACTIC ACID, VENOUS: 1.08 mmol/L (ref 0.5–1.9)
Lactic Acid, Venous: 1.28 mmol/L (ref 0.5–1.9)

## 2018-03-09 LAB — INFLUENZA PANEL BY PCR (TYPE A & B)
INFLBPCR: NEGATIVE
Influenza A By PCR: NEGATIVE

## 2018-03-09 LAB — MRSA PCR SCREENING: MRSA by PCR: POSITIVE — AB

## 2018-03-09 LAB — I-STAT TROPONIN, ED: TROPONIN I, POC: 0.01 ng/mL (ref 0.00–0.08)

## 2018-03-09 MED ORDER — ENOXAPARIN SODIUM 100 MG/ML ~~LOC~~ SOLN
0.5000 mg/kg | SUBCUTANEOUS | Status: DC
  Administered 2018-03-09 – 2018-03-18 (×9): 100 mg via SUBCUTANEOUS
  Filled 2018-03-09 (×9): qty 1

## 2018-03-09 MED ORDER — SODIUM CHLORIDE 0.9 % IV BOLUS
1000.0000 mL | Freq: Once | INTRAVENOUS | Status: AC
  Administered 2018-03-09: 1000 mL via INTRAVENOUS

## 2018-03-09 MED ORDER — PIPERACILLIN-TAZOBACTAM 3.375 G IVPB 30 MIN
3.3750 g | Freq: Once | INTRAVENOUS | Status: AC
  Administered 2018-03-09: 3.375 g via INTRAVENOUS
  Filled 2018-03-09: qty 50

## 2018-03-09 MED ORDER — IPRATROPIUM-ALBUTEROL 0.5-2.5 (3) MG/3ML IN SOLN: 3.0000 mL | RESPIRATORY_TRACT | Status: DC | PRN

## 2018-03-09 MED ORDER — VANCOMYCIN HCL 10 G IV SOLR
2500.0000 mg | Freq: Once | INTRAVENOUS | Status: AC
  Administered 2018-03-09: 2500 mg via INTRAVENOUS
  Filled 2018-03-09: qty 2500

## 2018-03-09 MED ORDER — SODIUM CHLORIDE 0.9 % IV SOLN: 1.0000 g | Freq: Three times a day (TID) | INTRAVENOUS | Status: DC

## 2018-03-09 MED ORDER — BOOST PO LIQD
237.0000 mL | Freq: Two times a day (BID) | ORAL | Status: DC
  Administered 2018-03-10 – 2018-03-12 (×2): 237 mL via ORAL
  Filled 2018-03-09 (×7): qty 237

## 2018-03-09 MED ORDER — ENSURE ENLIVE PO LIQD
237.0000 mL | Freq: Three times a day (TID) | ORAL | Status: DC
  Administered 2018-03-09 – 2018-03-18 (×22): 237 mL via ORAL

## 2018-03-09 MED ORDER — SODIUM CHLORIDE 0.9 % IV SOLN
INTRAVENOUS | Status: DC
  Administered 2018-03-09 – 2018-03-10 (×2): via INTRAVENOUS

## 2018-03-09 MED ORDER — ACETAMINOPHEN 500 MG PO TABS
500.0000 mg | ORAL_TABLET | Freq: Four times a day (QID) | ORAL | Status: DC | PRN
  Administered 2018-03-13: 500 mg via ORAL
  Filled 2018-03-09: qty 1

## 2018-03-09 MED ORDER — PRO-STAT SUGAR FREE PO LIQD
30.0000 mL | Freq: Two times a day (BID) | ORAL | Status: DC
  Administered 2018-03-09 – 2018-03-18 (×19): 30 mL via ORAL
  Filled 2018-03-09 (×19): qty 30

## 2018-03-09 MED ORDER — MUPIROCIN 2 % EX OINT
1.0000 "application " | TOPICAL_OINTMENT | Freq: Two times a day (BID) | CUTANEOUS | Status: AC
  Administered 2018-03-09 – 2018-03-13 (×10): 1 via NASAL
  Filled 2018-03-09 (×4): qty 22

## 2018-03-09 MED ORDER — METOPROLOL TARTRATE 25 MG PO TABS
25.0000 mg | ORAL_TABLET | Freq: Two times a day (BID) | ORAL | Status: DC
  Administered 2018-03-09 – 2018-03-18 (×16): 25 mg via ORAL
  Filled 2018-03-09 (×18): qty 1

## 2018-03-09 MED ORDER — SODIUM CHLORIDE 0.9 % IV SOLN
2.0000 g | Freq: Three times a day (TID) | INTRAVENOUS | Status: DC
  Administered 2018-03-09 – 2018-03-13 (×12): 2 g via INTRAVENOUS
  Filled 2018-03-09 (×14): qty 2

## 2018-03-09 MED ORDER — ADULT MULTIVITAMIN W/MINERALS CH
1.0000 | ORAL_TABLET | Freq: Every day | ORAL | Status: DC
  Administered 2018-03-09 – 2018-03-18 (×10): 1 via ORAL
  Filled 2018-03-09 (×10): qty 1

## 2018-03-09 MED ORDER — MAGNESIUM HYDROXIDE 400 MG/5ML PO SUSP: 30.0000 mL | Freq: Two times a day (BID) | ORAL | Status: DC | PRN

## 2018-03-09 MED ORDER — BISACODYL 10 MG RE SUPP: 10.0000 mg | Freq: Every day | RECTAL | Status: DC | PRN

## 2018-03-09 MED ORDER — CHLORHEXIDINE GLUCONATE CLOTH 2 % EX PADS
6.0000 | MEDICATED_PAD | Freq: Every day | CUTANEOUS | Status: AC
  Administered 2018-03-10 – 2018-03-14 (×3): 6 via TOPICAL

## 2018-03-09 MED ORDER — VANCOMYCIN HCL 10 G IV SOLR: 2500.0000 mg | INTRAVENOUS | Status: DC

## 2018-03-09 MED ORDER — POLYETHYLENE GLYCOL 3350 17 G PO PACK: 17.0000 g | PACK | Freq: Every day | ORAL | Status: DC | PRN

## 2018-03-09 NOTE — ED Notes (Signed)
hospitalist at bedside

## 2018-03-09 NOTE — ED Notes (Signed)
Pt difficult stick.  Phlebotomy called for blood cultures

## 2018-03-09 NOTE — Progress Notes (Signed)
At this time RN Jinny Blossom states patient does not need second IV site if patient does she will place another consult

## 2018-03-09 NOTE — ED Notes (Signed)
Bed: VX94 Expected date:  Expected time:  Means of arrival:  Comments: Elderly, congestion

## 2018-03-09 NOTE — ED Notes (Addendum)
Phlebotomy unable to obtain cultures due to pt being difficult stick.  MD made aware

## 2018-03-09 NOTE — Progress Notes (Signed)
A consult was received from an ED physician for vanc/zosyn per pharmacy dosing.  The patient's profile has been reviewed for ht/wt/allergies/indication/available labs.   A one time order has been placed for vanc 2500mg  and Zosyn 3.375g.  Further antibiotics/pharmacy consults should be ordered by admitting physician if indicated.                       Thank you, Kara Mead 03/09/2018  10:05 AM

## 2018-03-09 NOTE — H&P (Signed)
History and Physical    Stephanie [name] YDX:[id number] DOB: 06-19-1960 DOA: 03/09/2018  PCP: Dr. Maricela Bo per patient although she is in the middle of changing Patient coming from: [facility]  I have personally briefly reviewed patient's old medical records in [place]  Chief Complaint: Cough  HPI: Stephanie [name] is a 58 y.o. female with medical history significant of recent discharge in November of this year to rehab at [facility] facility after treatment for sepsis due to cellulitis on her lower extremity as well as acute hypoxic respiratory failure in the setting of healthcare associated pneumonia and acute kidney injury due to sepsis and rhabdo.  Patient presented to the ER today with reports of nasal congestion and cough as well as general malaise while at the rehab center.Marland Kitchen  She was noted to be afebrile although chest x-ray was concerning for pneumonia and labs are concerning for acute kidney injury in the setting of diuresis.    ED Course: Chest x-ray notable for patchy right basal infiltrate, CMP notable for BUN of 100 and a creatinine of 1.98 up from 19 and 0.592 months ago, normal lactate, normal BNP, mildly elevated white count 11.7.  In the ER patient received Vanco and Zosyn, blood cultures were ordered and patient was continued on her chronic O2 of 2 L.  Review of Systems: As per HPI otherwise 10 point review of systems negative.   Past Medical History:  Diagnosis Date  . Hypertension   . Obesity     History reviewed. No pertinent surgical history.   reports that she has never smoked. She has never used smokeless tobacco. She reports previous alcohol use. She reports previous drug use.  On this admission patient denied tobacco alcohol or drugs  No Known Allergies  No family history on file. Family history reviewed and not pertinent  Prior to Admission medications   Medication Sig Start Date End Date Taking? Authorizing Provider    acetaminophen (TYLENOL) 500 MG tablet Take 500-1,000 mg by mouth every 6 (six) hours as needed for headache or pain.   Yes [provider]  Amino Acids-Protein Hydrolys (FEEDING SUPPLEMENT, PRO-STAT SUGAR FREE 64,) LIQD Take 30 mLs by mouth 2 (two) times daily.   Yes [provider]  amLODipine (NORVASC) 10 MG tablet Take 1 tablet (10 mg total) by mouth daily. 01/18/18  Yes Mody, Ulice Bold, MD  bisacodyl (DULCOLAX) 10 MG suppository Place 10 mg rectally every 4 (four) hours as needed for moderate constipation.   Yes [provider]  feeding supplement, ENSURE ENLIVE, (ENSURE ENLIVE) LIQD Take 237 mLs by mouth 3 (three) times daily between meals. Patient taking differently: Take 237 mLs by mouth 2 (two) times daily as needed. (Lunch/Dinner) if only ingests 50% of food 01/17/18  Yes Mody, Sital, MD  furosemide (LASIX) 40 MG tablet Take 1 tablet (40 mg total) by mouth 2 (two) times daily. 01/17/18  Yes Mody, Sital, MD  ipratropium-albuterol (DUONEB) 0.5-2.5 (3) MG/3ML SOLN Take 3 mLs by nebulization every 4 (four) hours as needed. 01/17/18  Yes Mody, Ulice Bold, MD  lactose free nutrition (BOOST) LIQD Take 237 mLs by mouth 2 (two) times daily.   Yes [provider]  lisinopril (PRINIVIL,ZESTRIL) 20 MG tablet Take 1 tablet (20 mg total) by mouth daily. 01/18/18  Yes Mody, Ulice Bold, MD  magnesium hydroxide (MILK OF MAGNESIA) 400 MG/5ML suspension Take 30 mLs by mouth every 12 (twelve) hours as needed for mild constipation.   Yes [provider]  metoprolol tartrate (LOPRESSOR) 25 MG tablet Take 1 tablet (25 mg total) by mouth 2 (two) times daily. 01/17/18  Yes Mody, Ulice Bold, MD  Multiple Vitamins-Minerals (CERTA-VITE PO) Take 1 tablet by mouth daily. Certa-vite w/ anti-oxidants for wound healing   Yes [provider]  polyethylene glycol (MIRALAX / GLYCOLAX) packet Take 17 g by mouth daily as needed for mild constipation. 01/17/18  Yes Mody, Ulice Bold, MD  protein  supplement (RESOURCE BENEPROTEIN) 6 g POWD Take 1 scoop by mouth daily.   Yes [provider]  enoxaparin (LOVENOX) 40 MG/0.4ML injection Inject 0.4 mLs (40 mg total) into the skin daily. Patient not taking: Reported on 03/09/2018 01/21/18   Vaughan Basta, MD  mineral oil-hydrophilic petrolatum (AQUAPHOR) ointment Apply to feet daily for 90 days. Patient not taking: Reported on 03/09/2018 02/17/18   Marzetta Board, DPM  NONFORMULARY OR COMPOUNDED ITEM Domeboro Solution - soak feet for 15-20 minutes weekly for 4 weeks. Patient not taking: Reported on 03/09/2018 02/17/18   Marzetta Board, DPM    Physical Exam: Vitals:   03/09/18 0721 03/09/18 0724 03/09/18 0730 03/09/18 1002  BP:   (!) 107/51 119/67  Pulse:   86 84  Resp:   18 18  Temp:   98 F (36.7 C)   TempSrc:   Oral   SpO2:  94% 93% 92%  Weight: (!) 196.9 kg     Height:   5\' 4"  (1.626 m)     Constitutional: NAD, calm, comfortable Vitals:   03/09/18 0721 03/09/18 0724 03/09/18 0730 03/09/18 1002  BP:   (!) 107/51 119/67  Pulse:   86 84  Resp:   18 18  Temp:   98 F (36.7 C)   TempSrc:   Oral   SpO2:  94% 93% 92%  Weight: (!) 196.9 kg     Height:   5\' 4"  (1.626 m)    Eyes: PERRL, lids and conjunctivae normal ENMT: Mucous membranes are dry. Posterior pharynx clear of any exudate or lesions.Normal dentition.  Neck: normal, supple, no masses, no thyromegaly Respiratory: clear to auscultation bilaterally, no wheezing, no crackles. Normal respiratory effort. No accessory muscle use.  Cardiovascular: Regular rate and rhythm, no murmurs / rubs / gallops. No extremity edema. 2+ pedal pulses. No carotid bruits.  Abdomen: Protuberant but no tenderness, no masses palpated. No hepatosplenomegaly although limited exam secondary body habitus. Bowel sounds positive.  Musculoskeletal: no clubbing / cyanosis. No joint deformity upper and lower extremities. Good ROM, no contractures. Normal muscle tone.  Skin: Healing  lesion on anterior shin right lower extremity Neurologic: CN 2-12 grossly intact. Sensation intact, DTR normal. Strength 5/5 in all 4.  Psychiatric: Normal judgment and insight. Alert and oriented x 3. Normal mood.     Labs on Admission: I have personally reviewed following labs and imaging studies  CBC: Recent Labs  Lab 03/09/18 0822  WBC 11.7*  NEUTROABS 7.7  HGB 12.6  HCT 43.5  MCV 111.3*  PLT 096   Basic Metabolic Panel: Recent Labs  Lab 03/09/18 0822  NA 137  K 5.1  CL 92*  CO2 33*  GLUCOSE 108*  BUN 100*  CREATININE 1.98*  CALCIUM 9.6   GFR: Estimated Creatinine Clearance: 55.9 mL/min (A) (by C-G formula based on SCr of 1.98 mg/dL (H)). Liver Function Tests: Recent Labs  Lab 03/09/18 0822  AST 19  ALT 11  ALKPHOS 57  BILITOT 0.8  PROT 7.6  ALBUMIN 3.2*   Recent Labs  Lab 03/09/18 [phone]  LIPASE 47   No results for input(s): AMMONIA in the last 168 hours. Coagulation Profile: No results for input(s): INR, PROTIME in the last 168 hours. Cardiac Enzymes: No results for input(s): CKTOTAL, CKMB, CKMBINDEX, TROPONINI in the last 168 hours. BNP (last 3 results) No results for input(s): PROBNP in the last 8760 hours. HbA1C: No results for input(s): HGBA1C in the last 72 hours. CBG: No results for input(s): GLUCAP in the last 168 hours. Lipid Profile: No results for input(s): CHOL, HDL, LDLCALC, TRIG, CHOLHDL, LDLDIRECT in the last 72 hours. Thyroid Function Tests: No results for input(s): TSH, T4TOTAL, FREET4, T3FREE, THYROIDAB in the last 72 hours. Anemia Panel: No results for input(s): VITAMINB12, FOLATE, FERRITIN, TIBC, IRON, RETICCTPCT in the last 72 hours. Urine analysis:    Component Value Date/Time   COLORURINE YELLOW (A) 01/17/2018 1239   APPEARANCEUR CLEAR (A) 01/17/2018 1239   LABSPEC 1.012 01/17/2018 1239   PHURINE 6.0 01/17/2018 1239   GLUCOSEU NEGATIVE 01/17/2018 1239   HGBUR SMALL (A) 01/17/2018 1239   BILIRUBINUR NEGATIVE  01/17/2018 1239   KETONESUR NEGATIVE 01/17/2018 1239   PROTEINUR NEGATIVE 01/17/2018 1239   NITRITE POSITIVE (A) 01/17/2018 1239   LEUKOCYTESUR NEGATIVE 01/17/2018 1239    Radiological Exams on Admission: Dg Chest 2 View  Result Date: 03/09/2018 CLINICAL DATA:  Cough and congestion for 2 days EXAM: CHEST - 2 VIEW COMPARISON:  01/21/2018 FINDINGS: Cardiac shadow remains enlarged. Patchy right basilar infiltrate is noted projecting in the right middle and lower lobes on the lateral projection. Left lung is clear. No bony abnormality is noted. Previously seen left-sided PICC line has been removed. IMPRESSION: Patchy right basilar infiltrate. Followup PA and lateral chest X-ray is recommended in 3-4 weeks following trial of antibiotic therapy to ensure resolution and exclude underlying malignancy. Electronically Signed   By: Inez Catalina M.D.   On: 03/09/2018 08:46     Assessment/Plan Principal Problem:   HAP (hospital-acquired pneumonia) Active Problems:   Hypertension   Morbid obesity with BMI of 70 and over, adult ([place])   AKI (acute kidney injury) ([place])   Chronic respiratory failure with hypoxia ([place])  Hospital-acquired pneumonia.  Continue with broad-spectrum antibiotics which I have changed to cefepime and vancomycin.  Follow-up blood cultures, repeat CBC, lactate is normal,  Hypertension.  Will hold patient's diuretics as well as her ACE inhibitor and Norvasc as her blood pressure was moderately soft at 107.  I will continue metoprolol, titrate blood pressure medications and as patient's blood pressure improves from fluid resuscitation.  Acute kidney injury this is consistent with a prerenal picture.  Can gently hydrate the patient with normal saline recheck a BMP in the morning hold ACE inhibitor Norvasc and Lasix as above.  Titrate blood pressure medications back in after patient is fluid resuscitated him at this point is will improve with hydration  Chronic respiratory failure with  hypoxia.  Patient is been on 2 L nasal cannula for approximately 2 to 3 months.  She was discharged on 2 L.  We will continue for now treat infection try to wean off if possible, I suspect is an element of obesity hypoventilation complicating this process  Morbid obesity.  Patient with a BMI greater than 70.  She received weight loss counseling.    DVT prophylaxis: Lovenox Code Status: Full code.  Patient was previously DNR but does desire to be full code per my conversation with patient Family Communication: Discussed plan of care with patient as well as family members present  room to include son.  They are in agreement with the plan of care and are aware of patient's change in CODE STATUS per her request Disposition Plan: Likely discharge in 2 to 3 days to rehab Consults called: None Admission status: Inpatient given expectation patient will take greater than 2 days with multiorgan systems affected   Nicolette Bang MD [facility] Pager [phone]  If 7PM-7AM, please contact night-coverage [email or web address] Password [facility]  03/09/2018, 10:41 AM

## 2018-03-09 NOTE — Progress Notes (Signed)
Pharmacy Antibiotic Note  Stephanie [name] is a 58 y.o. female admitted on 03/09/2018 with pneumonia.  Pharmacy has been consulted for vancomycin and cefepime dosing. Zosyn 3.375 gm given x 1 dose in ED so far.  WBC 11.7, Scr 1.98. AF. CXR: Patchy right basilar infiltrate  Plan: Vancomycin 2500 mg IV loading dose given 1/8 at 1143 am Vancomycin 2500 mg IV Q 48 hrs. Goal AUC 400-550. Expected AUC: 468 SCr used: 1.98 Cefepime 2 gm IV q8h F/u renal fxn, WBC, temp, culture data, MRSA PCR vanc levels as needed  Height: 5\' 4"  (162.6 cm) Weight: (!) 434 lb (196.9 kg) IBW/kg (Calculated) : 54.7  Temp (24hrs), Avg:98 F (36.7 C), Min:98 F (36.7 C), Max:98 F (36.7 C)  Recent Labs  Lab 03/09/18 0822 03/09/18 0905  WBC 11.7*  --   CREATININE 1.98*  --   LATICACIDVEN  --  1.28    Estimated Creatinine Clearance: 55.9 mL/min (A) (by C-G formula based on SCr of 1.98 mg/dL (H)).    No Known Allergies  Antimicrobials this admission: 1/8 zosyn x 1 dose 1/8 vanc>> 1/8 cefepime>> Dose adjustments this admission:  Microbiology results: 1/8 flu A/B neg 1/8 sputum> ordered 1/8 strep pneumo Uag>ordered 1/8 BCx2>> 1/8 MRSA PCR> ordered Thank you for allowing pharmacy to be a part of this patient's care. Eudelia Bunch, Pharm.D [phone] 03/09/2018 12:00 PM

## 2018-03-09 NOTE — Progress Notes (Signed)
Rapid response nurse called d/t MEWS score 5; VS = BP 83/55, 134HR, 98.3, RR 22; 517ml IVF bolus given by RRN;  BP rechecked after 42mins = BP 92/56, HR 92. Will continue to monitor patient.

## 2018-03-09 NOTE — ED Provider Notes (Signed)
[facility] DEPT Provider Note   CSN: [id number] Arrival date & time: 03/09/18  1017     History   Chief Complaint Chief Complaint  Patient presents with  . Cough  . Nasal Congestion    HPI Adelee [name] is a 58 y.o. female.  The history is provided by the patient.  Cough  Cough characteristics:  Non-productive Sputum characteristics:  Nondescript Severity:  Mild Onset quality:  Gradual Timing:  Constant Progression:  Unchanged Chronicity:  New Context: sick contacts   Context comment:  Recent sepsis from leg infection, PNA, patient on 2L of oxygen at rehab. Patient with worsening respiratory symptoms.  Relieved by:  Nothing Worsened by:  Nothing Associated symptoms: chills, fever, myalgias and shortness of breath   Associated symptoms: no chest pain, no ear pain, no rash and no sore throat   Risk factors: recent infection     Past Medical History:  Diagnosis Date  . Hypertension   . Obesity     Patient Active Problem List   Diagnosis Date Noted  . HAP (hospital-acquired pneumonia) 03/09/2018  . AKI (acute kidney injury) ([place]) 03/09/2018  . Chronic respiratory failure with hypoxia ([place]) 03/09/2018  . Hypertension 02/16/2018  . Morbid obesity with BMI of 70 and over, adult ([place]) 02/16/2018  . Pressure injury of skin 01/18/2018  . Sepsis ([place]) 01/01/2018    History reviewed. No pertinent surgical history.   OB History   No obstetric history on file.      Home Medications    Prior to Admission medications   Medication Sig Start Date End Date Taking? Authorizing Provider  acetaminophen (TYLENOL) 500 MG tablet Take 500-1,000 mg by mouth every 6 (six) hours as needed for headache or pain.   Yes [provider]  Amino Acids-Protein Hydrolys (FEEDING SUPPLEMENT, PRO-STAT SUGAR FREE 64,) LIQD Take 30 mLs by mouth 2 (two) times daily.   Yes [provider]  amLODipine (NORVASC) 10 MG tablet Take 1 tablet (10  mg total) by mouth daily. 01/18/18  Yes Mody, Ulice Bold, MD  bisacodyl (DULCOLAX) 10 MG suppository Place 10 mg rectally every 4 (four) hours as needed for moderate constipation.   Yes [provider]  feeding supplement, ENSURE ENLIVE, (ENSURE ENLIVE) LIQD Take 237 mLs by mouth 3 (three) times daily between meals. Patient taking differently: Take 237 mLs by mouth 2 (two) times daily as needed. (Lunch/Dinner) if only ingests 50% of food 01/17/18  Yes Mody, Sital, MD  furosemide (LASIX) 40 MG tablet Take 1 tablet (40 mg total) by mouth 2 (two) times daily. 01/17/18  Yes Mody, Sital, MD  ipratropium-albuterol (DUONEB) 0.5-2.5 (3) MG/3ML SOLN Take 3 mLs by nebulization every 4 (four) hours as needed. 01/17/18  Yes Mody, Ulice Bold, MD  lactose free nutrition (BOOST) LIQD Take 237 mLs by mouth 2 (two) times daily.   Yes [provider]  lisinopril (PRINIVIL,ZESTRIL) 20 MG tablet Take 1 tablet (20 mg total) by mouth daily. 01/18/18  Yes Mody, Ulice Bold, MD  magnesium hydroxide (MILK OF MAGNESIA) 400 MG/5ML suspension Take 30 mLs by mouth every 12 (twelve) hours as needed for mild constipation.   Yes [provider]  metoprolol tartrate (LOPRESSOR) 25 MG tablet Take 1 tablet (25 mg total) by mouth 2 (two) times daily. 01/17/18  Yes Mody, Ulice Bold, MD  Multiple Vitamins-Minerals (CERTA-VITE PO) Take 1 tablet by mouth daily. Certa-vite w/ anti-oxidants for wound healing   Yes [provider]  polyethylene glycol (MIRALAX / GLYCOLAX) packet Take  17 g by mouth daily as needed for mild constipation. 01/17/18  Yes Mody, Ulice Bold, MD  protein supplement (RESOURCE BENEPROTEIN) 6 g POWD Take 1 scoop by mouth daily.   Yes [provider]  enoxaparin (LOVENOX) 40 MG/0.4ML injection Inject 0.4 mLs (40 mg total) into the skin daily. Patient not taking: Reported on 03/09/2018 01/21/18   Vaughan Basta, MD  mineral oil-hydrophilic petrolatum (AQUAPHOR) ointment Apply to feet daily for 90  days. Patient not taking: Reported on 03/09/2018 02/17/18   Marzetta Board, DPM  NONFORMULARY OR COMPOUNDED ITEM Domeboro Solution - soak feet for 15-20 minutes weekly for 4 weeks. Patient not taking: Reported on 03/09/2018 02/17/18   Marzetta Board, DPM    Family History History reviewed. No pertinent family history.  Social History Social History   Tobacco Use  . Smoking status: Never Smoker  . Smokeless tobacco: Never Used  Substance Use Topics  . Alcohol use: Not Currently  . Drug use: Not Currently     Allergies   Patient has no known allergies.   Review of Systems Review of Systems  Constitutional: Positive for chills and fever.  HENT: Negative for ear pain and sore throat.   Eyes: Negative for pain and visual disturbance.  Respiratory: Positive for cough and shortness of breath.   Cardiovascular: Negative for chest pain and palpitations.  Gastrointestinal: Negative for abdominal pain and vomiting.  Genitourinary: Negative for dysuria and hematuria.  Musculoskeletal: Positive for myalgias. Negative for arthralgias and back pain.  Skin: Negative for color change and rash.  Neurological: Negative for seizures and syncope.  All other systems reviewed and are negative.    Physical Exam Updated Vital Signs BP 119/67 (BP Location: Left Arm)   Pulse 84   Temp 98 F (36.7 C) (Oral)   Resp 18   Ht 5\' 4"  (1.626 m)   Wt (!) 196.9 kg   SpO2 92%   BMI 74.50 kg/m   Physical Exam Vitals signs and nursing note reviewed.  Constitutional:      General: She is not in acute distress.    Appearance: She is well-developed.  HENT:     Head: Normocephalic and atraumatic.     Nose: Nose normal.     Mouth/Throat:     Mouth: Mucous membranes are moist.  Eyes:     Extraocular Movements: Extraocular movements intact.     Conjunctiva/sclera: Conjunctivae normal.     Pupils: Pupils are equal, round, and reactive to light.  Neck:     Musculoskeletal: Neck supple.    Cardiovascular:     Rate and Rhythm: Normal rate and regular rhythm.     Heart sounds: No murmur.  Pulmonary:     Effort: No respiratory distress.     Comments: Diminished throughout, coarse breath sounds  Abdominal:     Palpations: Abdomen is soft.     Tenderness: There is no abdominal tenderness.  Musculoskeletal: Normal range of motion.     Right lower leg: No edema.     Left lower leg: No edema.  Skin:    General: Skin is warm and dry.     Capillary Refill: Capillary refill takes less than 2 seconds.     Findings: No rash.  Neurological:     Mental Status: She is alert.  Psychiatric:        Mood and Affect: Mood normal.      ED Treatments / Results  Labs (all labs ordered are listed, but only abnormal results are  displayed) Labs Reviewed  CBC WITH DIFFERENTIAL/PLATELET - Abnormal; Notable for the following components:      Result Value   WBC 11.7 (*)    MCV 111.3 (*)    MCHC 29.0 (*)    All other components within normal limits  COMPREHENSIVE METABOLIC PANEL - Abnormal; Notable for the following components:   Chloride 92 (*)    CO2 33 (*)    Glucose, Bld 108 (*)    BUN 100 (*)    Creatinine, Ser 1.98 (*)    Albumin 3.2 (*)    GFR calc non Af Amer 28 (*)    GFR calc Af Amer 32 (*)    All other components within normal limits  CULTURE, BLOOD (ROUTINE X 2)  CULTURE, BLOOD (ROUTINE X 2)  MRSA PCR SCREENING  LIPASE, BLOOD  BRAIN NATRIURETIC PEPTIDE  INFLUENZA PANEL BY PCR (TYPE A & B)  I-STAT TROPONIN, ED  I-STAT CG4 LACTIC ACID, ED  I-STAT CG4 LACTIC ACID, ED    EKG None  Radiology Dg Chest 2 View  Result Date: 03/09/2018 CLINICAL DATA:  Cough and congestion for 2 days EXAM: CHEST - 2 VIEW COMPARISON:  01/21/2018 FINDINGS: Cardiac shadow remains enlarged. Patchy right basilar infiltrate is noted projecting in the right middle and lower lobes on the lateral projection. Left lung is clear. No bony abnormality is noted. Previously seen left-sided PICC line  has been removed. IMPRESSION: Patchy right basilar infiltrate. Followup PA and lateral chest X-ray is recommended in 3-4 weeks following trial of antibiotic therapy to ensure resolution and exclude underlying malignancy. Electronically Signed   By: Inez Catalina M.D.   On: 03/09/2018 08:46    Procedures .Critical Care Performed by: Lennice Sites, DO Authorized by: Lennice Sites, DO   Critical care provider statement:    Critical care time (minutes):  35   Critical care time was exclusive of:  Separately billable procedures and treating other patients and teaching time   Critical care was necessary to treat or prevent imminent or life-threatening deterioration of the following conditions:  Respiratory failure and dehydration   Critical care was time spent personally by me on the following activities:  Development of treatment plan with patient or surrogate, discussions with primary provider, evaluation of patient's response to treatment, examination of patient, obtaining history from patient or surrogate, ordering and performing treatments and interventions, ordering and review of laboratory studies, ordering and review of radiographic studies, pulse oximetry, re-evaluation of patient's condition and review of old charts   I assumed direction of critical care for this patient from another provider in my specialty: no     (including critical care time)  Medications Ordered in ED Medications  vancomycin (VANCOCIN) 2,500 mg in sodium chloride 0.9 % 500 mL IVPB (2,500 mg Intravenous New Bag/Given 03/09/18 1143)  sodium chloride 0.9 % bolus 1,000 mL (0 mLs Intravenous Stopped 03/09/18 1143)  piperacillin-tazobactam (ZOSYN) IVPB 3.375 g (0 g Intravenous Stopped 03/09/18 1144)     Initial Impression / Assessment and Plan / ED Course  I have reviewed the triage vital signs and the nursing notes.  Pertinent labs & imaging results that were available during my care of the patient were reviewed by me and  considered in my medical decision making (see chart for details).     Mikena Masoner is a 58 year old female with history of chronic respiratory failure, obesity who presents to the ED with cough, body aches.  Patient with normal vitals.  No fever.  Patient  with symptoms for the last several days.  Had recent admission for sepsis from pneumonia, leg infection.  Was sent home on 2 L of oxygen which she continues to be on with good oxygen saturation.  Patient with diminished breath sounds throughout, coarse breath sounds.  No signs of leg infection on exam.  Concern for possible pneumonia, viral process.  Chest x-ray showed possible pneumonia.  However no significant lactic acidosis, negative flu test.  No signs of electrolyte abnormality.  Patient with creatinine of 1.98, BUN of 100 likely from dehydration.  Patient appears clinically dehydrated.  She is also on Lasix.  BNP at normal.  EKG shows sinus rhythm.  No signs of ischemic changes.  Doubt cardiac process.  Troponin within normal limits.  Suspect patient with hospital-acquired pneumonia with acute kidney injury.  Given vancomycin and Zosyn.  Given normal saline bolus.  Remained hemodynamically stable throughout my care.  Admitted to hospital service for further care.  This chart was dictated using voice recognition software.  Despite best efforts to proofread,  errors can occur which can change the documentation meaning.   Final Clinical Impressions(s) / ED Diagnoses   Final diagnoses:  HAP (hospital-acquired pneumonia)  AKI (acute kidney injury) [facility])    ED Discharge Orders    None       Lennice Sites, DO 03/09/18 1152

## 2018-03-09 NOTE — ED Notes (Signed)
ED TO INPATIENT HANDOFF REPORT  Name/Age/Gender Stephanie [name] 58 y.o. female  Code Status Code Status History    Date Active Date Inactive Code Status Order ID Comments User Context   01/01/2018 2005 01/21/2018 1943 DNR [id number]  Hillary Bow, MD ED    Questions for Most Recent Historical Code Status (Order [id number])    Question Answer Comment   In the event of cardiac or respiratory ARREST Do not call a "code blue"    In the event of cardiac or respiratory ARREST Do not perform Intubation, CPR, defibrillation or ACLS    In the event of cardiac or respiratory ARREST Use medication by any route, position, wound care, and other measures to relive pain and suffering. May use oxygen, suction and manual treatment of airway obstruction as needed for comfort.       Home/SNF/Other Rehab  Chief Complaint cough  Level of Care/Admitting Diagnosis ED Disposition    ED Disposition Condition Comment   Admit  Hospital Area: [facility] [[id number]]  Level of Care: Telemetry [5]  Admit to tele based on following criteria: Other see comments  Comments: pna with mod drop in bp  Diagnosis: HAP (hospital-acquired pneumonia) [509326]  Admitting Physician: Marcell Anger [[id number]]  Attending Physician: Jackelyn Knife [[id number]]  Estimated length of stay: past midnight tomorrow  Certification:: I certify this patient will need inpatient services for at least 2 midnights  PT Class (Do Not Modify): Inpatient [101]  PT Acc Code (Do Not Modify): Private [1]       Medical History Past Medical History:  Diagnosis Date  . Hypertension   . Obesity     Allergies No Known Allergies  IV Location/Drains/Wounds Patient Lines/Drains/Airways Status   Active Line/Drains/Airways    Name:   Placement date:   Placement time:   Site:   Days:   Peripheral IV 03/09/18 Right Antecubital   03/09/18    0853    Antecubital   less than 1   External Urinary Catheter    01/10/18    1100    -   58   Pressure Injury 01/14/18 Unstageable - Full thickness tissue loss in which the base of the ulcer is covered by slough (yellow, tan, gray, green or brown) and/or eschar (tan, brown or black) in the wound bed.   01/14/18    2208     54   Pressure Injury 01/14/18 Unstageable - Full thickness tissue loss in which the base of the ulcer is covered by slough (yellow, tan, gray, green or brown) and/or eschar (tan, brown or black) in the wound bed.   01/14/18    2208     54   Wound / Incision (Open or Dehisced) 01/01/18 Venous stasis ulcer Leg Right   01/01/18    2100    Leg   67   Wound / Incision (Open or Dehisced) 01/12/18 Abdomen Left;Lower;Medial   01/12/18    -    Abdomen   56   Wound / Incision (Open or Dehisced) 01/12/18 Thigh Anterior;Left   01/12/18    -    Thigh   56   Wound / Incision (Open or Dehisced) 01/12/18 Thigh Anterior;Right   01/12/18    -    Thigh   58          Labs/Imaging Results for orders placed or performed during the hospital encounter of 03/09/18 (from the past 48 hour(s))  CBC with Differential     Status: Abnormal  Collection Time: 03/09/18  8:22 AM  Result Value Ref Range   WBC 11.7 (H) 4.0 - 10.5 K/uL   RBC 3.91 3.87 - 5.11 MIL/uL   Hemoglobin 12.6 12.0 - 15.0 g/dL   HCT 43.5 36.0 - 46.0 %   MCV 111.3 (H) 80.0 - 100.0 fL   MCH 32.2 26.0 - 34.0 pg   MCHC 29.0 (L) 30.0 - 36.0 g/dL   RDW 14.0 11.5 - 15.5 %   Platelets 213 150 - 400 K/uL   nRBC 0.0 0.0 - 0.2 %   Neutrophils Relative % 65 %   Neutro Abs 7.7 1.7 - 7.7 K/uL   Lymphocytes Relative 23 %   Lymphs Abs 2.7 0.7 - 4.0 K/uL   Monocytes Relative 7 %   Monocytes Absolute 0.8 0.1 - 1.0 K/uL   Eosinophils Relative 4 %   Eosinophils Absolute 0.4 0.0 - 0.5 K/uL   Basophils Relative 0 %   Basophils Absolute 0.0 0.0 - 0.1 K/uL   Immature Granulocytes 1 %   Abs Immature Granulocytes 0.06 0.00 - 0.07 K/uL    Comment: Performed at [facility], [place] [address] [place], [place] [address]  Comprehensive metabolic panel     Status: Abnormal   Collection Time: 03/09/18  8:22 AM  Result Value Ref Range   Sodium 137 135 - 145 mmol/L   Potassium 5.1 3.5 - 5.1 mmol/L   Chloride 92 (L) 98 - 111 mmol/L   CO2 33 (H) 22 - 32 mmol/L   Glucose, Bld 108 (H) 70 - 99 mg/dL   BUN 100 (H) 6 - 20 mg/dL    Comment: RESULTS CONFIRMED BY MANUAL DILUTION   Creatinine, Ser 1.98 (H) 0.44 - 1.00 mg/dL   Calcium 9.6 8.9 - 10.3 mg/dL   Total Protein 7.6 6.5 - 8.1 g/dL   Albumin 3.2 (L) 3.5 - 5.0 g/dL   AST 19 15 - 41 U/L   ALT 11 0 - 44 U/L   Alkaline Phosphatase 57 38 - 126 U/L   Total Bilirubin 0.8 0.3 - 1.2 mg/dL   GFR calc non Af Amer 28 (L) >60 mL/min   GFR calc Af Amer 32 (L) >60 mL/min   Anion gap 12 5 - 15    Comment: Performed at [facility] [place] [address], [place], [place] [address]  Lipase, blood     Status: None   Collection Time: 03/09/18  8:22 AM  Result Value Ref Range   Lipase 47 11 - 51 U/L    Comment: Performed at [facility], [place] [address], [place], [place] [address]  Brain natriuretic peptide     Status: None   Collection Time: 03/09/18  8:22 AM  Result Value Ref Range   B Natriuretic Peptide 50.0 0.0 - 100.0 pg/mL    Comment: Performed at [facility], [place] [address], [place], [place] [address]  Influenza panel by PCR (type A & B)     Status: None   Collection Time: 03/09/18  8:22 AM  Result Value Ref Range   Influenza A By PCR NEGATIVE NEGATIVE   Influenza B By PCR NEGATIVE NEGATIVE    Comment: (NOTE) The Xpert Xpress Flu assay is intended as an aid in the diagnosis of  influenza and should not be used as a sole basis for treatment.  This  assay is FDA approved for nasopharyngeal swab specimens only. Nasal  washings and aspirates are unacceptable for Xpert Xpress  Flu testing. Performed at [facility], [place] [address], [place], [place] [address]   I-Stat  Troponin, ED (not at [facility])     Status: None   Collection Time: 03/09/18  9:00 AM  Result Value Ref Range   Troponin i, poc 0.01 0.00 - 0.08 ng/mL   Comment 3            Comment: Due to the release kinetics of cTnI, a negative result within the first hours of the onset of symptoms does not rule out myocardial infarction with certainty. If myocardial infarction is still suspected, repeat the test at appropriate intervals.   I-Stat CG4 Lactic Acid, ED     Status: None   Collection Time: 03/09/18  9:05 AM  Result Value Ref Range   Lactic Acid, Venous 1.28 0.5 - 1.9 mmol/L   Dg Chest 2 View  Result Date: 03/09/2018 CLINICAL DATA:  Cough and congestion for 2 days EXAM: CHEST - 2 VIEW COMPARISON:  01/21/2018 FINDINGS: Cardiac shadow remains enlarged. Patchy right basilar infiltrate is noted projecting in the right middle and lower lobes on the lateral projection. Left lung is clear. No bony abnormality is noted. Previously seen left-sided PICC line has been removed. IMPRESSION: Patchy right basilar infiltrate. Followup PA and lateral chest X-ray is recommended in 3-4 weeks following trial of antibiotic therapy to ensure resolution and exclude underlying malignancy. Electronically Signed   By: Inez Catalina M.D.   On: 03/09/2018 08:46    Pending Labs Unresulted Labs (From admission, onward)    Start     Ordered   03/09/18 0900  Blood culture (routine x 2)  BLOOD CULTURE X 2,   STAT     03/09/18 0859   Signed and Held  Culture, sputum-assessment  Once,   R     Signed and Held   Signed and Held  Gram stain  Once,   R     Signed and Held   Signed and Held  Strep pneumoniae urinary antigen  Once,   R     Signed and Held   Signed and Held  CBC  (enoxaparin (LOVENOX)    CrCl >/= 30 ml/min)  Once,   R    Comments:  Baseline for enoxaparin therapy IF NOT ALREADY DRAWN.  Notify MD if PLT < 100 K.    Signed and Held   Signed and Held  Creatinine, serum  (enoxaparin (LOVENOX)    CrCl >/= 30 ml/min)   Once,   R    Comments:  Baseline for enoxaparin therapy IF NOT ALREADY DRAWN.    Signed and Held   Signed and Held  Creatinine, serum  (enoxaparin (LOVENOX)    CrCl >/= 30 ml/min)  Weekly,   R    Comments:  while on enoxaparin therapy    Signed and Held   Signed and Held  Basic metabolic panel  Tomorrow morning,   R     Signed and Held   Signed and Held  CBC WITH DIFFERENTIAL  Tomorrow morning,   R     Signed and Held          Vitals/Pain Today's Vitals   03/09/18 0721 03/09/18 0724 03/09/18 0730 03/09/18 1002  BP:   (!) 107/51 119/67  Pulse:   86 84  Resp:   18 18  Temp:   98 F (36.7 C)   TempSrc:   Oral   SpO2:  94% 93% 92%  Weight: (!) 196.9 kg  Height:   5\' 4"  (1.626 m)   PainSc:   0-No pain     Isolation Precautions Droplet precaution  Medications Medications  sodium chloride 0.9 % bolus 1,000 mL (1,000 mLs Intravenous New Bag/Given 03/09/18 1038)  vancomycin (VANCOCIN) 2,500 mg in sodium chloride 0.9 % 500 mL IVPB (has no administration in time range)  piperacillin-tazobactam (ZOSYN) IVPB 3.375 g (3.375 g Intravenous New Bag/Given 03/09/18 1040)    Mobility non-ambulatory

## 2018-03-09 NOTE — ED Notes (Signed)
Patient transported to X-ray 

## 2018-03-09 NOTE — ED Triage Notes (Signed)
Per EMS: Pt from [facility].  Pt hx of chronic respiratory failure with c/o of 2-day cough and congestion.

## 2018-03-10 DIAGNOSIS — J9611 Chronic respiratory failure with hypoxia: Secondary | ICD-10-CM

## 2018-03-10 DIAGNOSIS — N179 Acute kidney failure, unspecified: Secondary | ICD-10-CM

## 2018-03-10 LAB — CBC WITH DIFFERENTIAL/PLATELET
Abs Immature Granulocytes: 0.04 10*3/uL (ref 0.00–0.07)
Basophils Absolute: 0 10*3/uL (ref 0.0–0.1)
Basophils Relative: 0 %
Eosinophils Absolute: 0.5 10*3/uL (ref 0.0–0.5)
Eosinophils Relative: 5 %
HCT: 41.3 % (ref 36.0–46.0)
Hemoglobin: 11.8 g/dL — ABNORMAL LOW (ref 12.0–15.0)
Immature Granulocytes: 0 %
LYMPHS ABS: 2.8 10*3/uL (ref 0.7–4.0)
Lymphocytes Relative: 30 %
MCH: 31.7 pg (ref 26.0–34.0)
MCHC: 28.6 g/dL — ABNORMAL LOW (ref 30.0–36.0)
MCV: 111 fL — ABNORMAL HIGH (ref 80.0–100.0)
Monocytes Absolute: 0.7 10*3/uL (ref 0.1–1.0)
Monocytes Relative: 8 %
NEUTROS PCT: 57 %
Neutro Abs: 5.3 10*3/uL (ref 1.7–7.7)
Platelets: 236 10*3/uL (ref 150–400)
RBC: 3.72 MIL/uL — ABNORMAL LOW (ref 3.87–5.11)
RDW: 14 % (ref 11.5–15.5)
WBC: 9.4 10*3/uL (ref 4.0–10.5)
nRBC: 0 % (ref 0.0–0.2)

## 2018-03-10 LAB — BASIC METABOLIC PANEL
Anion gap: 11 (ref 5–15)
BUN: 100 mg/dL — ABNORMAL HIGH (ref 6–20)
CO2: 32 mmol/L (ref 22–32)
Calcium: 9 mg/dL (ref 8.9–10.3)
Chloride: 97 mmol/L — ABNORMAL LOW (ref 98–111)
Creatinine, Ser: 1.77 mg/dL — ABNORMAL HIGH (ref 0.44–1.00)
GFR calc Af Amer: 37 mL/min — ABNORMAL LOW (ref >60)
GFR calc non Af Amer: 32 mL/min — ABNORMAL LOW (ref >60)
Glucose, Bld: 109 mg/dL — ABNORMAL HIGH (ref 70–99)
POTASSIUM: 5 mmol/L (ref 3.5–5.1)
Sodium: 140 mmol/L (ref 135–145)

## 2018-03-10 LAB — BLOOD GAS, ARTERIAL
Acid-Base Excess: 5.8 mmol/L — ABNORMAL HIGH (ref 0.0–2.0)
Bicarbonate: 34.3 mmol/L — ABNORMAL HIGH (ref 20.0–28.0)
Drawn by: 331471
O2 Content: 5 L/min
O2 Saturation: 93.2 %
Patient temperature: 37
pCO2 arterial: 76.1 mmHg (ref 32.0–48.0)
pH, Arterial: 7.277 — ABNORMAL LOW (ref 7.350–7.450)
pO2, Arterial: 74.8 mmHg — ABNORMAL LOW (ref 83.0–108.0)

## 2018-03-10 LAB — HEMOGLOBIN A1C
HEMOGLOBIN A1C: 6.5 % — AB (ref 4.8–5.6)
Mean Plasma Glucose: 139.85 mg/dL

## 2018-03-10 LAB — GLUCOSE, CAPILLARY: Glucose-Capillary: 101 mg/dL — ABNORMAL HIGH (ref 70–99)

## 2018-03-10 LAB — TSH: TSH: 0.781 u[IU]/mL (ref 0.350–4.500)

## 2018-03-10 LAB — STREP PNEUMONIAE URINARY ANTIGEN: STREP PNEUMO URINARY ANTIGEN: NEGATIVE

## 2018-03-10 LAB — TROPONIN I: Troponin I: 0.04 ng/mL (ref <0.03)

## 2018-03-10 LAB — OCCULT BLOOD X 1 CARD TO LAB, STOOL: Fecal Occult Bld: NEGATIVE

## 2018-03-10 MED ORDER — VANCOMYCIN HCL 10 G IV SOLR
1500.0000 mg | INTRAVENOUS | Status: DC
  Administered 2018-03-10 – 2018-03-11 (×2): 1500 mg via INTRAVENOUS
  Filled 2018-03-10 (×2): qty 1500

## 2018-03-10 MED ORDER — SODIUM CHLORIDE 0.9 % IV BOLUS
500.0000 mL | Freq: Once | INTRAVENOUS | Status: AC
  Administered 2018-03-10: 500 mL via INTRAVENOUS

## 2018-03-10 MED ORDER — SODIUM CHLORIDE 0.9 % IV BOLUS
1000.0000 mL | Freq: Once | INTRAVENOUS | Status: AC
  Administered 2018-03-10: 1000 mL via INTRAVENOUS

## 2018-03-10 NOTE — Progress Notes (Signed)
Pt continues to have low blood pressure, BP 87/55, HR 123, RR22, pt is alert and oriented, sleepy but arousable and follow commands, no c/o of chest pain, EKG done shows sinus tachy with 1st degree AV block w/ premature atrial complexes; provider on call paged with new order carried out.

## 2018-03-10 NOTE — Progress Notes (Signed)
[facility] PROGRESS NOTE  Woodrow Dulski FAO:[id number] DOB: Mar 04, 1960 DOA: 03/09/2018   PCP: Patient, No Pcp Per     Assessment/Plan: Principal Problem:   HAP (hospital-acquired pneumonia) Active Problems:   Hypertension   Morbid obesity with BMI of 70 and over, adult ([place])   AKI (acute kidney injury) ([place])   Chronic respiratory failure with hypoxia ([place])    58 year old female with a history of hypertension, morbid obesity, recent history of sepsis due to cellulitis,/acute hypoxic respiratory failure,  , who presented to the ED with malaise, cough, congestion, chest x-ray of notable for right basilar infiltrate, patient also found to have acute kidney injury with a creatinine of 1.98. Patient also had a rapid response last night and was noted to be hypotensive tachycardic with decreased urine output. Patient is now admitted for evaluation and treatment   Assessment and plan  Hospital-acquired pneumonia.  Continue with broad-spectrum antibiotics   cefepime and vancomycin. MRSA PCR positive. urine streptococcal antigen pending. Influenza panel negative. Given location of pneumonia will obtain SLP evaluation to rule out aspiration.Follow-up blood cultures, repeat CBC, lactate is normal,follow-up chest x-ray in 3-4 weeks to ensure resolution and exclude malignancy  Hypertension.  Will hold patient's diuretics as well as her ACE inhibitor and Norvasc as her blood pressure was moderately soft at 107. Continue metoprolol,  With hold parameters   Acute kidney injury this is consistent with a prerenal picture.  Can gently hydrate the patient with normal saline recheck a BMP in the morning hold ACE inhibitor Norvasc and Lasix as above.  Titrate blood pressure medications back in after patient is fluid resuscitated him at this point is will improve with hydration  Chronic respiratory failure with hypoxia/obesity hypoventilation syndrome.  Patient is been on 2 L nasal cannula for  approximately 2 to 3 months.  She was discharged on 2 L.  We will continue for now treat infection try to wean off oxygen if possible . Most recent 2-D echo  Showed EF of 60-65% with grade 2  diastolic dysfunction. Avoid excessive volume overload . She was also noted to have chronic CO2 retention on her last ABG , confirmed an ABG from this morning. Trial of BiPAP, will likely need BiPAP instead of CPAP daily at bedtime  Morbid obesity.  Patient with a BMI greater than 70.  She received weight loss counseling.  Rectal bleeding Noted by nursing Patient's stool was brown Likely hemorrhoidal bleeding Patient states she's never had a colonoscopy Hemoglobin has been stable Likely not a candidate for conscious sedation for colonoscopy in the setting of respiratory failure, will request GI to see only if the patient is decompensating GI bleeding     DVT prophylaxsis  Lovenox  Code Status: full code     Family Communication: Discussed in detail with the patient, all imaging results, lab results explained to the patient   Disposition Plan:  Medically unstable for discharge     Consultants:  none  Procedures:  none  Antibiotics: Anti-infectives (From admission, onward)   Start     Dose/Rate Route Frequency Ordered Stop   03/11/18 1000  vancomycin (VANCOCIN) 2,500 mg in sodium chloride 0.9 % 500 mL IVPB     2,500 mg 250 mL/hr over 120 Minutes Intravenous Every 48 hours 03/09/18 1159     03/09/18 1600  ceFEPIme (MAXIPIME) 2 g in sodium chloride 0.9 % 100 mL IVPB     2 g 200 mL/hr over 30 Minutes Intravenous Every  8 hours 03/09/18 1159     03/09/18 1400  ceFEPIme (MAXIPIME) 1 g in sodium chloride 0.9 % 100 mL IVPB  Status:  Discontinued     1 g 200 mL/hr over 30 Minutes Intravenous Every 8 hours 03/09/18 1242 03/09/18 1248   03/09/18 1015  vancomycin (VANCOCIN) 2,500 mg in sodium chloride 0.9 % 500 mL IVPB     2,500 mg 250 mL/hr over 120 Minutes Intravenous  Once 03/09/18 1007  03/09/18 1343   03/09/18 1015  piperacillin-tazobactam (ZOSYN) IVPB 3.375 g     3.375 g 100 mL/hr over 30 Minutes Intravenous  Once 03/09/18 1007 03/09/18 1144         HPI/Subjective: Patient awake, heart rate has improved, rectal bleeding noted during this visit described as perirectal bleeding with brown stool  Objective: Vitals:   03/09/18 2256 03/10/18 0207 03/10/18 0216 03/10/18 0457  BP: (!) 101/51 (!) 87/55  (!) 93/50  Pulse: 91 (!) 123 93 (!) 116  Resp:  (!) 22  20  Temp:  98.2 F (36.8 C)  98.2 F (36.8 C)  TempSrc:  Oral  Oral  SpO2:  94%  94%  Weight:      Height:        Intake/Output Summary (Last 24 hours) at 03/10/2018 0843 Last data filed at 03/10/2018 0600 Gross per 24 hour  Intake 4949.65 ml  Output 450 ml  Net 4499.65 ml    Exam:  Examination:  General exam: morbidly obese Respiratory system: Clear to auscultation. Respiratory effort normal. Cardiovascular system: S1 & S2 heard, RRR. No JVD, murmurs, rubs, gallops or clicks. No pedal edema. Gastrointestinal system: Abdomen is nondistended, soft and nontender. No organomegaly or masses felt. Normal bowel sounds heard. Central nervous system: Alert and oriented. No focal neurological deficits. Extremities: Symmetric 5 x 5 power. Skin: No rashes, lesions or ulcers Psychiatry: Judgement and insight appear normal. Mood & affect appropriate.     Data Reviewed: I have personally reviewed following labs and imaging studies  Micro Results Recent Results (from the past 240 hour(s))  Blood culture (routine x 2)     Status: None (Preliminary result)   Collection Time: 03/09/18  1:16 PM  Result Value Ref Range Status   Specimen Description   Final    BLOOD RIGHT HAND Performed at [facility] [address] [place], [place] [address]    Special Requests   Final    BOTTLES DRAWN AEROBIC AND ANAEROBIC Blood Culture adequate volume Performed at [facility]  [address] [place], [place] [address]    Culture   Final    NO GROWTH < 24 HOURS Performed at [facility] [address] [place], [place] [address]    Report Status PENDING  Incomplete  MRSA PCR Screening     Status: Abnormal   Collection Time: 03/09/18  1:23 PM  Result Value Ref Range Status   MRSA by PCR POSITIVE (A) NEGATIVE Final    Comment:        The GeneXpert MRSA Assay (FDA approved for NASAL specimens only), is one component of a comprehensive MRSA colonization surveillance program. It is not intended to diagnose MRSA infection nor to guide or monitor treatment for MRSA infections. RESULT CALLED TO, READ BACK BY AND VERIFIED WITH: Letta Moynahan [id number] @ [address] [place] Performed at [facility] [address], [place], [place] [address]   Blood culture (routine x 2)  Status: None (Preliminary result)   Collection Time: 03/09/18  1:26 PM  Result Value Ref Range Status   Specimen Description   Final    BLOOD LEFT HAND Performed at [facility] [address] [place], [place] [address]    Special Requests   Final    BOTTLES DRAWN AEROBIC AND ANAEROBIC Blood Culture adequate volume Performed at [facility] [address] [place], [place] [address]    Culture   Final    NO GROWTH < 24 HOURS Performed at [facility] [address], [place],  [address]    Report Status PENDING  Incomplete    Radiology Reports Dg Chest 2 View  Result Date: 03/09/2018 CLINICAL DATA:  Cough and congestion for 2 days EXAM: CHEST - 2 VIEW COMPARISON:  01/21/2018 FINDINGS: Cardiac shadow remains enlarged. Patchy right basilar infiltrate is noted projecting in the right middle and lower lobes on the lateral projection. Left lung is clear. No bony abnormality is noted. Previously seen left-sided PICC line has been removed. IMPRESSION: Patchy right basilar infiltrate. Followup PA and lateral chest X-ray  is recommended in 3-4 weeks following trial of antibiotic therapy to ensure resolution and exclude underlying malignancy. Electronically Signed   By: Inez Catalina M.D.   On: 03/09/2018 08:46     CBC Recent Labs  Lab 03/09/18 0822 03/09/18 1326 03/10/18 0432  WBC 11.7* 10.0 9.4  HGB 12.6 12.9 11.8*  HCT 43.5 44.7 41.3  PLT 213 214 236  MCV 111.3* 110.6* 111.0*  MCH 32.2 31.9 31.7  MCHC 29.0* 28.9* 28.6*  RDW 14.0 14.0 14.0  LYMPHSABS 2.7  --  2.8  MONOABS 0.8  --  0.7  EOSABS 0.4  --  0.5  BASOSABS 0.0  --  0.0    Chemistries  Recent Labs  Lab 03/09/18 0822 03/09/18 1326 03/10/18 0432  NA 137  --  140  K 5.1  --  5.0  CL 92*  --  97*  CO2 33*  --  32  GLUCOSE 108*  --  109*  BUN 100*  --  100*  CREATININE 1.98* 1.82* 1.77*  CALCIUM 9.6  --  9.0  AST 19  --   --   ALT 11  --   --   ALKPHOS 57  --   --   BILITOT 0.8  --   --    ------------------------------------------------------------------------------------------------------------------ estimated creatinine clearance is 62.5 mL/min (A) (by C-G formula based on SCr of 1.77 mg/dL (H)). ------------------------------------------------------------------------------------------------------------------ No results for input(s): HGBA1C in the last 72 hours. ------------------------------------------------------------------------------------------------------------------ No results for input(s): CHOL, HDL, LDLCALC, TRIG, CHOLHDL, LDLDIRECT in the last 72 hours. ------------------------------------------------------------------------------------------------------------------ No results for input(s): TSH, T4TOTAL, T3FREE, THYROIDAB in the last 72 hours.  Invalid input(s): FREET3 ------------------------------------------------------------------------------------------------------------------ No results for input(s): VITAMINB12, FOLATE, FERRITIN, TIBC, IRON, RETICCTPCT in the last 72 hours.  Coagulation profile No results  for input(s): INR, PROTIME in the last 168 hours.  No results for input(s): DDIMER in the last 72 hours.  Cardiac Enzymes No results for input(s): CKMB, TROPONINI, MYOGLOBIN in the last 168 hours.  Invalid input(s): CK ------------------------------------------------------------------------------------------------------------------ Invalid input(s): POCBNP   CBG: No results for input(s): GLUCAP in the last 168 hours.     Studies: Dg Chest 2 View  Result Date: 03/09/2018 CLINICAL DATA:  Cough and congestion for 2 days EXAM: CHEST - 2 VIEW COMPARISON:  01/21/2018 FINDINGS: Cardiac shadow remains enlarged. Patchy right basilar infiltrate is noted projecting in  the right middle and lower lobes on the lateral projection. Left lung is clear. No bony abnormality is noted. Previously seen left-sided PICC line has been removed. IMPRESSION: Patchy right basilar infiltrate. Followup PA and lateral chest X-ray is recommended in 3-4 weeks following trial of antibiotic therapy to ensure resolution and exclude underlying malignancy. Electronically Signed   By: Inez Catalina M.D.   On: 03/09/2018 08:46      No results found for: HGBA1C Lab Results  Component Value Date   CREATININE 1.77 (H) 03/10/2018       Scheduled Meds: . Chlorhexidine Gluconate Cloth  6 each Topical Q0600  . enoxaparin (LOVENOX) injection  0.5 mg/kg Subcutaneous Q24H  . feeding supplement (ENSURE ENLIVE)  237 mL Oral TID BM  . feeding supplement (PRO-STAT SUGAR FREE 64)  30 mL Oral BID  . lactose free nutrition  237 mL Oral BID  . metoprolol tartrate  25 mg Oral BID  . multivitamin with minerals  1 tablet Oral Daily  . mupirocin ointment  1 application Nasal BID   Continuous Infusions: . sodium chloride 100 mL/hr at 03/10/18 0006  . ceFEPime (MAXIPIME) IV 2 g (03/10/18 0752)  . sodium chloride    . [START ON 03/11/2018] vancomycin       LOS: 1 day    Time spent: >30 MINS    Reyne Dumas  [facility] Pager [phone]. If 7PM-7AM, please contact night-coverage at [email or web address], password [facility] 03/10/2018, 8:43 AM  LOS: 1 day

## 2018-03-10 NOTE — Progress Notes (Signed)
Rapid Response Event Note  Overview: RN notified RRT of hypotension and tachycardia with patient seen earlier in evening for same problem. Previous visit results in 500cc NS bolus and improved blood pressures. RN had notified MD on call and a second 500cc NS bolus had been given since last visit and a third 500cc bolus had been ordered prior to RRT arrival, but not yet started. RN had performed EKG which showed sinus tachycardia and BBB, possible infarct was stable from previous EKG. RN stated that patient had decreased urinary output through the night and minimal oral intake of fluids.      Initial Focused Assessment: Pt alert in bed. Oriented x4. Denies pain, shortness of breath, confusion, or dizziness.  BP 97/48 (63) HR 121 RR 20 Lungs clear to auscultation.   Interventions: RN started 500cc NS bolus. BP retaken, reading 98/63.   Plan of Care (if not transferred): Finish 500cc NS bolus and retake BP. Call RRT or MD if patient does not improve or declines.   Event Summary:  Arrive at 0546     Bolus started at 0551 BP retaken at 0606          Oona Trammel, Coralee Pesa

## 2018-03-10 NOTE — Progress Notes (Signed)
Rapid Response Event Note  Overview: RN notified of a hypotension, tachycardia, tachypnea, and increased need for oxygen support.  BP 83/55 (64) HR 134 O2 92% on 5L [place] RR: 22 T 98.3     Initial Focused Assessment: Pt A/O x4. Denies shortness of breath, pain, dizziness, or confusion. Patient alert, sitting in bed. Lungs clear to auscultation with diminished based. S1 S2 with no adventitious sounds.  BP: 92/56 (68) (left arm, sitting in bed) HR: 92 O2: 95% 5L  RR: 16  Pt reports taking lasix daily at the nursing facility where she resides. She states she has felt poorly and has not been drinking much for the past 3-4 days. She reports a decrease in urinary output, as well. She denies a history of CHF and none is noted in the chart.   Interventions: 500cc NS bolus ordered per RRT protocol.  Plan of Care (if not transferred): Monitor BP at completion of bolus and notify RRT for hypotension. Call RRT for change in mental status, need for increased oxygenation, tachycardia, hypotension, or other concerns.   Event Summary:  500cc bolus at  2225     Gizzelle Lacomb, Coralee Pesa

## 2018-03-10 NOTE — Progress Notes (Signed)
MD notified of ABG results by this RN.  Dr. Allyson Sabal in to see patient.  Patient in no respiratory distress, is alert and oriented x 4.  O2 sats 92-95% on 5 liters.  Dr. Allyson Sabal placed orders for Bipap at night and with naps.

## 2018-03-10 NOTE — Progress Notes (Signed)
Pharmacy Antibiotic Note  Stephanie [name] is a 58 y.o. female admitted on 03/09/2018 with pneumonia.  Pharmacy has been consulted for vancomycin and cefepime dosing. Zosyn 3.375 gm given x 1 dose in ED so far.  WBC 11.7, Scr 1.98. AF. CXR: Patchy right basilar infiltrate 03/10/2018  SCr 1.98 >>1.77, WBC WNL, AF. MRSA PCR +. SLP to evaluate for aspiration.   Plan: Vancomycin 2500 mg IV loading dose given 1/8 at 1143 am Change vancomycin maintenance dose to 1500 mg IV q24 for est AUC 510 (SCr 1.77, IBW/TBW 0.5) Continue cefepime 2 gm IV q8h F/u renal fxn, WBC, temp, culture data, MRSA PCR vanc levels as needed  Height: 5\' 4"  (162.6 cm) Weight: (!) 415 lb (188.2 kg) IBW/kg (Calculated) : 54.7  Temp (24hrs), Avg:98.4 F (36.9 C), Min:98.1 F (36.7 C), Max:99.4 F (37.4 C)  Recent Labs  Lab 03/09/18 0822 03/09/18 0905 03/09/18 1155 03/09/18 1326 03/10/18 0432  WBC 11.7*  --   --  10.0 9.4  CREATININE 1.98*  --   --  1.82* 1.77*  LATICACIDVEN  --  1.28 1.08  --   --     Estimated Creatinine Clearance: 60.6 mL/min (A) (by C-G formula based on SCr of 1.77 mg/dL (H)).    No Known Allergies  Antimicrobials this admission: 1/8 zosyn x 1 dose 1/8 vanc>> 1/8 cefepime>> Dose adjustments this admission: 1/8 Vancomycin 2500 mg IV Q 48 hrs. AUC: 468 SCr used: 1.98 1/9 dose changed to 1500 q24 AUC 510, SCr 1.77 Microbiology results: 1/8 flu A/B neg 1/8 sputum> ordered 1/8 strep pneumo Uag>ordered 1/8 BCx2>>ngtd 1/8 MRSA PCR> positive 1/8 flu neg From [facility] 11/19 admission: 11/6 Vanc 1500 mg q 12 adjusted to 1250 mg q12  11/4 Vanc 1250mg  q 8 adjusted to 1500mg  q 12  Thank you for allowing pharmacy to be a part of this patient's care.  Eudelia Bunch, Pharm.D [phone] 03/10/2018 2:04 PM

## 2018-03-10 NOTE — Consult Note (Signed)
[facility] Nurse wound consult note Patient receiving care in [employer] [facility].  No family present.  Patient states the wound began when she fell at home some time ago. Reason for Consult: Left abdomen wound Wound type: Trauma injury. Measurement: 4 cm x 3 cm x unknown depth Wound bed: 50% pink granulation, 50% yellow slough. Drainage (amount, consistency, odor) Small amount on existing dressing. Periwound: hypopigmented, but healed. Dressing procedure/placement/frequency: Place a hydrocolloid dressing [name] # 652) over the wound on the left abdomen.  Date the dressing.  Change every 3 days.  This dressing will help promote autolytic debridement of the yellow slough. Monitor the wound area(s) for worsening of condition such as: Signs/symptoms of infection,  Increase in size,  Development of or worsening of odor, Development of pain, or increased pain at the affected locations.  Notify the medical team if any of these develop.  Thank you for the consult.  Discussed plan of care with the patient and bedside nurse.  [name] nurse will not follow at this time.  Please re-consult the [name] team if needed.  Val Riles, RN, MSN, CWOCN, CNS-BC, pager [phone]

## 2018-03-11 ENCOUNTER — Encounter (HOSPITAL_COMMUNITY): Payer: Self-pay | Admitting: Cardiology

## 2018-03-11 DIAGNOSIS — I471 Supraventricular tachycardia: Secondary | ICD-10-CM

## 2018-03-11 LAB — COMPREHENSIVE METABOLIC PANEL
ALT: 7 U/L (ref 0–44)
AST: 17 U/L (ref 15–41)
Albumin: 2.6 g/dL — ABNORMAL LOW (ref 3.5–5.0)
Alkaline Phosphatase: 42 U/L (ref 38–126)
Anion gap: 8 (ref 5–15)
BUN: 74 mg/dL — AB (ref 6–20)
CO2: 28 mmol/L (ref 22–32)
Calcium: 9.1 mg/dL (ref 8.9–10.3)
Chloride: 106 mmol/L (ref 98–111)
Creatinine, Ser: 1.23 mg/dL — ABNORMAL HIGH (ref 0.44–1.00)
GFR calc Af Amer: 57 mL/min — ABNORMAL LOW (ref >60)
GFR calc non Af Amer: 49 mL/min — ABNORMAL LOW (ref >60)
Glucose, Bld: 114 mg/dL — ABNORMAL HIGH (ref 70–99)
Potassium: 5.2 mmol/L — ABNORMAL HIGH (ref 3.5–5.1)
SODIUM: 142 mmol/L (ref 135–145)
Total Bilirubin: 0.5 mg/dL (ref 0.3–1.2)
Total Protein: 6.7 g/dL (ref 6.5–8.1)

## 2018-03-11 LAB — CBC
HCT: 41.2 % (ref 36.0–46.0)
Hemoglobin: 11.6 g/dL — ABNORMAL LOW (ref 12.0–15.0)
MCH: 32 pg (ref 26.0–34.0)
MCHC: 28.2 g/dL — ABNORMAL LOW (ref 30.0–36.0)
MCV: 113.8 fL — ABNORMAL HIGH (ref 80.0–100.0)
Platelets: 186 10*3/uL (ref 150–400)
RBC: 3.62 MIL/uL — ABNORMAL LOW (ref 3.87–5.11)
RDW: 13.7 % (ref 11.5–15.5)
WBC: 7 10*3/uL (ref 4.0–10.5)
nRBC: 0 % (ref 0.0–0.2)

## 2018-03-11 MED ORDER — SODIUM CHLORIDE 0.9% FLUSH
10.0000 mL | Freq: Two times a day (BID) | INTRAVENOUS | Status: DC
  Administered 2018-03-11 – 2018-03-17 (×11): 10 mL

## 2018-03-11 MED ORDER — FUROSEMIDE 10 MG/ML IJ SOLN
40.0000 mg | Freq: Once | INTRAMUSCULAR | Status: AC
  Administered 2018-03-11: 40 mg via INTRAVENOUS
  Filled 2018-03-11: qty 4

## 2018-03-11 MED ORDER — SODIUM CHLORIDE 0.9% FLUSH: 10.0000 mL | INTRAVENOUS | Status: DC | PRN

## 2018-03-11 MED ORDER — TRAZODONE HCL 50 MG PO TABS
50.0000 mg | ORAL_TABLET | Freq: Once | ORAL | Status: AC
  Administered 2018-03-11: 50 mg via ORAL
  Filled 2018-03-11: qty 1

## 2018-03-11 MED ORDER — ORAL CARE MOUTH RINSE
15.0000 mL | Freq: Two times a day (BID) | OROMUCOSAL | Status: DC
  Administered 2018-03-11 – 2018-03-18 (×11): 15 mL via OROMUCOSAL

## 2018-03-11 MED ORDER — SODIUM CHLORIDE 0.9 % IV SOLN: INTRAVENOUS | Status: DC | PRN

## 2018-03-11 MED ORDER — CHLORHEXIDINE GLUCONATE 0.12 % MT SOLN
15.0000 mL | Freq: Two times a day (BID) | OROMUCOSAL | Status: DC
  Administered 2018-03-11 – 2018-03-18 (×15): 15 mL via OROMUCOSAL
  Filled 2018-03-11 (×15): qty 15

## 2018-03-11 MED ORDER — POLYVINYL ALCOHOL 1.4 % OP SOLN
1.0000 [drp] | OPHTHALMIC | Status: DC | PRN
  Administered 2018-03-11: 1 [drp] via OPHTHALMIC
  Filled 2018-03-11: qty 15

## 2018-03-11 MED ORDER — ALBUMIN HUMAN 25 % IV SOLN
50.0000 g | Freq: Four times a day (QID) | INTRAVENOUS | Status: DC
  Administered 2018-03-11: 50 g via INTRAVENOUS
  Filled 2018-03-11 (×2): qty 200

## 2018-03-11 MED ORDER — DILTIAZEM HCL-DEXTROSE 100-5 MG/100ML-% IV SOLN (PREMIX): 5.0000 mg/h | INTRAVENOUS | Status: DC

## 2018-03-11 MED ORDER — METOPROLOL TARTRATE 5 MG/5ML IV SOLN
5.0000 mg | INTRAVENOUS | Status: DC | PRN
  Filled 2018-03-11 (×2): qty 5

## 2018-03-11 NOTE — Progress Notes (Signed)
Pharmacy Antibiotic Note  Stephanie [name] is a 58 y.o. female admitted on 03/09/2018 with pneumonia.  Pharmacy has been consulted for vancomycin and cefepime dosing. Zosyn 3.375 gm given x 1 dose in ED so far.  WBC 11.7, Scr 1.98. AF. CXR: Patchy right basilar infiltrate 03/11/2018  SCr 1.98 >>1.77 > 1.23, WBC WNL, Tmax 99.9. MRSA PCR +. SLP to evaluate for aspiration.   Plan: Vancomycin 2500 mg IV loading dose given 1/8 at 1143 am Change vancomycin maintenance dose to 3000 mg IV q24 for est AUC 518 (SCr 1.23, IBW/TBW 0.5) Continue cefepime 2 gm IV q8h F/u renal fxn, WBC, temp, culture data,  vanc levels as needed  Height: 5\' 4"  (162.6 cm) Weight: (!) 415 lb (188.2 kg) IBW/kg (Calculated) : 54.7  Temp (24hrs), Avg:99 F (37.2 C), Min:98.1 F (36.7 C), Max:99.9 F (37.7 C)  Recent Labs  Lab 03/09/18 0822 03/09/18 0905 03/09/18 1155 03/09/18 1326 03/10/18 0432 03/11/18 0309  WBC 11.7*  --   --  10.0 9.4 7.0  CREATININE 1.98*  --   --  1.82* 1.77* 1.23*  LATICACIDVEN  --  1.28 1.08  --   --   --     Estimated Creatinine Clearance: 87.2 mL/min (A) (by C-G formula based on SCr of 1.23 mg/dL (H)).    No Known Allergies  Antimicrobials this admission: 1/8 zosyn x 1 dose 1/8 vanc>> 1/8 cefepime>> Dose adjustments this admission: 1/8 Vancomycin 2500 mg IV Q 48 hrs. AUC: 468 SCr used: 1.98 1/9 dose changed to 1500 q24 AUC 510, SCr 1.77 1/10 Vanc changed to 2000mg  q24 for AUC 518, with SCr 1.23  Microbiology results: 1/8 flu A/B neg 1/8 sputum> ordered 1/8 strep pneumo Uag>ordered 1/8 BCx2>>ngtd 1/8 MRSA PCR> positive 1/8 flu neg [facility] 11/19 admission: 11/6 Vanc 1500 mg q 12 adjusted to 1250 mg q12  11/4 Vanc 1250mg  q 8 adjusted to 1500mg  q 12  Thank you for allowing pharmacy to be a part of this patient's care.  Eudelia Bunch, Pharm.D [phone] 03/11/2018 12:11 PM

## 2018-03-11 NOTE — Evaluation (Signed)
Clinical/Bedside Swallow Evaluation Patient Details  Name: Stephanie [name] MRN: [id number] Date of Birth: 1961/02/20  Today's Date: 03/11/2018 Time: SLP Start Time (ACUTE ONLY): 1145 SLP Stop Time (ACUTE ONLY): 1205 SLP Time Calculation (min) (ACUTE ONLY): 20 min  Past Medical History:  Past Medical History:  Diagnosis Date  . Hypertension   . Obesity    Past Surgical History: History reviewed. No pertinent surgical history. HPI:  58 year old female admitted 03/09/18 from [facility] with nasal congestion, cough, malaise. PMH: sepsis, cellulitis, acute hypoxic respiratory failure, HCAP, AKI, rhabdo, HTN, obesity. CXR  = RLL infiltrate   Assessment / Plan / Recommendation Clinical Impression  Pt reports no history of swallowing difficulty. She tolerates a regular diet and thin liquids at her facility. Oral motor exam is unremarkable, although pt does have a frontal lisp. Pt accepted trials of thin liquid, puree, and solid consistencies. No obvious oral difficuly or overt s/s aspiration noted on any consistency tested. Pt did indicate that she feels PNA is recurrent, although her H&P does not reflect this. If PNA is recurrent, recommend consideration of FEES to rule out silent aspiration. This may be done as an outpatient at [facility], or pt's facility may be able to arrange this on site. No further ST intervention recommended at this time. Please reconsult if needs arise.    SLP Visit Diagnosis: Dysphagia, unspecified (R13.10)    Aspiration Risk  Mild aspiration risk    Diet Recommendation Regular;Thin liquid   Liquid Administration via: Cup;Straw Medication Administration: Whole meds with liquid Supervision: Patient able to self feed Compensations: Minimize environmental distractions;Slow rate;Small sips/bites Postural Changes: Seated upright at 90 degrees    Other  Recommendations Oral Care Recommendations: Oral care QID   Follow up Recommendations None          Prognosis  Prognosis for Safe Diet Advancement: Good      Swallow Study   General Date of Onset: 03/09/18 HPI: 58 year old female admitted 03/09/18 from [facility] with nasal congestion, cough, malaise. PMH: sepsis, cellulitis, acute hypoxic respiratory failure, HCAP, AKI, rhabdo, HTN, obesity. CXR  = RLL infiltrate Type of Study: Bedside Swallow Evaluation Previous Swallow Assessment: none Diet Prior to this Study: Regular;Thin liquids Temperature Spikes Noted: No Respiratory Status: Nasal cannula History of Recent Intubation: No Behavior/Cognition: Alert;Cooperative;Pleasant mood Oral Cavity Assessment: Within Functional Limits Oral Care Completed by SLP: No Oral Cavity - Dentition: Adequate natural dentition Vision: Functional for self-feeding Self-Feeding Abilities: Able to feed self Patient Positioning: Partially reclined Baseline Vocal Quality: Normal Volitional Cough: Strong Volitional Swallow: Able to elicit    Oral/Motor/Sensory Function Overall Oral Motor/Sensory Function: Within functional limits   Ice Chips Ice chips: Not tested   Thin Liquid Thin Liquid: Within functional limits Presentation: Straw    Nectar Thick Nectar Thick Liquid: Not tested   Honey Thick Honey Thick Liquid: Not tested   Puree Puree: Within functional limits Presentation: Spoon   Solid     Solid: Within functional limits Presentation: [place] [facility], [facility], [place] Speech Language Pathologist [phone]  Stephanie [name] 03/11/2018,12:15 PM

## 2018-03-11 NOTE — Progress Notes (Signed)
SLP Cancellation Note  Patient Details Name: Stephanie [name] MRN: [id number] DOB: [birth year]   Cancelled treatment:       Reason Eval/Treat Not Completed: Patient at procedure or test/unavailable. IV team currently working with pt. Will continue efforts. RN informed.  Celia B. Qu[facility] O[facility] CCC-SLP Speech Language Pathologist [phone]  Shonna Chock 03/11/2018, 10:15 AM

## 2018-03-11 NOTE — Consult Note (Addendum)
Cardiology Consultation:   Patient ID: Stephanie [name] MRN: [id number]; DOB: March 03, 1960  Admit date: 03/09/2018 Date of Consult: 03/11/2018  Primary Care Provider: Patient, No Pcp Per Primary Cardiologist: New to [place] Primary Electrophysiologist:  None    Patient Profile:   Stephanie [name] is a 58 y.o. female with a hx of super morbid obesity, obesity hyperventilation syndrome, diastolic dysfunction,  HTN and recent prolonged hospital admission for sepsis secondary to lower extremity cellulitis further complicated by the development of HCAP in November 2019, who has been readmitted for recurrent PNA and AKI. She is being seen today for the evaluation of tachycardia at the request of Dr. Nevada Crane, Internal Medicine.   History of Present Illness:   Stephanie [name] has no prior documented cardiac history. Her cardiac risk factors include HTN and obesity. Body mass index is 71.23 kg/m. When she was admitted this past Nov for sepsis, cellulitis and PNA, she had an echo that showed normal systolic function with EF of 60-65%, normal wall motion and G2DD. No valvular abnormalties were noted. Per notes from prior hospitalization she was here from 11/2-11/22. Blood cultures were negative. She was treated w/ antibiotics and ultimately discharged to a SNF.   She presented back to [facility] ED on 03/09/18 with CC of cough, nasal congestion and generalized malaise. CXR was concerning for PNA w/ right basal infiltrate but pt afebrile.  WBC ct 11.7. CMP was notable for AKI w/ Scr at 1.98. Prior baseline <1.0 (0.59 12/2017).  Normal lactate, normal BNP. BP was admitted by IM and started on IV antibiotics.   Cardiology now consulted for intermittent tachycardia up to the 140s. Her admit EKG on 03/10/18 showed that she was in NSR at time of initial presentation. She is currently on scheduled metoprolol, 25 mg BID. She is currently in NSR on tele with rates in the 80s. Earlier BPs were not accurate. Discussed w/ RN. An arterial  line was placed and SBPs in the 140s.   Her renal function has improved. SCr down from 1.98>>1.82>>1.77>>1.23. Mild hyperkalemia at 5.2.   She states that she felt palpitations earlier this morning when her HR increased but this is her first time experiencing palpitations. No CP or increased dyspnea. She reports diarrhea earlier this morning x 1.   Past Medical History:  Diagnosis Date  . Hypertension   . Obesity     History reviewed. No pertinent surgical history.   Home Medications:  Prior to Admission medications   Medication Sig Start Date End Date Taking? Authorizing Provider  acetaminophen (TYLENOL) 500 MG tablet Take 500-1,000 mg by mouth every 6 (six) hours as needed for headache or pain.   Yes [provider]  Amino Acids-Protein Hydrolys (FEEDING SUPPLEMENT, PRO-STAT SUGAR FREE 64,) LIQD Take 30 mLs by mouth 2 (two) times daily.   Yes [provider]  amLODipine (NORVASC) 10 MG tablet Take 1 tablet (10 mg total) by mouth daily. 01/18/18  Yes Mody, Ulice Bold, MD  bisacodyl (DULCOLAX) 10 MG suppository Place 10 mg rectally every 4 (four) hours as needed for moderate constipation.   Yes [provider]  feeding supplement, ENSURE ENLIVE, (ENSURE ENLIVE) LIQD Take 237 mLs by mouth 3 (three) times daily between meals. Patient taking differently: Take 237 mLs by mouth 2 (two) times daily as needed. (Lunch/Dinner) if only ingests 50% of food 01/17/18  Yes Mody, Sital, MD  furosemide (LASIX) 40 MG tablet Take 1 tablet (40 mg total) by mouth 2 (two) times daily. 01/17/18  Yes Mody,  Sital, MD  ipratropium-albuterol (DUONEB) 0.5-2.5 (3) MG/3ML SOLN Take 3 mLs by nebulization every 4 (four) hours as needed. 01/17/18  Yes Mody, Ulice Bold, MD  lactose free nutrition (BOOST) LIQD Take 237 mLs by mouth 2 (two) times daily.   Yes [provider]  lisinopril (PRINIVIL,ZESTRIL) 20 MG tablet Take 1 tablet (20 mg total) by mouth daily. 01/18/18  Yes Mody, Ulice Bold, MD    magnesium hydroxide (MILK OF MAGNESIA) 400 MG/5ML suspension Take 30 mLs by mouth every 12 (twelve) hours as needed for mild constipation.   Yes [provider]  metoprolol tartrate (LOPRESSOR) 25 MG tablet Take 1 tablet (25 mg total) by mouth 2 (two) times daily. 01/17/18  Yes Mody, Ulice Bold, MD  Multiple Vitamins-Minerals (CERTA-VITE PO) Take 1 tablet by mouth daily. Certa-vite w/ anti-oxidants for wound healing   Yes [provider]  polyethylene glycol (MIRALAX / GLYCOLAX) packet Take 17 g by mouth daily as needed for mild constipation. 01/17/18  Yes Mody, Ulice Bold, MD  protein supplement (RESOURCE BENEPROTEIN) 6 g POWD Take 1 scoop by mouth daily.   Yes [provider]  enoxaparin (LOVENOX) 40 MG/0.4ML injection Inject 0.4 mLs (40 mg total) into the skin daily. Patient not taking: Reported on 03/09/2018 01/21/18   Vaughan Basta, MD  mineral oil-hydrophilic petrolatum (AQUAPHOR) ointment Apply to feet daily for 90 days. Patient not taking: Reported on 03/09/2018 02/17/18   Marzetta Board, DPM  NONFORMULARY OR COMPOUNDED ITEM [place] Solution - soak feet for 15-20 minutes weekly for 4 weeks. Patient not taking: Reported on 03/09/2018 02/17/18   Marzetta Board, DPM    Inpatient Medications: Scheduled Meds: . chlorhexidine  15 mL Mouth Rinse BID  . Chlorhexidine Gluconate Cloth  6 each Topical Q0600  . enoxaparin (LOVENOX) injection  0.5 mg/kg Subcutaneous Q24H  . feeding supplement (ENSURE ENLIVE)  237 mL Oral TID BM  . feeding supplement (PRO-STAT SUGAR FREE 64)  30 mL Oral BID  . lactose free nutrition  237 mL Oral BID  . mouth rinse  15 mL Mouth Rinse q12n4p  . metoprolol tartrate  25 mg Oral BID  . multivitamin with minerals  1 tablet Oral Daily  . mupirocin ointment  1 application Nasal BID  . sodium chloride flush  10-40 mL Intracatheter Q12H   Continuous Infusions: . sodium chloride    . albumin human 50 g (03/11/18 0841)  . ceFEPime  (MAXIPIME) IV Stopped (03/11/18 0051)  . diltiazem (CARDIZEM) infusion Stopped (03/11/18 0650)  . vancomycin Stopped (03/10/18 1817)   PRN Meds: Place/Maintain arterial line **AND** sodium chloride, acetaminophen, bisacodyl, ipratropium-albuterol, magnesium hydroxide, metoprolol tartrate, polyethylene glycol, sodium chloride flush  Allergies:   No Known Allergies  Social History:   Social History   Socioeconomic History  . Marital status: Single    Spouse name: Not on file  . Number of children: Not on file  . Years of education: Not on file  . Highest education level: Not on file  Occupational History  . Not on file  Social Needs  . Financial resource strain: Not on file  . Food insecurity:    Worry: Not on file    Inability: Not on file  . Transportation needs:    Medical: Not on file    Non-medical: Not on file  Tobacco Use  . Smoking status: Never Smoker  . Smokeless tobacco: Never Used  Substance and Sexual Activity  . Alcohol use: Not Currently  . Drug use: Not Currently  . Sexual  activity: Not Currently  Lifestyle  . Physical activity:    Days per week: Not on file    Minutes per session: Not on file  . Stress: Not on file  Relationships  . Social connections:    Talks on phone: Not on file    Gets together: Not on file    Attends religious service: Not on file    Active member of club or organization: Not on file    Attends meetings of clubs or organizations: Not on file    Relationship status: Not on file  . Intimate partner violence:    Fear of current or ex partner: Not on file    Emotionally abused: Not on file    Physically abused: Not on file    Forced sexual activity: Not on file  Other Topics Concern  . Not on file  Social History Narrative  . Not on file    Family History:   Family History  Problem Relation Age of Onset  . Hypertension Mother      ROS:  Please see the history of present illness.  All other ROS reviewed and negative.      Physical Exam/Data:   Vitals:   03/11/18 0600 03/11/18 0651 03/11/18 0700 03/11/18 0800  BP: 98/64  (!) 85/40   Pulse: (!) 130 85 87   Resp: (!) 29 (!) 24 (!) 22   Temp:    98.7 F (37.1 C)  TempSrc:    Oral  SpO2: 93% 93% 93%   Weight:      Height:        Intake/Output Summary (Last 24 hours) at 03/11/2018 1111 Last data filed at 03/11/2018 0600 Gross per 24 hour  Intake 620 ml  Output 1900 ml  Net -1280 ml   Last 3 Weights 03/10/2018 03/09/2018 01/12/2018  Weight (lbs) 415 lb 434 lb 509 lb 4.2 oz  Weight (kg) 188.243 kg 196.861 kg 231 kg     Body mass index is 71.23 kg/m.  General:  [place] morbidly obese AAF, no distress HEENT: bilateral scleral injection  Lymph: no adenopathy Neck: obese neck  Endocrine:  No thryomegaly Vascular: No carotid bruits; FA pulses 2+ bilaterally without bruits  Cardiac:  RRR, distant heart sounds due to body habitus Lungs:  Distant breath sounds due to body habitus  Abd: obese abdomen, non tender Ext: obese extremities Musculoskeletal:  Obese extremities BUE and BLE strength normal and equal Skin: warm and dry  Neuro:  CNs 2-12 intact, no focal abnormalities noted Psych:  Normal affect   EKG:  The EKG was personally reviewed and demonstrates:  NSR  Telemetry:  Telemetry was personally reviewed and demonstrates:  transient SVT with rates into the 140s. Currently NSR. HR in the 80s.    Relevant CV Studies: 2D Echo 12/2017 Study Conclusions  - Left ventricle: The cavity size was normal. Systolic function was   normal. The estimated ejection fraction was in the range of 60%   to 65%. Wall motion was normal; there were no regional wall   motion abnormalities. Features are consistent with a pseudonormal   left ventricular filling pattern, with concomitant abnormal   relaxation and increased filling pressure (grade 2 diastolic   dysfunction). - Left atrium: The atrium was at the upper limits of normal in   size. - Right ventricle:  Poorly visualized. The cavity size was mildly   dilated. Wall thickness was normal. Systolic function was grossly   normal. - Pulmonary arteries: Systolic pressure  could not be accurately   estimated.   Laboratory Data:  Chemistry Recent Labs  Lab 03/09/18 0822 03/09/18 1326 03/10/18 0432 03/11/18 0309  NA 137  --  140 142  K 5.1  --  5.0 5.2*  CL 92*  --  97* 106  CO2 33*  --  32 28  GLUCOSE 108*  --  109* 114*  BUN 100*  --  100* 74*  CREATININE 1.98* 1.82* 1.77* 1.23*  CALCIUM 9.6  --  9.0 9.1  GFRNONAA 28* 31* 32* 49*  GFRAA 32* 35* 37* 57*  ANIONGAP 12  --  11 8    Recent Labs  Lab 03/09/18 0822 03/11/18 0309  PROT 7.6 6.7  ALBUMIN 3.2* 2.6*  AST 19 17  ALT 11 7  ALKPHOS 57 42  BILITOT 0.8 0.5   Hematology Recent Labs  Lab 03/09/18 1326 03/10/18 0432 03/11/18 0309  WBC 10.0 9.4 7.0  RBC 4.04 3.72* 3.62*  HGB 12.9 11.8* 11.6*  HCT 44.7 41.3 41.2  MCV 110.6* 111.0* 113.8*  MCH 31.9 31.7 32.0  MCHC 28.9* 28.6* 28.2*  RDW 14.0 14.0 13.7  PLT 214 236 186   Cardiac Enzymes Recent Labs  Lab 03/10/18 0940  TROPONINI 0.04*    Recent Labs  Lab 03/09/18 0900  TROPIPOC 0.01    BNP Recent Labs  Lab 03/09/18 0822  BNP 50.0    DDimer No results for input(s): DDIMER in the last 168 hours.  Radiology/Studies:  Dg Chest 2 View  Result Date: 03/09/2018 CLINICAL DATA:  Cough and congestion for 2 days EXAM: CHEST - 2 VIEW COMPARISON:  01/21/2018 FINDINGS: Cardiac shadow remains enlarged. Patchy right basilar infiltrate is noted projecting in the right middle and lower lobes on the lateral projection. Left lung is clear. No bony abnormality is noted. Previously seen left-sided PICC line has been removed. IMPRESSION: Patchy right basilar infiltrate. Followup PA and lateral chest X-ray is recommended in 3-4 weeks following trial of antibiotic therapy to ensure resolution and exclude underlying malignancy. Electronically Signed   By: Inez Catalina M.D.   On:  03/09/2018 08:46    Assessment and Plan:   Stephanie [name] is a 58 y.o. female with a hx of super morbid obesity, obesity hyperventilation syndrome, diastolic dysfunction,  HTN and recent prolonged hospital admission for sepsis secondary to lower extremity cellulitis further complicated by the development of HCAP in November 2019, who has been readmitted for recurrent PNA and AKI. She is being seen today for the evaluation of tachycardia at the request of Dr. Nevada Crane, Internal Medicine.   1. Recurrent PNA: on antibiotics. Management per IM.   2. SVT: intermitted SVT noted on tele with rates into the 140s. Pt had palpitations this am, but brief. Pt reports < 1 min. This is in the setting of infection/ PNA. TSH WNL. Morning labs showed mild hyperkalemia at 5.2, but pt reports she just had a BM (diarrhea). Will need to monitor K closely. Continue treatment of PNA. Hopefully frequency of arrhthymias will decrease as PNA resolves. Continue metoprolol for rate control. I'v discussed blood pressure readings with  RN. She reports earlier documented pressures were not accurate. An arterial line was placed and SBPs have been in the 140s. She had a recent echo in November that showed normal LVEF. No plans to repeat.   3. Super Morbid Obesity: Body mass index is 71.23 kg/m.  4. Obesity Hyperventilation Syndrome: on chronic supplemental O2.   5. HTN: SBPs in the 140s. Continue  metoprolol. May need further titration if further issues w/ tachycardia. Home ACE on hold due to AKI.   6. AKI: improved. Admit SCr 1.98. Prior baseline <1.0 (0.59 12/2017). Down to 1.23 today.    For questions or updates, please contact [place] Please consult [email or web address] for contact info under     Signed, Lyda Jester, PA-C  03/11/2018 11:11 AM  Patient seen, examined. Available data reviewed. Agree with findings, assessment, and plan as outlined by Lyda Jester, PA-C.  The patient is independently interviewed  and examined.  She is an ill-appearing, morbidly obese woman in no distress.  HEENT: Sclera are injected, otherwise unremarkable.  JVP: Unable to visualize because of body habitus.  Lungs are coarse bilaterally with diffuse rhonchi.  CV is tachycardic and regular with no murmur or gallop.  Heart sounds are distant.  Abdomen is soft, obese, nontender.  Extremities demonstrate trace pretibial edema and chronic stasis changes.  Telemetry is reviewed and demonstrates sinus tachycardia with a heart rate of 125 bpm.  There is a short run of supraventricular tachycardia with a heart rate of approximately 140 bpm.  In summary, this is a patient with super morbid obesity, body mass index 71, who was hospitalized from a skilled nursing facility with healthcare acquired pneumonia and respiratory failure.  Her tachycardia is likely physiologic because of her acute illness.  I do not see evidence of any malignant arrhythmias, wide-complex tachycardia, or bradycardic events.  Would continue to treat her underlying illness.  She is noted to have normal LV systolic function by recent echocardiogram.  Other medical problems per the primary team.  No other recommendations at this time.  Please call with any questions or concerns.  Sherren Mocha, M.D. 03/11/2018 4:28 PM

## 2018-03-11 NOTE — Progress Notes (Addendum)
PROGRESS NOTE  Stephanie [name] QHU:[id number] DOB: 1960/10/04 DOA: 03/09/2018 PCP: Patient, No Pcp Per  HPI/Recap of past 53 hours:  58 year old female with a history of hypertension, morbid obesity, recent history of sepsis due to cellulitis,/acute hypoxic respiratory failure,  , who presented to the ED with malaise, cough, congestion, chest x-ray of notable for right basilar infiltrate, patient also found to have acute kidney injury with a creatinine of 1.98. Patient also had a rapid response last night and was noted to be hypotensive tachycardic with decreased urine output. Patient is now admitted for evaluation and treatment.  03/11/2018: Patient seen and examined at bedside.  Hypotension with inaccurate blood pressure readings.  Arterial line placed in this morning by RT.  Reports dyspnea with minimal movement in bed.  Patient has been mostly nonambulatory for 3 months.  Ongoing physical therapy prior to admission.  Assessment/Plan: Principal Problem:   HAP (hospital-acquired pneumonia) Active Problems:   Hypertension   Morbid obesity with BMI of 70 and over, adult ([place])   AKI (acute kidney injury) ([place])   Chronic respiratory failure with hypoxia (H[facility])   HCAP, poa Continue cefepime and IV vancomycin Independently reviewed chest x-ray which revealed right middle lobe infiltrates Afebrile with no leukocytosis Obtain procalcitonin in the morning With a fever curve Obtain CBC in the morning Continue nebs Maintain O2 saturation greater than 92% Influenza panel negative  Hypotension Inaccurate blood pressures Arterial line placed in for accurate readings Received 2 doses of IV albumin Maintain map greater than 65 Hold home antihypertensives  Supraventricular tachycardia Heart rate persistently elevated greater than 120 Cardiology has been consulted and following Also possibly driven by acute lung physiology IV metoprolol and Cardizem drip in place On Lopressor p.o. 25 mg twice  daily Continue to closely monitor on telemetry  Acute on chronic hypoxic respiratory failure complicated by obesity hypoventilation syndrome Continue O2 supplementation to maintain O2 saturation greater than 92% Strongly recommend weight loss outpatient  AKI Creatinine improved back to her baseline Presented with creatinine of 1.98 Creatinine today 1.2 Avoid nephrotoxic agents and hypotension Monitor urine output Repeat BMP in the morning  Super morbid obesity with BMI 71 Strongly recommended weight loss outpatient Follow-up with PCP  Rectal bleeding with negative FOBT Noted by nursing Patient's stool was brown Likely hemorrhoidal bleeding Patient states she's never had a colonoscopy Hemoglobin has been stable Likely not a candidate for conscious sedation for colonoscopy in the setting of respiratory failure, will request GI to see only if the patient is decompensating GI bleeding  Suspected dysphagia Speech therapist evaluated and recommended regular diet with thin liquids Mild risk for aspiration   DVT prophylaxsis  Lovenox  Code Status: full code     Family Communication:  None at bedside  Disposition Plan:  SNF possibly 2 to 3 days when hemodynamically stable.     Consultants:  none  Procedures:  none    Objective: Vitals:   03/11/18 1300 03/11/18 1330 03/11/18 1340 03/11/18 1350  BP:    (!) 152/81  Pulse:      Resp: (!) 21 (!) 29 (!) 27 16  Temp:      TempSrc:      SpO2:      Weight:      Height:        Intake/Output Summary (Last 24 hours) at 03/11/2018 1607 Last data filed at 03/11/2018 1354 Gross per 24 hour  Intake 561.22 ml  Output 1900 ml  Net -1338.78 ml   [employer]  03/09/18 0721 03/10/18 1319  Weight: (!) 196.9 kg (!) 188.2 kg    Exam:  . General: 58 y.o. year-old female morbidly obese in no acute distress.  Alert and oriented x3. . Cardiovascular: Regular rate and rhythm with no rubs or gallops.  No  thyromegaly or JVD noted.   Marland Kitchen Respiratory: Diffuse rales bilaterally.  Poor inspiratory effort. . Abdomen: Morbidly obese soft nontender nondistended with normal bowel sounds x4 quadrants. . Musculoskeletal: Trace lower extremity edema. 2/4 pulses in all 4 extremities. Marland Kitchen Psychiatry: Mood is appropriate for condition and setting   Data Reviewed: CBC: Recent Labs  Lab 03/09/18 0822 03/09/18 1326 03/10/18 0432 03/11/18 0309  WBC 11.7* 10.0 9.4 7.0  NEUTROABS 7.7  --  5.3  --   HGB 12.6 12.9 11.8* 11.6*  HCT 43.5 44.7 41.3 41.2  MCV 111.3* 110.6* 111.0* 113.8*  PLT 213 214 236 433   Basic Metabolic Panel: Recent Labs  Lab 03/09/18 0822 03/09/18 1326 03/10/18 0432 03/11/18 0309  NA 137  --  140 142  K 5.1  --  5.0 5.2*  CL 92*  --  97* 106  CO2 33*  --  32 28  GLUCOSE 108*  --  109* 114*  BUN 100*  --  100* 74*  CREATININE 1.98* 1.82* 1.77* 1.23*  CALCIUM 9.6  --  9.0 9.1   GFR: Estimated Creatinine Clearance: 87.2 mL/min (A) (by C-G formula based on SCr of 1.23 mg/dL (H)). Liver Function Tests: Recent Labs  Lab 03/09/18 0822 03/11/18 0309  AST 19 17  ALT 11 7  ALKPHOS 57 42  BILITOT 0.8 0.5  PROT 7.6 6.7  ALBUMIN 3.2* 2.6*   Recent Labs  Lab 03/09/18 0822  LIPASE 47   No results for input(s): AMMONIA in the last 168 hours. Coagulation Profile: No results for input(s): INR, PROTIME in the last 168 hours. Cardiac Enzymes: Recent Labs  Lab 03/10/18 0940  TROPONINI 0.04*   BNP (last 3 results) No results for input(s): PROBNP in the last 8760 hours. HbA1C: Recent Labs    03/10/18 0940  HGBA1C 6.5*   CBG: Recent Labs  Lab 03/10/18 2024  GLUCAP 101*   Lipid Profile: No results for input(s): CHOL, HDL, LDLCALC, TRIG, CHOLHDL, LDLDIRECT in the last 72 hours. Thyroid Function Tests: Recent Labs    03/10/18 0940  TSH 0.781   Anemia Panel: No results for input(s): VITAMINB12, FOLATE, FERRITIN, TIBC, IRON, RETICCTPCT in the last 72 hours. Urine  analysis:    Component Value Date/Time   COLORURINE YELLOW (A) 01/17/2018 1239   APPEARANCEUR CLEAR (A) 01/17/2018 1239   LABSPEC 1.012 01/17/2018 1239   PHURINE 6.0 01/17/2018 1239   GLUCOSEU NEGATIVE 01/17/2018 1239   HGBUR SMALL (A) 01/17/2018 1239   BILIRUBINUR NEGATIVE 01/17/2018 1239   KETONESUR NEGATIVE 01/17/2018 1239   PROTEINUR NEGATIVE 01/17/2018 1239   NITRITE POSITIVE (A) 01/17/2018 1239   LEUKOCYTESUR NEGATIVE 01/17/2018 1239   Sepsis Labs: @LABRCNTIP (procalcitonin:4,lacticidven:4)  ) Recent Results (from the past 240 hour(s))  Blood culture (routine x 2)     Status: None (Preliminary result)   Collection Time: 03/09/18  1:16 PM  Result Value Ref Range Status   Specimen Description   Final    BLOOD RIGHT HAND Performed at [facility], [place] [address], [place], [place] [address]    Special Requests   Final    BOTTLES DRAWN AEROBIC AND ANAEROBIC Blood Culture adequate volume Performed at [facility]  [address] [place], [place] [address]    Culture   Final    NO GROWTH 2 DAYS Performed at [facility], [place] [address] [place], [place] [address]    Report Status PENDING  Incomplete  MRSA PCR Screening     Status: Abnormal   Collection Time: 03/09/18  1:23 PM  Result Value Ref Range Status   MRSA by PCR POSITIVE (A) NEGATIVE Final    Comment:        The GeneXpert MRSA Assay (FDA approved for NASAL specimens only), is one component of a comprehensive MRSA colonization surveillance program. It is not intended to diagnose MRSA infection nor to guide or monitor treatment for MRSA infections. RESULT CALLED TO, READ BACK BY AND VERIFIED WITH: Letta Moynahan [id number] @ [id number] Performed at [facility] [address], [place], [place] [address]   Blood culture (routine x 2)     Status: None (Preliminary result)   Collection Time: 03/09/18  1:26 PM  Result Value Ref Range Status    Specimen Description   Final    BLOOD LEFT HAND Performed at [facility] [address], [place], [place] [address]    Special Requests   Final    BOTTLES DRAWN AEROBIC AND ANAEROBIC Blood Culture adequate volume Performed at [facility] [address] [place], [place] [address]    Culture   Final    NO GROWTH 2 DAYS Performed at [facility] [address] [place], [name] [address]    Report Status PENDING  Incomplete      Studies: No results found.  Scheduled Meds: . chlorhexidine  15 mL Mouth Rinse BID  . Chlorhexidine Gluconate Cloth  6 each Topical Q0600  . enoxaparin (LOVENOX) injection  0.5 mg/kg Subcutaneous Q24H  . feeding supplement (ENSURE ENLIVE)  237 mL Oral TID BM  . feeding supplement (PRO-STAT SUGAR FREE 64)  30 mL Oral BID  . lactose free nutrition  237 mL Oral BID  . mouth rinse  15 mL Mouth Rinse q12n4p  . metoprolol tartrate  25 mg Oral BID  . multivitamin with minerals  1 tablet Oral Daily  . mupirocin ointment  1 application Nasal BID  . sodium chloride flush  10-40 mL Intracatheter Q12H    Continuous Infusions: . sodium chloride    . ceFEPime (MAXIPIME) IV 2 g (03/11/18 1548)  . diltiazem (CARDIZEM) infusion Stopped (03/11/18 0650)  . vancomycin Stopped (03/10/18 1817)     LOS: 2 days     Kayleen Memos, MD [facility] Pager [phone]  If 7PM-7AM, please contact night-coverage [email or web address] Password [email or web address] 03/11/2018, 4:07 PM

## 2018-03-11 NOTE — Progress Notes (Signed)
OT Cancellation Note  Patient Details Name: Stephanie [name] MRN: [id number] DOB: May 20, 1960   Cancelled Treatment:    Reason Eval/Treat Not Completed: Medical issues which prohibited therapy.  Not medically ready per RN.    [place] 03/11/2018, 9:00 AM  Lesle Chris, OTR/L Acute Rehabilitation Services [phone] [facility] pager [phone] office 03/11/2018

## 2018-03-11 NOTE — Progress Notes (Signed)
PT Cancellation Note  Patient Details Name: Shalene Gallen MRN: [id number] DOB: 1960-05-27   Cancelled Treatment:    Reason Eval/Treat Not Completed: Medical issues which prohibited therapy. Patient not ready per RN.   Claretha Cooper 03/11/2018, 8:36 AM [place] Pager [phone] Office [phone]

## 2018-03-12 DIAGNOSIS — J189 Pneumonia, unspecified organism: Principal | ICD-10-CM

## 2018-03-12 DIAGNOSIS — Y95 Nosocomial condition: Secondary | ICD-10-CM

## 2018-03-12 LAB — EXPECTORATED SPUTUM ASSESSMENT W GRAM STAIN, RFLX TO RESP C

## 2018-03-12 LAB — BASIC METABOLIC PANEL
Anion gap: 7 (ref 5–15)
BUN: 51 mg/dL — ABNORMAL HIGH (ref 6–20)
CO2: 30 mmol/L (ref 22–32)
Calcium: 9.3 mg/dL (ref 8.9–10.3)
Chloride: 107 mmol/L (ref 98–111)
Creatinine, Ser: 0.98 mg/dL (ref 0.44–1.00)
GFR calc Af Amer: >60 mL/min (ref >60)
GFR calc non Af Amer: >60 mL/min (ref >60)
Glucose, Bld: 95 mg/dL (ref 70–99)
Potassium: 4.4 mmol/L (ref 3.5–5.1)
Sodium: 144 mmol/L (ref 135–145)

## 2018-03-12 LAB — CBC
HCT: 39.3 % (ref 36.0–46.0)
Hemoglobin: 11.1 g/dL — ABNORMAL LOW (ref 12.0–15.0)
MCH: 31.7 pg (ref 26.0–34.0)
MCHC: 28.2 g/dL — ABNORMAL LOW (ref 30.0–36.0)
MCV: 112.3 fL — AB (ref 80.0–100.0)
Platelets: 196 10*3/uL (ref 150–400)
RBC: 3.5 MIL/uL — ABNORMAL LOW (ref 3.87–5.11)
RDW: 13.5 % (ref 11.5–15.5)
WBC: 6.6 10*3/uL (ref 4.0–10.5)
nRBC: 0 % (ref 0.0–0.2)

## 2018-03-12 LAB — PROCALCITONIN: Procalcitonin: 0.19 ng/mL

## 2018-03-12 LAB — EXPECTORATED SPUTUM ASSESSMENT W REFEX TO RESP CULTURE

## 2018-03-12 MED ORDER — GUAIFENESIN ER 600 MG PO TB12
1200.0000 mg | ORAL_TABLET | Freq: Two times a day (BID) | ORAL | Status: DC
  Administered 2018-03-12 – 2018-03-18 (×13): 1200 mg via ORAL
  Filled 2018-03-12 (×13): qty 2

## 2018-03-12 MED ORDER — SODIUM CHLORIDE 3 % IN NEBU
4.0000 mL | INHALATION_SOLUTION | Freq: Three times a day (TID) | RESPIRATORY_TRACT | Status: DC
  Administered 2018-03-12 – 2018-03-13 (×4): 4 mL via RESPIRATORY_TRACT
  Filled 2018-03-12 (×8): qty 4

## 2018-03-12 MED ORDER — BOOST / RESOURCE BREEZE PO LIQD CUSTOM
1.0000 | Freq: Two times a day (BID) | ORAL | Status: DC
  Administered 2018-03-12 – 2018-03-18 (×8): 1 via ORAL

## 2018-03-12 MED ORDER — IPRATROPIUM-ALBUTEROL 0.5-2.5 (3) MG/3ML IN SOLN
3.0000 mL | Freq: Four times a day (QID) | RESPIRATORY_TRACT | Status: DC
  Administered 2018-03-12 – 2018-03-14 (×9): 3 mL via RESPIRATORY_TRACT
  Filled 2018-03-12 (×9): qty 3

## 2018-03-12 MED ORDER — VANCOMYCIN HCL 10 G IV SOLR
2000.0000 mg | INTRAVENOUS | Status: DC
  Administered 2018-03-12: 2000 mg via INTRAVENOUS
  Filled 2018-03-12: qty 2000

## 2018-03-12 NOTE — Progress Notes (Signed)
PROGRESS NOTE  Stephanie [name] XTG:[id number] DOB: August 11, 1960 DOA: 03/09/2018 PCP: Patient, No Pcp Per  HPI/Recap of past 53 hours:  58 year old female with a history of hypertension, morbid obesity, recent history of sepsis due to cellulitis,/acute hypoxic respiratory failure,  , who presented to the ED with malaise, cough, congestion, chest x-ray of notable for right basilar infiltrate, patient also found to have acute kidney injury with a creatinine of 1.98. Patient also had a rapid response last night and was noted to be hypotensive tachycardic with decreased urine output. Patient is now admitted for evaluation and treatment.  03/11/2018: Patient seen and examined at bedside.  Hypotension with inaccurate blood pressure readings.  Arterial line placed in this morning by RT.  Reports dyspnea with minimal movement in bed.  Patient has been mostly nonambulatory for 3 months.  Ongoing physical therapy prior to admission.  03/12/18: No acute events overnight. Still dyspneic with movement. On 2L O2 at baseline and currently on 4L. Wean off O2 supplement as tolerated. No chest pain or palpitation.  Assessment/Plan: Principal Problem:   HAP (hospital-acquired pneumonia) Active Problems:   Hypertension   Morbid obesity with BMI of 70 and over, adult ([place])   AKI (acute kidney injury) ([place])   Chronic respiratory failure with hypoxia (HCC)   HCAP, poa Continue cefepime and IV vancomycin Independently reviewed chest x-ray which revealed right middle lobe infiltrates Afebrile with no leukocytosis procalcitonin 0.19 C/w monitor fever curve Continue nebs Maintain O2 saturation greater than 92% Influenza panel negative CBC am  Resolving Hypotension Inaccurate blood pressures Arterial line placed in for accurate readings Received 2 doses of IV albumin Maintain map greater than 65 Hold home antihypertensives  Resolving Supraventricular tachycardia Heart rate persistently elevated greater than  120 Cardiology has been consulted and following Also possibly driven by acute lung physiology IV metoprolol and Cardizem drip in place On Lopressor p.o. 25 mg twice daily Continue to closely monitor on telemetry  Acute on chronic hypoxic respiratory failure complicated by obesity hypoventilation syndrome Continue O2 supplementation to maintain O2 saturation greater than 92% Strongly recommend weight loss outpatient  Resolved AKI Creatinine improved back to her baseline Presented with creatinine of 1.98 Creatinine today 0.9 back to her baseline Avoid nephrotoxic agents and hypotension Monitor urine output Repeat BMP in the morning  Super morbid obesity with BMI 71 Strongly recommended weight loss outpatient Follow-up with PCP  Rectal bleeding with negative FOBT Noted by nursing Patient's stool was brown Likely hemorrhoidal bleeding Patient states she's never had a colonoscopy Hemoglobin has been stable Likely not a candidate for conscious sedation for colonoscopy in the setting of respiratory failure, will request GI to see only if the patient is decompensating GI bleeding  Suspected dysphagia Speech therapist evaluated and recommended regular diet with thin liquids Mild risk for aspiration   DVT prophylaxsis  Lovenox  Code Status: full code     Family Communication:  None at bedside  Disposition Plan:  SNF possibly 1-2 days when hemodynamically stable. Possibly Monday.     Consultants:  none  Procedures:  none    Objective: Vitals:   03/12/18 0700 03/12/18 0800 03/12/18 0832 03/12/18 1015  BP: 96/61 (!) 105/59  134/63  Pulse: 87 85  87  Resp: 18 (!) 22  19  Temp:  97.9 F (36.6 C)    TempSrc:  Oral    SpO2: 97% 97% 99% 97%  Weight:      Height:        Intake/Output Summary (  Last 24 hours) at 03/12/2018 1128 Last data filed at 03/12/2018 1000 Gross per 24 hour  Intake 1225.62 ml  Output 1800 ml  Net -574.38 ml   Filed Weights    03/09/18 0721 03/10/18 1319  Weight: (!) 196.9 kg (!) 188.2 kg    Exam:  . General: 58 y.o. year-old female Morbid obesity. NAD A&O x 3 . Cardiovascular: RRR no rubs or gallops. NO JVD or thyromegaly . Respiratory: Mild rales b/l no wheezes. Poor inspiratory efforts . Abdomen: Morbidly obese soft nontender nondistended with normal bowel sounds x4 quadrants. . Musculoskeletal: Trace lower extremity edema. 2/4 pulses in all 4 extremities. Marland Kitchen Psychiatry: Mood is appropriate for condition and setting   Data Reviewed: CBC: Recent Labs  Lab 03/09/18 0822 03/09/18 1326 03/10/18 0432 03/11/18 0309 03/12/18 0324  WBC 11.7* 10.0 9.4 7.0 6.6  NEUTROABS 7.7  --  5.3  --   --   HGB 12.6 12.9 11.8* 11.6* 11.1*  HCT 43.5 44.7 41.3 41.2 39.3  MCV 111.3* 110.6* 111.0* 113.8* 112.3*  PLT 213 214 236 186 626   Basic Metabolic Panel: Recent Labs  Lab 03/09/18 0822 03/09/18 1326 03/10/18 0432 03/11/18 0309 03/12/18 0324  NA 137  --  140 142 144  K 5.1  --  5.0 5.2* 4.4  CL 92*  --  97* 106 107  CO2 33*  --  32 28 30  GLUCOSE 108*  --  109* 114* 95  BUN 100*  --  100* 74* 51*  CREATININE 1.98* 1.82* 1.77* 1.23* 0.98  CALCIUM 9.6  --  9.0 9.1 9.3   GFR: Estimated Creatinine Clearance: 109.4 mL/min (by C-G formula based on SCr of 0.98 mg/dL). Liver Function Tests: Recent Labs  Lab 03/09/18 0822 03/11/18 0309  AST 19 17  ALT 11 7  ALKPHOS 57 42  BILITOT 0.8 0.5  PROT 7.6 6.7  ALBUMIN 3.2* 2.6*   Recent Labs  Lab 03/09/18 0822  LIPASE 47   No results for input(s): AMMONIA in the last 168 hours. Coagulation Profile: No results for input(s): INR, PROTIME in the last 168 hours. Cardiac Enzymes: Recent Labs  Lab 03/10/18 0940  TROPONINI 0.04*   BNP (last 3 results) No results for input(s): PROBNP in the last 8760 hours. HbA1C: Recent Labs    03/10/18 0940  HGBA1C 6.5*   CBG: Recent Labs  Lab 03/10/18 2024  GLUCAP 101*   Lipid Profile: No results for  input(s): CHOL, HDL, LDLCALC, TRIG, CHOLHDL, LDLDIRECT in the last 72 hours. Thyroid Function Tests: Recent Labs    03/10/18 0940  TSH 0.781   Anemia Panel: No results for input(s): VITAMINB12, FOLATE, FERRITIN, TIBC, IRON, RETICCTPCT in the last 72 hours. Urine analysis:    Component Value Date/Time   COLORURINE YELLOW (A) 01/17/2018 1239   APPEARANCEUR CLEAR (A) 01/17/2018 1239   LABSPEC 1.012 01/17/2018 1239   PHURINE 6.0 01/17/2018 1239   GLUCOSEU NEGATIVE 01/17/2018 1239   HGBUR SMALL (A) 01/17/2018 1239   BILIRUBINUR NEGATIVE 01/17/2018 1239   KETONESUR NEGATIVE 01/17/2018 1239   PROTEINUR NEGATIVE 01/17/2018 1239   NITRITE POSITIVE (A) 01/17/2018 1239   LEUKOCYTESUR NEGATIVE 01/17/2018 1239   Sepsis Labs: @LABRCNTIP (procalcitonin:4,lacticidven:4)  ) Recent Results (from the past 240 hour(s))  Blood culture (routine x 2)     Status: None (Preliminary result)   Collection Time: 03/09/18  1:16 PM  Result Value Ref Range Status   Specimen Description   Final    BLOOD RIGHT HAND Performed at  [facility], [place] [address] [place], [place] [address]    Special Requests   Final    BOTTLES DRAWN AEROBIC AND ANAEROBIC Blood Culture adequate volume Performed at [facility] [address], [place], [place] [address]    Culture   Final    NO GROWTH 3 DAYS Performed at [facility], [place] [address], [place], [place] [address]    Report Status PENDING  Incomplete  MRSA PCR Screening     Status: Abnormal   Collection Time: 03/09/18  1:23 PM  Result Value Ref Range Status   MRSA by PCR POSITIVE (A) NEGATIVE Final    Comment:        The GeneXpert MRSA Assay (FDA approved for NASAL specimens only), is one component of a comprehensive MRSA colonization surveillance program. It is not intended to diagnose MRSA infection nor to guide or monitor treatment for MRSA infections. RESULT CALLED TO, READ BACK BY AND VERIFIED  WITH: Letta Moynahan [id number] @ [address] Performed at [facility] [address] [place], [place] [address]   Blood culture (routine x 2)     Status: None (Preliminary result)   Collection Time: 03/09/18  1:26 PM  Result Value Ref Range Status   Specimen Description   Final    BLOOD LEFT HAND Performed at [facility] [address], [place], [place] [address]    Special Requests   Final    BOTTLES DRAWN AEROBIC AND ANAEROBIC Blood Culture adequate volume Performed at [facility] [address] [place], [place] [address]    Culture   Final    NO GROWTH 3 DAYS Performed at [facility], [place] [address], [place], [place] [address]    Report Status PENDING  Incomplete      Studies: No results found.  Scheduled Meds: . chlorhexidine  15 mL Mouth Rinse BID  . Chlorhexidine Gluconate Cloth  6 each Topical Q0600  . enoxaparin (LOVENOX) injection  0.5 mg/kg Subcutaneous Q24H  . feeding supplement (ENSURE ENLIVE)  237 mL Oral TID BM  . feeding supplement (PRO-STAT SUGAR FREE 64)  30 mL Oral BID  . guaiFENesin  1,200 mg Oral BID  . ipratropium-albuterol  3 mL Nebulization Q6H  . lactose free nutrition  237 mL Oral BID  . mouth rinse  15 mL Mouth Rinse q12n4p  . metoprolol tartrate  25 mg Oral BID  . multivitamin with minerals  1 tablet Oral Daily  . mupirocin ointment  1 application Nasal BID  . sodium chloride flush  10-40 mL Intracatheter Q12H  . sodium chloride HYPERTONIC  4 mL Nebulization TID    Continuous Infusions: . sodium chloride    . ceFEPime (MAXIPIME) IV Stopped (03/12/18 0846)  . diltiazem (CARDIZEM) infusion Stopped (03/11/18 0650)  . vancomycin       LOS: 3 days     Kayleen Memos, MD [facility] Pager [phone]  If 7PM-7AM, please contact night-coverage [email or web address] Password [facility] 03/12/2018, 11:28 AM

## 2018-03-12 NOTE — Progress Notes (Signed)
Patient off BIPAP 

## 2018-03-12 NOTE — Progress Notes (Signed)
Arterial line redressed due to arterial line not reading. No complications

## 2018-03-12 NOTE — Evaluation (Signed)
Physical Therapy Evaluation Patient Details Name: Stephanie [name] MRN: [id number] DOB: 1960/08/07 Today's Date: 03/12/2018   History of Present Illness  pt admitted with HAP.  PMH:  HTN  Clinical Impression  This 58 year old female was admitted from [facility] with HAP. She needed total A for adls and was working on sitting EOB and LE therex with PT . Pt noted to have profound weakness in all extremities. Pt would benefit from follow up rehab at SNF level to maximize IND and safety.  Follow Up Recommendations SNF    Equipment Recommendations  None recommended by PT    Recommendations for Other Services       Precautions / Restrictions Precautions Precautions: Fall Precaution Comments: profound weakness  Restrictions Weight Bearing Restrictions: No      Mobility  Bed Mobility               General bed mobility comments: NT 2* pt size, weakness, fatigue and presence of A-line and fecal tube  Transfers                    Ambulation/Gait                Stairs            Wheelchair Mobility    Modified Rankin (Stroke Patients Only)       Balance                                             Pertinent Vitals/Pain Pain Assessment: Faces Faces Pain Scale: Hurts even more Pain Location: R toes hypersensitive to touch Pain Descriptors / Indicators: Sore Pain Intervention(s): Limited activity within patient's tolerance;Monitored during session    Home Living Family/patient expects to be discharged to:: Skilled nursing facility                 Additional Comments: was at [facility] since Nov. Working on sitting side of bed    Prior Function Level of Independence: Needs assistance   Gait / Transfers Assistance Needed: only sitting side of bed  ADL's / Homemaking Assistance Needed: assist for adls bed level        Hand Dominance   Dominant Hand: Right    Extremity/Trunk Assessment   Upper  Extremity Assessment Upper Extremity Assessment: Defer to OT evaluation RUE Deficits / Details: R handed; A-line in place.  Shoulder 2-/5 strength.  elbow 3-/5; wrist NT; hand 3/5.  AAROM tolerated to 90.  Bil weakness, tremulous arms R moreso than L LUE Deficits / Details: L shoulder 2+/5; elbow 3/5, hand 3-/5. AAROM to 90 tolerated    Lower Extremity Assessment Lower Extremity Assessment: RLE deficits/detail;LLE deficits/detail RLE Deficits / Details: 2- strength with AAROM ltd by body habitus LLE Deficits / Details: 1/5 strength with trace amount of pt participation with basic AAROM       Communication   Communication: No difficulties  Cognition Arousal/Alertness: Awake/alert Behavior During Therapy: WFL for tasks assessed/performed Overall Cognitive Status: No family/caregiver present to determine baseline cognitive functioning                                 General Comments: poor historian for timelines      General Comments      Exercises General Exercises - Lower  Extremity Ankle Circles/Pumps: AAROM;10 reps;Both;Supine Quad Sets: AAROM;5 reps;Supine;Both Heel Slides: AAROM;Both;Supine;10 reps Hip ABduction/ADduction: AAROM;Both;10 reps;Supine Other Exercises Other Exercises: AAROM to bil shoulders Other Exercises: provided squeeze ball to RN. Pt was getting A-line out and will have him give her ball after period of rest   Assessment/Plan    PT Assessment Patient needs continued PT services  PT Problem List Decreased strength;Decreased range of motion;Decreased activity tolerance;Decreased balance;Decreased mobility;Decreased knowledge of use of DME;Obesity;Pain       PT Treatment Interventions DME instruction;Functional mobility training;Therapeutic activities;Therapeutic exercise;Balance training;Patient/family education    PT Goals (Current goals can be found in the Care Plan section)  Acute Rehab PT Goals Patient Stated Goal: regain strength;  get back to where she was PT Goal Formulation: With patient Time For Goal Achievement: 03/26/18 Potential to Achieve Goals: Fair    Frequency Min 2X/week   Barriers to discharge        Co-evaluation PT/OT/SLP Co-Evaluation/Treatment: Yes Reason for Co-Treatment: For patient/therapist safety PT goals addressed during session: Mobility/safety with mobility OT goals addressed during session: ADL's and self-care       AM-PAC PT "6 Clicks" Mobility  Outcome Measure Help needed turning from your back to your side while in a flat bed without using bedrails?: Total Help needed moving from lying on your back to sitting on the side of a flat bed without using bedrails?: Total Help needed moving to and from a bed to a chair (including a wheelchair)?: Total Help needed standing up from a chair using your arms (e.g., wheelchair or bedside chair)?: Total Help needed to walk in hospital room?: Total Help needed climbing 3-5 steps with a railing? : Total 6 Click Score: 6    End of Session   Activity Tolerance: Patient limited by fatigue Patient left: in bed;with call bell/phone within reach;with nursing/sitter in room Nurse Communication: Mobility status;Need for lift equipment PT Visit Diagnosis: Muscle weakness (generalized) (M62.81)    Time: 0569-7948 PT Time Calculation (min) (ACUTE ONLY): 30 min   Charges:   PT Evaluation $PT Eval Moderate Complexity: [place] Pager [phone] Office [phone]   [facility] 03/12/2018, 4:39 PM

## 2018-03-12 NOTE — Evaluation (Signed)
Occupational Therapy Evaluation Patient Details Name: Stephanie [name] MRN: [id number] DOB: 1960/07/25 Today's Date: 03/12/2018    History of Present Illness pt admitted with HAP.  PMH:  HTN   Clinical Impression   This 58 year old female was admitted from [facility] with HAP. She needed total A for adls and was working on sitting EOB. Pt profound weakness in all extremities. Will follow in acute setting with the goals listed below, concentrating on regaining UE strength for UB adls and working on rolling for bed level adls.     Follow Up Recommendations  SNF    Equipment Recommendations  None recommended by OT    Recommendations for Other Services       Precautions / Restrictions Precautions Precautions: Fall Precaution Comments: profound weakness  Restrictions Weight Bearing Restrictions: No      Mobility Bed Mobility               General bed mobility comments: Need A x 3+ to roll  Transfers                      Balance                                           ADL either performed or assessed with clinical judgement   ADL Overall ADL's : Needs assistance/impaired Eating/Feeding: Minimal assistance Eating/Feeding Details (indicate cue type and reason): pt reports she isn't eating Grooming: Minimal assistance;Bed level Grooming Details (indicate cue type and reason): able to wash face with set up Upper Body Bathing: Maximal assistance;Bed level                             General ADL Comments: total A for all other ADLs, +3 to roll for adls. Pt too weak to assist     Vision         Perception     Praxis      Pertinent Vitals/Pain Pain Assessment: Faces Faces Pain Scale: Hurts even more Pain Location: R toes hypersensitive to touch Pain Intervention(s): Limited activity within patient's tolerance     Hand Dominance Right   Extremity/Trunk Assessment Upper Extremity Assessment Upper Extremity  Assessment: Generalized weakness;RUE deficits/detail;LUE deficits/detail RUE Deficits / Details: R handed; A-line in place.  Shoulder 2-/5 strength.  elbow 3-/5; wrist NT; hand 3/5.  AAROM tolerated to 90.  Bil weakness, tremulous arms R moreso than L LUE Deficits / Details: L shoulder 2+/5; elbow 3/5, hand 3-/5. AAROM to 90 tolerated           Communication Communication Communication: No difficulties   Cognition Arousal/Alertness: Awake/alert Behavior During Therapy: WFL for tasks assessed/performed Overall Cognitive Status: No family/caregiver present to determine baseline cognitive functioning                                 General Comments: poor historian for timelines   General Comments       Exercises Exercises: Other exercises Other Exercises Other Exercises: AAROM to bil shoulders Other Exercises: provided squeeze ball to RN. Pt was getting A-line out and will have him give her ball after period of rest   Shoulder Instructions      Home Living Family/patient expects to be discharged to:: Skilled nursing  facility                                 Additional Comments: was at [facility] since Nov. Working on sitting side of bed      Prior Functioning/Environment Level of Independence: Needs assistance  Gait / Transfers Assistance Needed: only sitting side of bed ADL's / Homemaking Assistance Needed: assist for adls bed level            OT Problem List: Decreased strength;Decreased activity tolerance;Decreased range of motion;Decreased cognition;Pain;Impaired UE functional use(balance, likely, NT)      OT Treatment/Interventions: Self-care/ADL training;Therapeutic exercise;DME and/or AE instruction;Therapeutic activities;Balance training;Patient/family education;Cognitive remediation/compensation    OT Goals(Current goals can be found in the care plan section) Acute Rehab OT Goals Patient Stated Goal: regain strength; get back  to where she was OT Goal Formulation: With patient Time For Goal Achievement: 03/26/18 Potential to Achieve Goals: Fair ADL Goals Pt Will Perform Eating: with set-up;bed level Pt Will Perform Grooming: with set-up;bed level Pt Will Perform Upper Body Bathing: with min assist;bed level Pt Will Perform Upper Body Dressing: with mod assist;bed level Additional ADL Goal #1: pt will roll to bil sides with max +2 assist for adls  OT Frequency: Min 2X/week   Barriers to D/C:            Co-evaluation              AM-PAC OT "6 Clicks" Daily Activity     Outcome Measure Help from another person eating meals?: A Little Help from another person taking care of personal grooming?: A Little Help from another person toileting, which includes using toliet, bedpan, or urinal?: Total Help from another person bathing (including washing, rinsing, drying)?: Total Help from another person to put on and taking off regular upper body clothing?: Total Help from another person to put on and taking off regular lower body clothing?: Total 6 Click Score: 10   End of Session    Activity Tolerance: Patient tolerated treatment well Patient left: in bed;with call bell/phone within reach;with bed alarm set  OT Visit Diagnosis: Muscle weakness (generalized) (M62.81)                Time: 5465-0354 OT Time Calculation (min): 30 min Charges:  OT General Charges $OT Visit: 1 Visit 1 Moderate Eval Lesle Chris, OTR/L Acute Rehabilitation Services [phone] [facility] pager [phone] office 03/12/2018  [place] 03/12/2018, 4:18 PM

## 2018-03-12 NOTE — Progress Notes (Addendum)
Arterial  Line removed by this RN. No distress noted. Petroleum dressing, guaze, tape used. Pressure held for 15 minutes. R radial pulse+2 after removal and dressing placed

## 2018-03-12 NOTE — Plan of Care (Signed)
  Problem: Elimination: Goal: Will not experience complications related to urinary retention Outcome: Progressing   Problem: Safety: Goal: Ability to remain free from injury will improve Outcome: Progressing   Problem: Nutrition: Goal: Adequate nutrition will be maintained Outcome: Progressing

## 2018-03-12 NOTE — Progress Notes (Signed)
03/12/2018  0800  Notified charge RN that she will need to assess and maintain the daily work for the patient's A-line. Per charge RN will do. Charge RN did go in patient room and made the daily adjustments for A-line.

## 2018-03-13 ENCOUNTER — Inpatient Hospital Stay (HOSPITAL_COMMUNITY): Payer: BLUE CROSS/BLUE SHIELD

## 2018-03-13 LAB — CBC
HCT: 41 % (ref 36.0–46.0)
Hemoglobin: 11.4 g/dL — ABNORMAL LOW (ref 12.0–15.0)
MCH: 31.1 pg (ref 26.0–34.0)
MCHC: 27.8 g/dL — ABNORMAL LOW (ref 30.0–36.0)
MCV: 112 fL — ABNORMAL HIGH (ref 80.0–100.0)
Platelets: 223 10*3/uL (ref 150–400)
RBC: 3.66 MIL/uL — ABNORMAL LOW (ref 3.87–5.11)
RDW: 13.4 % (ref 11.5–15.5)
WBC: 7.3 10*3/uL (ref 4.0–10.5)
nRBC: 0 % (ref 0.0–0.2)

## 2018-03-13 LAB — BASIC METABOLIC PANEL
ANION GAP: 6 (ref 5–15)
BUN: 42 mg/dL — ABNORMAL HIGH (ref 6–20)
CALCIUM: 9.6 mg/dL (ref 8.9–10.3)
CO2: 32 mmol/L (ref 22–32)
Chloride: 109 mmol/L (ref 98–111)
Creatinine, Ser: 0.84 mg/dL (ref 0.44–1.00)
GFR calc Af Amer: >60 mL/min (ref >60)
Glucose, Bld: 108 mg/dL — ABNORMAL HIGH (ref 70–99)
Potassium: 4.8 mmol/L (ref 3.5–5.1)
Sodium: 147 mmol/L — ABNORMAL HIGH (ref 135–145)

## 2018-03-13 LAB — TROPONIN I: Troponin I: 0.03 ng/mL (ref <0.03)

## 2018-03-13 MED ORDER — FUROSEMIDE 40 MG PO TABS
40.0000 mg | ORAL_TABLET | Freq: Two times a day (BID) | ORAL | Status: DC
  Administered 2018-03-13 – 2018-03-18 (×10): 40 mg via ORAL
  Filled 2018-03-13 (×10): qty 1

## 2018-03-13 MED ORDER — FUROSEMIDE 40 MG PO TABS: 40.0000 mg | ORAL_TABLET | Freq: Two times a day (BID) | ORAL | Status: DC

## 2018-03-13 NOTE — Progress Notes (Signed)
Pt with an occasional jump in her heart rate up to 180 and then in less than couple of seconds, heart rate quickly drops down to normal in 70's to 80's. Call received from [facility] reporting a run of [place]. EKG done and Dr. [place] Crane made aware. See new order by MD. Pt with no s/s of abnormal rhythm. Will cont to monitor. VWilliams,RN.

## 2018-03-13 NOTE — Progress Notes (Signed)
Notified by tele monitoring that patient's HR had increased. Went to bedside to assess. Patient asleep, aroused easily. Denied pain, shortness of breath, and/or palpitations. Removed BIPAP and placed on 4L O2 via , HR immediately came back down to baseline in the 80's.

## 2018-03-13 NOTE — Progress Notes (Signed)
PROGRESS NOTE  Stephanie [name] RSW:[id number] DOB: 1961/02/19 DOA: 03/09/2018 PCP: Patient, No Pcp Per  HPI/Recap of past 11 hours:  58 year old female with a history of hypertension, morbid obesity, recent history of sepsis due to cellulitis,/acute hypoxic respiratory failure,  , who presented to the ED with malaise, cough, congestion, chest x-ray of notable for right basilar infiltrate, patient also found to have acute kidney injury with a creatinine of 1.98. Patient also had a rapid response last night and was noted to be hypotensive tachycardic with decreased urine output. Patient is now admitted for evaluation and treatment.  03/11/2018: Patient seen and examined at bedside.  Hypotension with inaccurate blood pressure readings.  Arterial line placed in this morning by RT.  Reports dyspnea with minimal movement in bed.  Patient has been mostly nonambulatory for 3 months.  Ongoing physical therapy prior to admission.  03/12/18: No acute events overnight. Still dyspneic with movement. On 2L O2 at baseline and currently on 4L. Wean off O2 supplement as tolerated. No chest pain or palpitation.  03/13/18:Seen and examined at bedside. Reports poor appetite w poor oral intake. No nausea. Encouraged to increase protein calorie intake. Chronic hypoxia.  Assessment/Plan: Principal Problem:   HAP (hospital-acquired pneumonia) Active Problems:   Hypertension   Morbid obesity with BMI of 70 and over, adult ([place])   AKI (acute kidney injury) ([place])   Chronic respiratory failure with hypoxia ([place])   HCAP, poa Completed 5 days of cefepime and IV vancomycin Independently reviewed chest x-ray which revealed right middle lobe infiltrates Afebrile with no leukocytosis Influenza A&B negative procalcitonin 0.19 C/w nebs and O2 supplement  Acute on chronic hypoxic respiratory failure complicated by obesity hypoventilation syndrome Continue O2 supplementation to maintain O2 saturation greater than  92% Strongly recommend weight loss outpatient  Chronic diastolic CHF with grade 2 diastolic dysfunction Resume p.o. Lasix 40 mg twice daily Continue home cardiac medications on metoprolol and now on Lasix Strict I's and O's and daily weight  Resolving Hypotension Inaccurate blood pressures Arterial line placed in for accurate readings; dc on 03/12/18 Received 2 doses of IV albumin Maintain map greater than 65 C/ to Hold home amlodipine 10 mg daily and lisinopril 20 mg daily  Resolved Supraventricular tachycardia Initially heart rate was elevated greater than 120 Cardiology has been consulted and following Also possibly driven by acute lung physiology Continue Lopressor p.o. 25 mg twice daily IV metoprolol as needed for heart rate greater than 120 Close monitoring on telemetry  Resolved AKI Creatinine improved back to her baseline Presented with creatinine of 1.98 Creatinine today 0.8 back to her baseline Avoid nephrotoxic agents and hypotension Monitor urine output Repeat BMP in the morning  Super morbid obesity with BMI 71 Strongly recommended weight loss outpatient Follow-up with PCP  Rectal bleeding with negative FOBT Noted by nursing Patient's stool was brown Likely hemorrhoidal bleeding Patient states she's never had a colonoscopy Hemoglobin has been stable Likely not a candidate for conscious sedation for colonoscopy in the setting of respiratory failure, will request GI to see only if the patient is decompensating GI bleeding  Suspected dysphagia Speech therapist evaluated and recommended regular diet with thin liquids Mild risk for aspiration   DVT prophylaxsis  Lovenox  Code Status: full code     Family Communication:  None at bedside  Disposition Plan:  SNF possibly 1-2 days when hemodynamically stable. Possibly Monday.     Consultants:  none  Procedures:  none    Objective: Vitals:   03/13/18  0900 03/13/18 1030 03/13/18 1156  03/13/18 1348  BP: (!) 143/75     Pulse: 92     Resp: (!) 23     Temp:   99.1 F (37.3 C)   TempSrc:   Axillary   SpO2: 97% 100%  98%  Weight:      Height:        Intake/Output Summary (Last 24 hours) at 03/13/2018 1358 Last data filed at 03/13/2018 1343 Gross per 24 hour  Intake 2288 ml  Output 900 ml  Net 1388 ml   Filed Weights   03/09/18 0721 03/10/18 1319  Weight: (!) 196.9 kg (!) 188.2 kg    Exam:  . General: 58 y.o. year-old female morbid obesity.  No acute distress.  Alert and oriented x3. . Cardiovascular: Regular rate and rhythm with no rubs or gallops.  No JVD or thyromegaly . Respiratory: Mild rales bilaterally with no wheezes.  Poor inspiratory effort. . Abdomen: Morbidly obese soft nontender nondistended with normal bowel sounds x4 quadrants. . Musculoskeletal: Trace lower extremity edema. 2/4 pulses in all 4 extremities. Marland Kitchen Psychiatry: Mood is appropriate for condition and setting   Data Reviewed: CBC: Recent Labs  Lab 03/09/18 0822 03/09/18 1326 03/10/18 0432 03/11/18 0309 03/12/18 0324 03/13/18 0243  WBC 11.7* 10.0 9.4 7.0 6.6 7.3  NEUTROABS 7.7  --  5.3  --   --   --   HGB 12.6 12.9 11.8* 11.6* 11.1* 11.4*  HCT 43.5 44.7 41.3 41.2 39.3 41.0  MCV 111.3* 110.6* 111.0* 113.8* 112.3* 112.0*  PLT 213 214 236 186 196 629   Basic Metabolic Panel: Recent Labs  Lab 03/09/18 0822 03/09/18 1326 03/10/18 0432 03/11/18 0309 03/12/18 0324 03/13/18 0243  NA 137  --  140 142 144 147*  K 5.1  --  5.0 5.2* 4.4 4.8  CL 92*  --  97* 106 107 109  CO2 33*  --  32 28 30 32  GLUCOSE 108*  --  109* 114* 95 108*  BUN 100*  --  100* 74* 51* 42*  CREATININE 1.98* 1.82* 1.77* 1.23* 0.98 0.84  CALCIUM 9.6  --  9.0 9.1 9.3 9.6   GFR: Estimated Creatinine Clearance: 127.6 mL/min (by C-G formula based on SCr of 0.84 mg/dL). Liver Function Tests: Recent Labs  Lab 03/09/18 0822 03/11/18 0309  AST 19 17  ALT 11 7  ALKPHOS 57 42  BILITOT 0.8 0.5  PROT 7.6 6.7   ALBUMIN 3.2* 2.6*   Recent Labs  Lab 03/09/18 0822  LIPASE 47   No results for input(s): AMMONIA in the last 168 hours. Coagulation Profile: No results for input(s): INR, PROTIME in the last 168 hours. Cardiac Enzymes: Recent Labs  Lab 03/10/18 0940  TROPONINI 0.04*   BNP (last 3 results) No results for input(s): PROBNP in the last 8760 hours. HbA1C: No results for input(s): HGBA1C in the last 72 hours. CBG: Recent Labs  Lab 03/10/18 2024  GLUCAP 101*   Lipid Profile: No results for input(s): CHOL, HDL, LDLCALC, TRIG, CHOLHDL, LDLDIRECT in the last 72 hours. Thyroid Function Tests: No results for input(s): TSH, T4TOTAL, FREET4, T3FREE, THYROIDAB in the last 72 hours. Anemia Panel: No results for input(s): VITAMINB12, FOLATE, FERRITIN, TIBC, IRON, RETICCTPCT in the last 72 hours. Urine analysis:    Component Value Date/Time   COLORURINE YELLOW (A) 01/17/2018 1239   APPEARANCEUR CLEAR (A) 01/17/2018 1239   LABSPEC 1.012 01/17/2018 1239   PHURINE 6.0 01/17/2018 1239   GLUCOSEU NEGATIVE  01/17/2018 1239   HGBUR SMALL (A) 01/17/2018 1239   BILIRUBINUR NEGATIVE 01/17/2018 1239   KETONESUR NEGATIVE 01/17/2018 1239   PROTEINUR NEGATIVE 01/17/2018 1239   NITRITE POSITIVE (A) 01/17/2018 1239   LEUKOCYTESUR NEGATIVE 01/17/2018 1239   Sepsis Labs: @LABRCNTIP (procalcitonin:4,lacticidven:4)  ) Recent Results (from the past 240 hour(s))  Culture, sputum-assessment     Status: None   Collection Time: 03/09/18 12:43 PM  Result Value Ref Range Status   Specimen Description SPUTUM  Final   Special Requests NONE  Final   Sputum evaluation   Final    THIS SPECIMEN IS ACCEPTABLE FOR SPUTUM CULTURE Performed at [facility], [place] [address] [place], [place] [address]    Report Status 03/12/2018 FINAL  Final  Culture, respiratory     Status: None (Preliminary result)   Collection Time: 03/09/18 12:43 PM  Result Value Ref Range Status   Specimen Description    Final    SPUTUM Performed at [facility] [address] [place], [place] [address]    Special Requests   Final    NONE Reflexed from [id number] Performed at [facility], [place] [address], [place], [place] [address]    Gram Stain   Final    MODERATE WBC PRESENT, PREDOMINANTLY PMN RARE SQUAMOUS EPITHELIAL CELLS PRESENT FEW GRAM POSITIVE COCCI FEW GRAM NEGATIVE RODS Performed at [facility], [place] [address] [place], [place] [address]    Culture CULTURE REINCUBATED FOR BETTER GROWTH  Final   Report Status PENDING  Incomplete  Blood culture (routine x 2)     Status: None (Preliminary result)   Collection Time: 03/09/18  1:16 PM  Result Value Ref Range Status   Specimen Description   Final    BLOOD RIGHT HAND Performed at [facility] [address], [place], [place] [address]    Special Requests   Final    BOTTLES DRAWN AEROBIC AND ANAEROBIC Blood Culture adequate volume Performed at [facility] [address], [place], [place] [address]    Culture   Final    NO GROWTH 4 DAYS Performed at [facility], [place] [address] [place], [place] [address]    Report Status PENDING  Incomplete  MRSA PCR Screening     Status: Abnormal   Collection Time: 03/09/18  1:23 PM  Result Value Ref Range Status   MRSA by PCR POSITIVE (A) NEGATIVE Final    Comment:        The GeneXpert MRSA Assay (FDA approved for NASAL specimens only), is one component of a comprehensive MRSA colonization surveillance program. It is not intended to diagnose MRSA infection nor to guide or monitor treatment for MRSA infections. RESULT CALLED TO, READ BACK BY AND VERIFIED WITH: Letta Moynahan [id number] @ [address] Performed at [facility] [address], [place], [place] [address]   Blood culture (routine x 2)     Status: None (Preliminary result)   Collection Time: 03/09/18  1:26 PM  Result  Value Ref Range Status   Specimen Description   Final    BLOOD LEFT HAND Performed at [facility] [address], [place], [place] [address]    Special Requests   Final    BOTTLES DRAWN AEROBIC AND ANAEROBIC Blood Culture adequate volume Performed at [facility] [address] [place], [place] [address]    Culture   Final    NO GROWTH  4 DAYS Performed at [facility], [place] [address], [place], [place] [address]    Report Status PENDING  Incomplete      Studies: Dg Chest Port 1 View  Result Date: 03/13/2018 CLINICAL DATA:  Cough and congestion EXAM: PORTABLE CHEST 1 VIEW COMPARISON:  Chest radiograph 03/09/2018 FINDINGS: Patient is rotated to the left. Stable cardiomegaly. Similar-appearing bilateral mid and lower lung consolidative opacities. No definite pleural effusion or pneumothorax. IMPRESSION: Similar-appearing bilateral mid and lower lung opacities which may represent atelectasis or infection. Followup PA and lateral chest X-ray is recommended in 3-4 weeks following trial of antibiotic therapy to ensure resolution and exclude underlying malignancy. Electronically Signed   By: Lovey Newcomer M.D.   On: 03/13/2018 09:10    Scheduled Meds: . chlorhexidine  15 mL Mouth Rinse BID  . Chlorhexidine Gluconate Cloth  6 each Topical Q0600  . enoxaparin (LOVENOX) injection  0.5 mg/kg Subcutaneous Q24H  . feeding supplement  1 Container Oral BID BM  . feeding supplement (ENSURE ENLIVE)  237 mL Oral TID BM  . feeding supplement (PRO-STAT SUGAR FREE 64)  30 mL Oral BID  . furosemide  40 mg Oral BID  . guaiFENesin  1,200 mg Oral BID  . ipratropium-albuterol  3 mL Nebulization Q6H  . mouth rinse  15 mL Mouth Rinse q12n4p  . metoprolol tartrate  25 mg Oral BID  . multivitamin with minerals  1 tablet Oral Daily  . mupirocin ointment  1 application Nasal BID  . sodium chloride flush  10-40 mL Intracatheter Q12H  . sodium chloride HYPERTONIC  4 mL  Nebulization TID    Continuous Infusions: . sodium chloride 10 mL/hr at 03/12/18 1600  . ceFEPime (MAXIPIME) IV Stopped (03/13/18 0955)  . diltiazem (CARDIZEM) infusion Stopped (03/11/18 0650)     LOS: 4 days     Kayleen Memos, MD [facility] Pager [phone]  If 7PM-7AM, please contact night-coverage [email or web address] Password [email or web address] 03/13/2018, 1:58 PM

## 2018-03-14 LAB — CBC
HCT: 39.5 % (ref 36.0–46.0)
Hemoglobin: 11 g/dL — ABNORMAL LOW (ref 12.0–15.0)
MCH: 31.8 pg (ref 26.0–34.0)
MCHC: 27.8 g/dL — ABNORMAL LOW (ref 30.0–36.0)
MCV: 114.2 fL — AB (ref 80.0–100.0)
Platelets: 219 10*3/uL (ref 150–400)
RBC: 3.46 MIL/uL — AB (ref 3.87–5.11)
RDW: 13.7 % (ref 11.5–15.5)
WBC: 9.8 10*3/uL (ref 4.0–10.5)
nRBC: 0 % (ref 0.0–0.2)

## 2018-03-14 LAB — BASIC METABOLIC PANEL
ANION GAP: 7 (ref 5–15)
BUN: 38 mg/dL — ABNORMAL HIGH (ref 6–20)
CO2: 31 mmol/L (ref 22–32)
Calcium: 9.5 mg/dL (ref 8.9–10.3)
Chloride: 108 mmol/L (ref 98–111)
Creatinine, Ser: 0.91 mg/dL (ref 0.44–1.00)
GFR calc Af Amer: >60 mL/min (ref >60)
GFR calc non Af Amer: >60 mL/min (ref >60)
GLUCOSE: 103 mg/dL — AB (ref 70–99)
Potassium: 4.8 mmol/L (ref 3.5–5.1)
Sodium: 146 mmol/L — ABNORMAL HIGH (ref 135–145)

## 2018-03-14 LAB — CULTURE, BLOOD (ROUTINE X 2)
CULTURE: NO GROWTH
CULTURE: NO GROWTH
SPECIAL REQUESTS: ADEQUATE
Special Requests: ADEQUATE

## 2018-03-14 LAB — CULTURE, RESPIRATORY W GRAM STAIN

## 2018-03-14 LAB — CULTURE, RESPIRATORY: CULTURE: NORMAL

## 2018-03-14 MED ORDER — IPRATROPIUM-ALBUTEROL 0.5-2.5 (3) MG/3ML IN SOLN
3.0000 mL | Freq: Three times a day (TID) | RESPIRATORY_TRACT | Status: DC
  Administered 2018-03-14 – 2018-03-17 (×9): 3 mL via RESPIRATORY_TRACT
  Filled 2018-03-14 (×9): qty 3

## 2018-03-14 MED ORDER — SODIUM CHLORIDE 0.9 % IV BOLUS
500.0000 mL | Freq: Once | INTRAVENOUS | Status: AC
  Administered 2018-03-14: 500 mL via INTRAVENOUS

## 2018-03-14 NOTE — Progress Notes (Signed)
No urine output so far this shift. Bladder scan only revealed 31 mL. Night coverage notified. Will continue to monitor.

## 2018-03-14 NOTE — Discharge Instructions (Signed)
Community-Acquired Pneumonia, Adult °Pneumonia is an infection of the lungs. It causes swelling in the airways of the lungs. Mucus and fluid may also build up inside the airways. °One type of pneumonia can happen while a person is in a hospital. A different type can happen when a person is not in a hospital (community-acquired pneumonia).  °What are the causes? ° °This condition is caused by germs (viruses, bacteria, or fungi). Some types of germs can be passed from one person to another. This can happen when you breathe in droplets from the cough or sneeze of an infected person. °What increases the risk? °You are more likely to develop this condition if you: °· Have a long-term (chronic) disease, such as: °? Chronic obstructive pulmonary disease (COPD). °? Asthma. °? Cystic fibrosis. °? Congestive heart failure. °? Diabetes. °? Kidney disease. °· Have HIV. °· Have sickle cell disease. °· Have had your spleen removed. °· Do not take good care of your teeth and mouth (poor dental hygiene). °· Have a medical condition that increases the risk of breathing in droplets from your own mouth and nose. °· Have a weakened body defense system (immune system). °· Are a smoker. °· Travel to areas where the germs that cause this illness are common. °· Are around certain animals or the places they live. °What are the signs or symptoms? °· A dry cough. °· A wet (productive) cough. °· Fever. °· Sweating. °· Chest pain. This often happens when breathing deeply or coughing. °· Fast breathing or trouble breathing. °· Shortness of breath. °· Shaking chills. °· Feeling tired (fatigue). °· Muscle aches. °How is this treated? °Treatment for this condition depends on many things. Most adults can be treated at home. In some cases, treatment must happen in a hospital. Treatment may include: °· Medicines given by mouth or through an IV tube. °· Being given extra oxygen. °· Respiratory therapy. °In rare cases, treatment for very bad pneumonia  may include: °· Using a machine to help you breathe. °· Having a procedure to remove fluid from around your lungs. °Follow these instructions at home: °Medicines °· Take over-the-counter and prescription medicines only as told by your doctor. °? Only take cough medicine if you are losing sleep. °· If you were prescribed an antibiotic medicine, take it as told by your doctor. Do not stop taking the antibiotic even if you start to feel better. °General instructions ° °· Sleep with your head and neck raised (elevated). You can do this by sleeping in a recliner or by putting a few pillows under your head. °· Rest as needed. Get at least 8 hours of sleep each night. °· Drink enough water to keep your pee (urine) pale yellow. °· Eat a healthy diet that includes plenty of vegetables, fruits, whole grains, low-fat dairy products, and lean protein. °· Do not use any products that contain nicotine or tobacco. These include cigarettes, e-cigarettes, and chewing tobacco. If you need help quitting, ask your doctor. °· Keep all follow-up visits as told by your doctor. This is important. °How is this prevented? °A shot (vaccine) can help prevent pneumonia. Shots are often suggested for: °· People older than 56 years of age. °· People older than 56 years of age who: °? Are having cancer treatment. °? Have long-term (chronic) lung disease. °? Have problems with their body's defense system. °You may also prevent pneumonia if you take these actions: °· Get the flu (influenza) shot every year. °· Go to the dentist as   often as told.  Wash your hands often. If you cannot use soap and water, use hand sanitizer. Contact a doctor if:  You have a fever.  You lose sleep because your cough medicine does not help. Get help right away if:  You are short of breath and it gets worse.  You have more chest pain.  Your sickness gets worse. This is very serious if: ? You are an older adult. ? Your body's defense system is weak.  You  cough up blood. Summary  Pneumonia is an infection of the lungs.  Most adults can be treated at home. Some will need treatment in a hospital.  Drink enough water to keep your pee pale yellow.  Get at least 8 hours of sleep each night. This information is not intended to replace advice given to you by your health care provider. Make sure you discuss any questions you have with your health care provider. Document Released: 08/05/2007 Document Revised: 10/14/2017 Document Reviewed: 10/14/2017 Elsevier Interactive Patient Education  2019 [place] Pneumonia  Healthcare-associated pneumonia is a lung infection that a person can get when in a health care setting or during certain procedures. The infection causes air sacs inside the lungs to fill with pus or fluid. Healthcare-associated pneumonia is usually caused by bacteria that are common in health care settings. These bacteria may be resistant to some antibiotic medicines. What are the causes? This condition is caused by bacteria that get into your lungs. You can get this condition if you:  Breathe in droplets from an infected person's cough or sneeze.  Touch something that an infected person coughed or sneezed on and then touch your mouth, nose, or eyes.  Have a bacterial infection somewhere else in your body, if the bacteria spread to your lungs through your blood. What increases the risk? This condition is more likely to develop in people who:  Have a disease that weakens their body's defense system (immune system) or their ability to cough out germs.  Are older than age 22.  Having trouble swallowing.  Use a feeding or breathing tube.  Have a cold or the flu.  Have an IV tube inserted in a vein.  Have surgery.  Have a bed sore.  Live in a long-term care facility, such as a nursing home.  Were in the hospital for two or more days in the past 3 months.  Received hemodialysis in the past 30  days. What are the signs or symptoms? Symptoms of this condition include:  Fever.  Chills.  Cough.  Shortness of breath.  Wheezing or crackling sounds when breathing. How is this diagnosed? This condition may be diagnosed based on:  Your symptoms.  A chest X-ray.  A measurement of the amount of oxygen in your blood. How is this treated? This condition is treated with antibiotics. Your health care provider may take a sample of cells (culture) from your throat to determine what type of bacteria is in your lungs and change your antibiotic based on the results. If you have bacteria in your blood, trouble breathing, or a low oxygen level, you may need to be treated at the hospital. At the hospital, you will be given antibiotics through an IV tube. You may also be given oxygen or breathing treatments. Follow these instructions at home: Medicine  Take your antibiotic medicine as told by your health care provider. Do not stop taking the antibiotic even if you start to feel better.  Take over-the-counter  and other prescription medicines only as told by your health care provider. Activity  Rest at home until you feel better.  Return to your normal activities as told by your health care provider. Ask your health care provider what activities are safe for you. General instructions   Drink enough fluid to keep your urine clear or pale yellow.  Do not use any products that contain nicotine or tobacco, such as cigarettes and e-cigarettes. If you need help quitting, ask your health care provider.  Limit alcohol intake to no more than 1 drink per day for nonpregnant women and 2 drinks per day for men. One drink equals 12 oz of beer, 5 oz of wine, or 1 oz of hard liquor.  Keep all follow-up visits as told by your health care provider. This is important. How is this prevented? Actions that I can take To lower your risk of getting this condition again:  Do not smoke. This includes  e-cigarettes.  Do not drink too much alcohol.  Keep your immune system healthy by eating well and getting enough sleep.  Get a flu shot every year (annually).  Get a pneumonia vaccination if: ? You are older than age 25. ? You smoke. ? You have a long-lasting condition like lung disease.  Exercise your lungs by taking deep breaths, walking, and using an incentive spirometer as directed.  Wash your hands often with soap and water. If you cannot get to a sink to wash your hands, use an alcohol-based hand cleaner.  Make sure your health care providers are washing their hands. If you do not see them wash their hands, ask them to do so.  When you are in a health care facility, avoid touching your eyes, nose, and mouth.  Avoid touching any surface near where people have coughed or sneezed.  Stand away from sick people when they are coughing or sneezing.  Wear a mask if you cannot avoid exposure to people who are sick.  Clean all surfaces often with a disinfectant cleaner, especially if someone is sick at home or work.  Precautions of my health care team Hospitals, nursing homes, and other health care facilities take special care to try to prevent healthcare-associated pneumonia. To do this, your health care team may:  Clean their hands with soap and water or with alcohol-based hand sanitizer before and after seeing patients.  Wear gloves or masks during treatment.  Sanitize medical instruments, tubes, other equipment, and surfaces in patient rooms.  Raise (elevate) the head of your hospital bed so you are not lying flat. The head of the bed may be elevated 30 degrees or more.  Have you sit up and move around as soon as possible after surgery.  Only insert a breathing tube if needed.  Do these things for you if you have a breathing tube: ? Clean the inside of your mouth regularly. ? Remove the breathing tube as soon as it is no longer needed. Contact a health care provider  if:  Your symptoms do not get better or they get worse.  Your symptoms come back after you have finished taking your antibiotics. Get help right away if:  You have trouble breathing.  You have confusion or difficulty thinking. This information is not intended to replace advice given to you by your health care provider. Make sure you discuss any questions you have with your health care provider. Document Released: 07/09/2015 Document Revised: 12/03/2015 Document Reviewed: 11/15/2015 Elsevier Interactive Patient Education  2019 Elsevier  Inc. ° °

## 2018-03-14 NOTE — Progress Notes (Signed)
CSW following patient for support and discharge needs. Patient if from [facility] and will be going back once facility obtains authorization through [place]. Facility stated they will reach out to CSW once auth has been given.   Rhea Pink, MSW,  [place]

## 2018-03-14 NOTE — Progress Notes (Signed)
Pt blood pressure 90/49-night coverage notified and ordered a 500 cc bolus. Fluids going now-will continue to monitor.

## 2018-03-14 NOTE — NC FL2 (Signed)
[place] LEVEL OF CARE SCREENING TOOL     IDENTIFICATION  Patient Name: Stephanie [name] Birthdate: 11-28-1960 Sex: female Admission Date (Current Location): 03/09/2018  [facility] and [place] Number:  Herbalist and Address:  [facility],  [place], [place]      Provider Number: [id number]  Attending Physician Name and Address:  Kayleen Memos, DO  Relative Name and Phone Number:  Corene Cornea banks, [phone]    Current Level of Care: Hospital Recommended Level of Care: [place] Prior Approval Number:    Date Approved/Denied:   PASRR Number:    Discharge Plan:      Current Diagnoses: Patient Active Problem List   Diagnosis Date Noted  . HAP (hospital-acquired pneumonia) 03/09/2018  . AKI (acute kidney injury) ([place]) 03/09/2018  . Chronic respiratory failure with hypoxia ([place]) 03/09/2018  . Hypertension 02/16/2018  . Morbid obesity with BMI of 70 and over, adult ([place]) 02/16/2018  . Pressure injury of skin 01/18/2018  . Sepsis ([place]) 01/01/2018    Orientation RESPIRATION BLADDER Height & Weight     Self, Time, Situation, Place  O2(3L) Incontinent, External catheter Weight: (!) 415 lb (188.2 kg) Height:  5\' 4"  (162.6 cm)  BEHAVIORAL SYMPTOMS/MOOD NEUROLOGICAL BOWEL NUTRITION STATUS      Incontinent Diet(regular)  AMBULATORY STATUS COMMUNICATION OF NEEDS Skin   Extensive Assist Verbally Other (Comment)(wound on abdomen)                       Personal Care Assistance Level of Assistance  Bathing, Feeding, Dressing Bathing Assistance: Maximum assistance Feeding assistance: Limited assistance Dressing Assistance: Maximum assistance     Functional Limitations Info  Sight, Hearing, Speech Sight Info: Adequate Hearing Info: Adequate Speech Info: Adequate    SPECIAL CARE FACTORS FREQUENCY  PT (By licensed PT), OT (By licensed OT)     PT Frequency: 5x wk OT Frequency: 5x wk             Contractures Contractures Info: Not present    Additional Factors Info  Code Status, Allergies Code Status Info: Full code Allergies Info: no know allergies           Current Medications (03/14/2018):  This is the current hospital active medication list Current Facility-Administered Medications  Medication Dose Route Frequency Provider Last Rate Last Dose  . 0.9 %  sodium chloride infusion   Intra-arterial PRN Irene Pap N, DO 10 mL/hr at 03/12/18 1600    . acetaminophen (TYLENOL) tablet 500-1,000 mg  500-1,000 mg Oral Q6H PRN Marcell Anger, MD   500 mg at 03/13/18 2221  . bisacodyl (DULCOLAX) suppository 10 mg  10 mg Rectal Daily PRN Marcell Anger, MD      . chlorhexidine (PERIDEX) 0.12 % solution 15 mL  15 mL Mouth Rinse BID Irene Pap N, DO   15 mL at 03/14/18 6644  . Chlorhexidine Gluconate Cloth 2 % PADS 6 each  6 each Topical Q0600 Marcell Anger, MD   6 each at 03/14/18 [phone]  . enoxaparin (LOVENOX) injection 100 mg  0.5 mg/kg Subcutaneous Q24H Marcell Anger, MD   100 mg at 03/14/18 1242  . feeding supplement (BOOST / RESOURCE BREEZE) liquid 1 Container  1 Container Oral BID BM Irene Pap N, DO   1 Container at 03/13/18 1343  . feeding supplement (ENSURE ENLIVE) (ENSURE ENLIVE) liquid 237 mL  237 mL Oral TID BM Spongberg, Audie Pinto, MD  237 mL at 03/14/18 1241  . feeding supplement (PRO-STAT SUGAR FREE 64) liquid 30 mL  30 mL Oral BID Marcell Anger, MD   30 mL at 03/14/18 7412  . furosemide (LASIX) tablet 40 mg  40 mg Oral BID Irene Pap N, DO   40 mg at 03/14/18 0817  . guaiFENesin (MUCINEX) 12 hr tablet 1,200 mg  1,200 mg Oral BID Irene Pap N, DO   1,200 mg at 03/14/18 8786  . ipratropium-albuterol (DUONEB) 0.5-2.5 (3) MG/3ML nebulizer solution 3 mL  3 mL Nebulization Q4H PRN Spongberg, Audie Pinto, MD      . ipratropium-albuterol (DUONEB) 0.5-2.5 (3) MG/3ML nebulizer solution 3 mL  3 mL Nebulization TID  Irene Pap N, DO   3 mL at 03/14/18 1437  . magnesium hydroxide (MILK OF MAGNESIA) suspension 30 mL  30 mL Oral Q12H PRN Marcell Anger, MD      . MEDLINE mouth rinse  15 mL Mouth Rinse q12n4p Hall, Carole N, DO   15 mL at 03/13/18 1515  . metoprolol tartrate (LOPRESSOR) injection 5 mg  5 mg Intravenous Q5 min PRN Irene Pap N, DO      . metoprolol tartrate (LOPRESSOR) tablet 25 mg  25 mg Oral BID Irene Pap N, DO   25 mg at 03/14/18 7672  . multivitamin with minerals tablet 1 tablet  1 tablet Oral Daily Marcell Anger, MD   1 tablet at 03/14/18 0947  . polyethylene glycol (MIRALAX / GLYCOLAX) packet 17 g  17 g Oral Daily PRN Spongberg, Audie Pinto, MD      . polyvinyl alcohol (LIQUIFILM TEARS) 1.4 % ophthalmic solution 1 drop  1 drop Both Eyes PRN Irene Pap N, DO   1 drop at 03/11/18 2105  . sodium chloride flush (NS) 0.9 % injection 10-40 mL  10-40 mL Intracatheter Q12H Hall, Carole N, DO   10 mL at 03/14/18 0924  . sodium chloride flush (NS) 0.9 % injection 10-40 mL  10-40 mL Intracatheter PRN Kayleen Memos, DO         Discharge Medications: Please see discharge summary for a list of discharge medications.  Relevant Imaging Results:  Relevant Lab Results:   Additional Information SSN: [id number]  Wende Neighbors, LCSW

## 2018-03-14 NOTE — Discharge Summary (Signed)
Discharge Summary  Stephanie [name] IOX:[id number] DOB: 1961/02/25  PCP: Patient, No Pcp Per  Admit date: 03/09/2018 Discharge date: 03/14/2018  Time spent: 35 minutes  Recommendations for Outpatient Follow-up:  1. Follow-up with your PCP 2. Take your medications as prescribed 3. Continue physical therapy at SNF 4. Fall precautions  To note, patient had some inaccurate blood pressure readings which were verified by arterial line blood pressures.   Discharge Diagnoses:  Active Hospital Problems   Diagnosis Date Noted  . HAP (hospital-acquired pneumonia) 03/09/2018  . AKI (acute kidney injury) ([place]) 03/09/2018  . Chronic respiratory failure with hypoxia ([place]) 03/09/2018  . Hypertension 02/16/2018  . Morbid obesity with BMI of 70 and over, adult [facility]) 02/16/2018    Resolved Hospital Problems  No resolved problems to display.    Discharge Condition: Stable  Diet recommendation: Resume previous diet  Vitals:   03/14/18 0844 03/14/18 0921  BP:  (!) 145/70  Pulse: 89 90  Resp: 20   Temp:    SpO2: 97%     History of present illness:  58 year old female with a history of hypertension, super morbid obesity with BMI greater than 70, recent history of sepsis due to lower extremity cellulitis, acute hypoxic respiratory failure, obesity hypoventilation, who presented to [facility] ED with malaise, cough, congestion of a few days duration.  Chest x-ray notable for right basilar infiltrates.  Also presented with acute kidney injury.  Hospital course complicated by supraventricular tachycardia which resolved after resuming her home dose of metoprolol.  She also had intermediate.  Self hypertension which were proven to be inaccurate blood pressure readings.  Arterial line placed on 03/11/2018 revealed adequate blood pressures.  Line was [place] on 03/12/2018.  03/14/2018: Patient seen and examined at her bedside.  She states she feels much better and back to her baseline.  She has no new complaints.   She denies chest pain, palpitations, or dyspnea at rest.  She is eager to resume her physical therapy at SNF.  On the day of discharge, the patient was hemodynamically stable.  She will need to follow-up with her primary care provider and take her medications as prescribed.  She will also need to continue physical therapy in order to rebuild her strength and mobility.   Hospital Course:  Principal Problem:   HAP (hospital-acquired pneumonia) Active Problems:   Hypertension   Morbid obesity with BMI of 70 and over, adult ([place])   AKI (acute kidney injury) ([place])   Chronic respiratory failure with hypoxia (HCC)  HCAP, poa Completed 5 days of cefepime and IV vancomycin Independently reviewed chest x-ray done on admission which revealed right middle lobe infiltrates Afebrile with no leukocytosis Influenza A&B negative procalcitonin 0.19 O2 supplementation to maintain O2 saturation greater than 92%  Acute on chronic hypoxic respiratory failure complicated by obesity hypoventilation syndrome Continue O2 supplementation to maintain O2 saturation greater than 92% Strongly recommend weight loss outpatient  Chronic diastolic CHF with grade 2 diastolic dysfunction Continue p.o. Lasix 40 mg twice daily Continue home cardiac medications on metoprolol and now on Lasix Strict I's and O's and daily weight  Resolving Hypotension Inaccurate blood pressures Arterial line placed in for accurate readings; pressure stable.  Discontinued arterial line on 03/12/18  Resolved Supraventricular tachycardia Initially heart rate was elevated greater than 120 Cardiology consulted and followed Also possibly driven by acute lung physiology Continue Lopressor home dose p.o. 25 mg twice daily  Resolved AKI Creatinine improved back to her baseline Presented with creatinine of 1.98 Creatinine  today 0.8 back to her baseline Avoid nephrotoxic agents and hypotension Monitor urine output  Super morbid  obesity with BMI 71 Strongly recommended weight loss outpatient Follow-up with PCP  Rectal bleeding with negative FOBT Noted by nursing, resolved Likely hemorrhoidal bleeding Patient states she's never had a colonoscopy Hemoglobin has been stable  Suspected dysphagia Speech therapist evaluated and recommended regular diet with thin liquids Mild risk for aspiration    Code Status:full code   Consultants:  none  Procedures:  none   Discharge Exam: BP (!) 145/70   Pulse 90   Temp 98.6 F (37 C) (Oral)   Resp 20   Ht 5\' 4"  (1.626 m)   Wt (!) 188.2 kg   SpO2 97%   BMI 71.23 kg/m  . General: 58 y.o. year-old female morbidly obese in no acute distress.  Alert and oriented x3. . Cardiovascular: Regular rate and rhythm with no rubs or gallops.  No thyromegaly or JVD noted.   Marland Kitchen Respiratory: Clear to auscultation with no wheezes or rales. Good inspiratory effort. . Abdomen: Soft nontender nondistended with normal bowel sounds x4 quadrants. . Musculoskeletal: Trace lower extremity edema. 2/4 pulses in all 4 extremities. Marland Kitchen Psychiatry: Mood is appropriate for condition and setting  Discharge Instructions You were cared for by a hospitalist during your hospital stay. If you have any questions about your discharge medications or the care you received while you were in the hospital after you are discharged, you can call the unit and asked to speak with the hospitalist on call if the hospitalist that took care of you is not available. Once you are discharged, your primary care physician will handle any further medical issues. Please note that NO REFILLS for any discharge medications will be authorized once you are discharged, as it is imperative that you return to your primary care physician (or establish a relationship with a primary care physician if you do not have one) for your aftercare needs so that they can reassess your need for medications and monitor your lab  values.   Allergies as of 03/14/2018   No Known Allergies     Medication List    STOP taking these medications   amLODipine 10 MG tablet Commonly known as:  NORVASC   enoxaparin 40 MG/0.4ML injection Commonly known as:  LOVENOX   lisinopril 20 MG tablet Commonly known as:  PRINIVIL,ZESTRIL     TAKE these medications   acetaminophen 500 MG tablet Commonly known as:  TYLENOL Take 500-1,000 mg by mouth every 6 (six) hours as needed for headache or pain.   bisacodyl 10 MG suppository Commonly known as:  DULCOLAX Place 10 mg rectally every 4 (four) hours as needed for moderate constipation.   CERTA-VITE PO Take 1 tablet by mouth daily. Certa-vite w/ anti-oxidants for wound healing   lactose free nutrition Liqd Take 237 mLs by mouth 2 (two) times daily. What changed:  Another medication with the same name was changed. Make sure you understand how and when to take each.   feeding supplement (ENSURE ENLIVE) Liqd Take 237 mLs by mouth 3 (three) times daily between meals. What changed:    when to take this  reasons to take this  additional instructions   feeding supplement (PRO-STAT SUGAR FREE 64) Liqd Take 30 mLs by mouth 2 (two) times daily.   furosemide 40 MG tablet Commonly known as:  LASIX Take 1 tablet (40 mg total) by mouth 2 (two) times daily.   ipratropium-albuterol 0.5-2.5 (3) MG/3ML  Soln Commonly known as:  DUONEB Take 3 mLs by nebulization every 4 (four) hours as needed.   magnesium hydroxide 400 MG/5ML suspension Commonly known as:  MILK OF MAGNESIA Take 30 mLs by mouth every 12 (twelve) hours as needed for mild constipation.   metoprolol tartrate 25 MG tablet Commonly known as:  LOPRESSOR Take 1 tablet (25 mg total) by mouth 2 (two) times daily.   mineral oil-hydrophilic petrolatum ointment Apply to feet daily for 90 days.   NONFORMULARY OR COMPOUNDED ITEM [place] Solution - soak feet for 15-20 minutes weekly for 4 weeks.   polyethylene  glycol packet Commonly known as:  MIRALAX / GLYCOLAX Take 17 g by mouth daily as needed for mild constipation.   protein supplement 6 g Powd Commonly known as:  RESOURCE BENEPROTEIN Take 1 scoop by mouth daily.      No Known Allergies Follow-up Information    [facility]. Call in 1 day(s).   Why:  Please call for a post hospital follow-up appointment. Contact information: [address] [place] [address] [phone]           The results of significant diagnostics from this hospitalization (including imaging, microbiology, ancillary and laboratory) are listed below for reference.    Significant Diagnostic Studies: Dg Chest 2 View  Result Date: 03/09/2018 CLINICAL DATA:  Cough and congestion for 2 days EXAM: CHEST - 2 VIEW COMPARISON:  01/21/2018 FINDINGS: Cardiac shadow remains enlarged. Patchy right basilar infiltrate is noted projecting in the right middle and lower lobes on the lateral projection. Left lung is clear. No bony abnormality is noted. Previously seen left-sided PICC line has been removed. IMPRESSION: Patchy right basilar infiltrate. Followup PA and lateral chest X-ray is recommended in 3-4 weeks following trial of antibiotic therapy to ensure resolution and exclude underlying malignancy. Electronically Signed   By: Inez Catalina M.D.   On: 03/09/2018 08:46   Dg Chest Port 1 View  Result Date: 03/13/2018 CLINICAL DATA:  Cough and congestion EXAM: PORTABLE CHEST 1 VIEW COMPARISON:  Chest radiograph 03/09/2018 FINDINGS: Patient is rotated to the left. Stable cardiomegaly. Similar-appearing bilateral mid and lower lung consolidative opacities. No definite pleural effusion or pneumothorax. IMPRESSION: Similar-appearing bilateral mid and lower lung opacities which may represent atelectasis or infection. Followup PA and lateral chest X-ray is recommended in 3-4 weeks following trial of antibiotic therapy to ensure  resolution and exclude underlying malignancy. Electronically Signed   By: Lovey Newcomer M.D.   On: 03/13/2018 09:10    Microbiology: Recent Results (from the past 240 hour(s))  Culture, sputum-assessment     Status: None   Collection Time: 03/09/18 12:43 PM  Result Value Ref Range Status   Specimen Description SPUTUM  Final   Special Requests NONE  Final   Sputum evaluation   Final    THIS SPECIMEN IS ACCEPTABLE FOR SPUTUM CULTURE Performed at [facility], [place] [address], [place], [place] [address]    Report Status 03/12/2018 FINAL  Final  Culture, respiratory     Status: None (Preliminary result)   Collection Time: 03/09/18 12:43 PM  Result Value Ref Range Status   Specimen Description   Final    SPUTUM Performed at [facility] [address] [place], [place] [address]    Special Requests   Final    NONE Reflexed from [id number] Performed at [facility], [place] [address], [place], [place] [address]    Gram Stain  Final    MODERATE WBC PRESENT, PREDOMINANTLY PMN RARE SQUAMOUS EPITHELIAL CELLS PRESENT FEW GRAM POSITIVE COCCI FEW GRAM NEGATIVE RODS Performed at [facility], [place] [address], [place], [place] [address]    Culture CULTURE REINCUBATED FOR BETTER GROWTH  Final   Report Status PENDING  Incomplete  Blood culture (routine x 2)     Status: None   Collection Time: 03/09/18  1:16 PM  Result Value Ref Range Status   Specimen Description   Final    BLOOD RIGHT HAND Performed at [facility] [address] [place], [place] [address]    Special Requests   Final    BOTTLES DRAWN AEROBIC AND ANAEROBIC Blood Culture adequate volume Performed at [facility] [address] [place], [place] [address]    Culture   Final    NO GROWTH 5 DAYS Performed at [facility], [place] [address] [place], [place] [address]    Report Status 03/14/2018 FINAL  Final  MRSA PCR  Screening     Status: Abnormal   Collection Time: 03/09/18  1:23 PM  Result Value Ref Range Status   MRSA by PCR POSITIVE (A) NEGATIVE Final    Comment:        The GeneXpert MRSA Assay (FDA approved for NASAL specimens only), is one component of a comprehensive MRSA colonization surveillance program. It is not intended to diagnose MRSA infection nor to guide or monitor treatment for MRSA infections. RESULT CALLED TO, READ BACK BY AND VERIFIED WITH: Letta Moynahan [id number] @ [address] Performed at [facility] [address], [place], [place] [address]   Blood culture (routine x 2)     Status: None   Collection Time: 03/09/18  1:26 PM  Result Value Ref Range Status   Specimen Description   Final    BLOOD LEFT HAND Performed at [facility] [address], [place], [place] [address]    Special Requests   Final    BOTTLES DRAWN AEROBIC AND ANAEROBIC Blood Culture adequate volume Performed at [facility] [address], [place], [place] [address]    Culture   Final    NO GROWTH 5 DAYS Performed at [facility], [place] [address] [place], [place] [address]    Report Status 03/14/2018 FINAL  Final     Labs: Basic Metabolic Panel: Recent Labs  Lab 03/10/18 0432 03/11/18 0309 03/12/18 0324 03/13/18 0243 03/14/18 0259  NA 140 142 144 147* 146*  K 5.0 5.2* 4.4 4.8 4.8  CL 97* 106 107 109 108  CO2 32 28 30 32 31  GLUCOSE 109* 114* 95 108* 103*  BUN 100* 74* 51* 42* 38*  CREATININE 1.77* 1.23* 0.98 0.84 0.91  CALCIUM 9.0 9.1 9.3 9.6 9.5   Liver Function Tests: Recent Labs  Lab 03/09/18 0822 03/11/18 0309  AST 19 17  ALT 11 7  ALKPHOS 57 42  BILITOT 0.8 0.5  PROT 7.6 6.7  ALBUMIN 3.2* 2.6*   Recent Labs  Lab 03/09/18 0822  LIPASE 47   No results for input(s): AMMONIA in the last 168 hours. CBC: Recent Labs  Lab 03/09/18 0822  03/10/18 0432 03/11/18 0309 03/12/18 0324 03/13/18 0243  03/14/18 0259  WBC 11.7*   < > 9.4 7.0 6.6 7.3 9.8  NEUTROABS 7.7  --  5.3  --   --   --   --   HGB 12.6   < >  11.8* 11.6* 11.1* 11.4* 11.0*  HCT 43.5   < > 41.3 41.2 39.3 41.0 39.5  MCV 111.3*   < > 111.0* 113.8* 112.3* 112.0* 114.2*  PLT 213   < > 236 186 196 223 219   < > = values in this interval not displayed.   Cardiac Enzymes: Recent Labs  Lab 03/10/18 0940 03/13/18 1652  TROPONINI 0.04* 0.03*   BNP: BNP (last 3 results) Recent Labs    01/10/18 1508 01/12/18 0315 03/09/18 0822  BNP 594.0* 261.0* 50.0    ProBNP (last 3 results) No results for input(s): PROBNP in the last 8760 hours.  CBG: Recent Labs  Lab 03/10/18 2024  GLUCAP 101*       Signed:  Kayleen Memos, MD [facility] 03/14/2018, 10:43 AM

## 2018-03-15 MED ORDER — IPRATROPIUM-ALBUTEROL 0.5-2.5 (3) MG/3ML IN SOLN: 3.0000 mL | Freq: Three times a day (TID) | RESPIRATORY_TRACT | 0 refills | Status: DC

## 2018-03-15 NOTE — Clinical Social Work Note (Signed)
Clinical Social Work Assessment  Patient Details  Name: Stephanie [name] MRN: [id number] Date of Birth: 06-Aug-1960  Date of referral:  03/15/18               Reason for consult:  Facility Placement, Discharge Planning                Permission sought to share information with:  Family Supports Permission granted to share information::  Yes, Verbal Permission Granted  Name::     Gwenlyn Fudge  Agency::  [facility]  Relationship::  brother  Contact Information:  [phone]  Housing/Transportation Living arrangements for the past 2 months:  [place] of Information:  Patient Patient Interpreter Needed:  None Criminal Activity/Legal Involvement Pertinent to Current Situation/Hospitalization:  No - Comment as needed Significant Relationships:  Siblings Lives with:  Self Do you feel safe going back to the place where you live?  Yes Need for family participation in patient care:  Yes (Comment)  Care giving concerns:  No family at bedside. Patient stated she would like to return back to [place] to finish her rehab. patient works for [employer]. Patient stated she has support from her brother.    Social Worker assessment / plan:  CSW met patient at bedside to discuss discharge plans. Patient stated she would like to return back to [place]. Patient aware that authorization through insurance has been started by facility and she is waiting for insurance to approve or deny her. CSW will continue to follow   Employment status:  [employer] information:  Other (Comment Required)(BC/BS) PT Recommendations:  [place] / Referral to community resources:  [place]  Patient/Family's Response to care:  Patient very pleasant and appreciative of the care she has received   Patient/Family's Understanding of and Emotional Response to Diagnosis, Current Treatment, and Prognosis:  Patient wanting to go back to [place]  Emotional  Assessment Appearance:  Appears stated age Attitude/Demeanor/Rapport:  Engaged Affect (typically observed):  Accepting, Pleasant Orientation:  Oriented to Self, Oriented to Place, Oriented to  Time, Oriented to Situation Alcohol / Substance use:  Not Applicable Psych involvement (Current and /or in the community):  No (Comment)  Discharge Needs  Concerns to be addressed:  Care Coordination Readmission within the last 30 days:  No Current discharge risk:  Dependent with Mobility Barriers to Discharge:  [place], LCSW 03/15/2018, 10:11 AM

## 2018-03-15 NOTE — Progress Notes (Signed)
Pt's brother called and requested to speak to the Charge Nurse.  CN asked pt if ok to speak with the brother about her care and she consented.  CN stayed in room and spoke with brother in presence of pt.  Brother was very upset that pt was to be discharged and had no facility to go to.  Discussed information that CM and SW had given to the patient today and assured him that they would discuss again tomorrow.  He requested name of hospital administrator.  Provided director of department name and patient's brother persistently insisted on administrator name.  Provided with CN name, director name, social work and Software engineer.   Pt's brother later called again and asked if the President had anything to do with a particular facility.  I assured him he did not and asked if he could speak with SW in the morning for further update. Andre Lefort

## 2018-03-15 NOTE — Discharge Summary (Addendum)
Discharge Summary  Stephanie [name] OVF:[id number] DOB: Jul 26, 1960  PCP: Patient, No Pcp Per  Admit date: 03/09/2018 Discharge date:03/18/2018 Time spent: 35 minutes  Discharge delayed due to bed placement.  Recommendations for Outpatient Follow-up:  1. Follow-up with your PCP 2. Take your medications as prescribed 3. Continue physical therapy at SNF 4. Fall precautions  To note, patient had some inaccurate blood pressure readings which were verified by arterial line blood pressures.   Discharge Diagnoses:  Active Hospital Problems   Diagnosis Date Noted  . HAP (hospital-acquired pneumonia) 03/09/2018  . AKI (acute kidney injury) ([facility]) 03/09/2018  . Chronic respiratory failure with hypoxia ([place]) 03/09/2018  . Hypertension 02/16/2018  . Morbid obesity with BMI of 70 and over, adult [facility]) 02/16/2018    Resolved Hospital Problems  No resolved problems to display.    Discharge Condition: Stable  Diet recommendation: Resume previous diet  Vitals:   03/15/18 1333 03/15/18 1624  BP: 136/83 126/67  Pulse: 79 89  Resp: 20 18  Temp: 98.6 F (37 C)   SpO2: 97% 95%    History of present illness:  59 year old female with a history of hypertension, super morbid obesity with BMI greater than 70, recent history of sepsis due to lower extremity cellulitis, acute hypoxic respiratory failure, obesity hypoventilation, who presented to [facility] ED with malaise, cough, congestion of a few days duration.  Chest x-ray notable for right basilar infiltrates.  Also presented with acute kidney injury.  Hospital course complicated by supraventricular tachycardia which resolved after resuming her home dose of metoprolol.  She also had intermediate.  Self hypertension which were proven to be inaccurate blood pressure readings.  Arterial line placed on 03/11/2018 revealed adequate blood pressures.  Line was [place] on 03/12/2018.  03/14/2018: Patient seen and examined at her bedside.  She states she feels much  better and back to her baseline.  She has no new complaints.  She denies chest pain, palpitations, or dyspnea at rest.  She is eager to resume her physical therapy at SNF.   03/15/2018: Patient seen and examined at bedside.  No acute events overnight.  Insurance denied SNF placement.  She has no new complaint.  Vital signs and lab studies are unremarkable.  On the day of discharge, the patient was hemodynamically stable.  She will need to follow-up with her primary care provider and take her medications as prescribed.  She will also need to continue physical therapy in order to rebuild her strength and mobility.   Hospital Course:  Principal Problem:   HAP (hospital-acquired pneumonia) Active Problems:   Hypertension   Morbid obesity with BMI of 70 and over, adult ([place])   AKI (acute kidney injury) ([place])   Chronic respiratory failure with hypoxia (H[facility])  HCAP, poa Completed 5 days of cefepime and IV vancomycin Independently reviewed chest x-ray done on admission which revealed right middle lobe infiltrates Afebrile with no leukocytosis Influenza A&B negative procalcitonin 0.19 O2 supplementation to maintain O2 saturation greater than 92%  Acute on chronic hypoxic respiratory failure complicated by obesity hypoventilation syndrome Continue O2 supplementation to maintain O2 saturation greater than 92% Strongly recommend weight loss outpatient  Chronic diastolic CHF with grade 2 diastolic dysfunction Continue p.o. Lasix 40 mg twice daily Continue home cardiac medications on metoprolol and now on Lasix Strict I's and O's and daily weight  Resolving Hypotension Inaccurate blood pressures Arterial line placed in for accurate readings; pressure stable.  Discontinued arterial line on 03/12/18  Resolved Supraventricular tachycardia Initially heart rate  was elevated greater than 120 Cardiology consulted and followed Also possibly driven by acute lung physiology Continue Lopressor  home dose p.o. 25 mg twice daily  Resolved AKI Creatinine improved back to her baseline Presented with creatinine of 1.98 Creatinine today 0.8 back to her baseline Avoid nephrotoxic agents and hypotension Monitor urine output  Super morbid obesity with BMI 71 Strongly recommended weight loss outpatient Follow-up with PCP  Rectal bleeding with negative FOBT Noted by nursing, resolved Likely hemorrhoidal bleeding Patient states she's never had a colonoscopy Hemoglobin has been stable  Suspected dysphagia Speech therapist evaluated and recommended regular diet with thin liquids Mild risk for aspiration    Code Status:full code   Consultants:  none  Procedures:  none   Discharge Exam: BP 126/67 (BP Location: Left Arm)   Pulse 89   Temp 98.6 F (37 C) (Oral)   Resp 18   Ht 5\' 4"  (1.626 m)   Wt (!) 188.2 kg   SpO2 95%   BMI 71.23 kg/m  . General: 58 y.o. year-old female super morbidly obese in no acute distress.  Alert and oriented x3.   . Cardiovascular: Regular rate and rhythm with no rubs or gallops.  No JVD or thyromegaly noted.   Marland Kitchen Respiratory: Clear to auscultation with no wheezes or rales.  Good inspiratory effort. . Abdomen: Soft nontender nondistended with normal bowel sounds x4 quadrants. . Musculoskeletal: Trace lower extremity edema. 2/4 pulses in all 4 extremities. Marland Kitchen Psychiatry: Mood is appropriate for condition and setting  Discharge Instructions You were cared for by a hospitalist during your hospital stay. If you have any questions about your discharge medications or the care you received while you were in the hospital after you are discharged, you can call the unit and asked to speak with the hospitalist on call if the hospitalist that took care of you is not available. Once you are discharged, your primary care physician will handle any further medical issues. Please note that NO REFILLS for any discharge medications will be authorized  once you are discharged, as it is imperative that you return to your primary care physician (or establish a relationship with a primary care physician if you do not have one) for your aftercare needs so that they can reassess your need for medications and monitor your lab values.  Discharge Instructions    DME Nebulizer machine   Complete by:  As directed    Patient needs a nebulizer to treat with the following condition:  Hypoxia     Allergies as of 03/15/2018   No Known Allergies     Medication List    STOP taking these medications   amLODipine 10 MG tablet Commonly known as:  NORVASC   enoxaparin 40 MG/0.4ML injection Commonly known as:  LOVENOX   lisinopril 20 MG tablet Commonly known as:  PRINIVIL,ZESTRIL     TAKE these medications   acetaminophen 500 MG tablet Commonly known as:  TYLENOL Take 500-1,000 mg by mouth every 6 (six) hours as needed for headache or pain.   bisacodyl 10 MG suppository Commonly known as:  DULCOLAX Place 10 mg rectally every 4 (four) hours as needed for moderate constipation.   CERTA-VITE PO Take 1 tablet by mouth daily. Certa-vite w/ anti-oxidants for wound healing   lactose free nutrition Liqd Take 237 mLs by mouth 2 (two) times daily. What changed:  Another medication with the same name was changed. Make sure you understand how and when to take each.  feeding supplement (ENSURE ENLIVE) Liqd Take 237 mLs by mouth 3 (three) times daily between meals. What changed:    when to take this  reasons to take this  additional instructions   feeding supplement (PRO-STAT SUGAR FREE 64) Liqd Take 30 mLs by mouth 2 (two) times daily.   furosemide 40 MG tablet Commonly known as:  LASIX Take 1 tablet (40 mg total) by mouth 2 (two) times daily.   ipratropium-albuterol 0.5-2.5 (3) MG/3ML Soln Commonly known as:  DUONEB Take 3 mLs by nebulization every 4 (four) hours as needed. What changed:  Another medication with the same name was added.  Make sure you understand how and when to take each.   ipratropium-albuterol 0.5-2.5 (3) MG/3ML Soln Commonly known as:  DUONEB Take 3 mLs by nebulization 3 (three) times daily. What changed:  You were already taking a medication with the same name, and this prescription was added. Make sure you understand how and when to take each.   magnesium hydroxide 400 MG/5ML suspension Commonly known as:  MILK OF MAGNESIA Take 30 mLs by mouth every 12 (twelve) hours as needed for mild constipation.   metoprolol tartrate 25 MG tablet Commonly known as:  LOPRESSOR Take 1 tablet (25 mg total) by mouth 2 (two) times daily.   mineral oil-hydrophilic petrolatum ointment Apply to feet daily for 90 days.   NONFORMULARY OR COMPOUNDED ITEM [place] Solution - soak feet for 15-20 minutes weekly for 4 weeks.   polyethylene glycol packet Commonly known as:  MIRALAX / GLYCOLAX Take 17 g by mouth daily as needed for mild constipation.   protein supplement 6 g Powd Commonly known as:  RESOURCE BENEPROTEIN Take 1 scoop by mouth daily.            Durable Medical Equipment  (From admission, onward)         Start     Ordered   03/15/18 1320  For home use only DME oxygen  Once    Question Answer Comment  Mode or (Route) Nasal cannula   Liters per Minute 2   Frequency Continuous (stationary and portable oxygen unit needed)   Oxygen conserving device Yes   Oxygen delivery system Gas      03/15/18 1319   03/15/18 1320  For home use only DME Pulse oximeter  Once     03/15/18 1319   03/15/18 1318  For home use only DME Hospital bed  Once    Question Answer Comment  The above medical condition requires: Patient requires the ability to reposition frequently   Head must be elevated greater than: 30 degrees   Bed type Heavy-duty, semi-electric (for patients >350 lbs.)   Investment banker, corporate Yes   Support Surface: Alternating Pressure Pad and Pump      03/15/18 1319   03/15/18 0000  DME  Nebulizer machine    Question:  Patient needs a nebulizer to treat with the following condition  Answer:  Hypoxia   03/15/18 1326         No Known Allergies  Contact information for follow-up providers    [place]. Call in 1 day(s).   Why:  Please call for a post hospital follow-up appointment. Contact information: [address] [place] [address] [phone]           Contact information for after-discharge care    Destination    [facility] .   Service:  Skilled Nursing  Contact information: [address] [place] [phone]                   The results of significant diagnostics from this hospitalization (including imaging, microbiology, ancillary and laboratory) are listed below for reference.    Significant Diagnostic Studies: Dg Chest 2 View  Result Date: 03/09/2018 CLINICAL DATA:  Cough and congestion for 2 days EXAM: CHEST - 2 VIEW COMPARISON:  01/21/2018 FINDINGS: Cardiac shadow remains enlarged. Patchy right basilar infiltrate is noted projecting in the right middle and lower lobes on the lateral projection. Left lung is clear. No bony abnormality is noted. Previously seen left-sided PICC line has been removed. IMPRESSION: Patchy right basilar infiltrate. Followup PA and lateral chest X-ray is recommended in 3-4 weeks following trial of antibiotic therapy to ensure resolution and exclude underlying malignancy. Electronically Signed   By: Inez Catalina M.D.   On: 03/09/2018 08:46   Dg Chest Port 1 View  Result Date: 03/13/2018 CLINICAL DATA:  Cough and congestion EXAM: PORTABLE CHEST 1 VIEW COMPARISON:  Chest radiograph 03/09/2018 FINDINGS: Patient is rotated to the left. Stable cardiomegaly. Similar-appearing bilateral mid and lower lung consolidative opacities. No definite pleural effusion or pneumothorax. IMPRESSION: Similar-appearing bilateral mid and lower lung  opacities which may represent atelectasis or infection. Followup PA and lateral chest X-ray is recommended in 3-4 weeks following trial of antibiotic therapy to ensure resolution and exclude underlying malignancy. Electronically Signed   By: Lovey Newcomer M.D.   On: 03/13/2018 09:10    Microbiology: Recent Results (from the past 240 hour(s))  Culture, sputum-assessment     Status: None   Collection Time: 03/09/18 12:43 PM  Result Value Ref [place] Status   Specimen Description SPUTUM  Final   Special Requests NONE  Final   Sputum evaluation   Final    THIS SPECIMEN IS ACCEPTABLE FOR SPUTUM CULTURE Performed at [facility], [place] [address] [place], [place] [address]    Report Status 03/12/2018 FINAL  Final  Culture, respiratory     Status: None   Collection Time: 03/09/18 12:43 PM  Result Value Ref [place] Status   Specimen Description   Final    SPUTUM Performed at [facility] [address] [place], [place] [address]    Special Requests   Final    NONE Reflexed from [id number] Performed at [facility], [place] [address], [place], [place] [address]    Gram Stain   Final    MODERATE WBC PRESENT, PREDOMINANTLY PMN RARE SQUAMOUS EPITHELIAL CELLS PRESENT FEW GRAM POSITIVE COCCI FEW GRAM NEGATIVE RODS    Culture   Final    FEW Consistent with normal respiratory flora. Performed at [facility], [place] [address], [place], [place] [address]    Report Status 03/14/2018 FINAL  Final  Blood culture (routine x 2)     Status: None   Collection Time: 03/09/18  1:16 PM  Result Value Ref [place] Status   Specimen Description   Final    BLOOD RIGHT HAND Performed at [facility] [address], [place], [place] [address]    Special Requests   Final    BOTTLES DRAWN AEROBIC AND ANAEROBIC Blood Culture adequate volume Performed at [facility] [address] [place], [place] [address]    Culture    Final    NO GROWTH 5 DAYS Performed at [facility], [place],  [place] [address]    Report Status 03/14/2018 FINAL  Final  MRSA PCR Screening     Status: Abnormal   Collection Time: 03/09/18  1:23 PM  Result Value Ref [place] Status   MRSA by PCR POSITIVE (A) NEGATIVE Final    Comment:        The GeneXpert MRSA Assay (FDA approved for NASAL specimens only), is one component of a comprehensive MRSA colonization surveillance program. It is not intended to diagnose MRSA infection nor to guide or monitor treatment for MRSA infections. RESULT CALLED TO, READ BACK BY AND VERIFIED WITH: Letta Moynahan [id number] @ [address] Performed at [facility] [address] [place], [place] [address]   Blood culture (routine x 2)     Status: None   Collection Time: 03/09/18  1:26 PM  Result Value Ref [place] Status   Specimen Description   Final    BLOOD LEFT HAND Performed at [facility] [address], [place], [place] [address]    Special Requests   Final    BOTTLES DRAWN AEROBIC AND ANAEROBIC Blood Culture adequate volume Performed at [facility] [address] [place], [place] [address]    Culture   Final    NO GROWTH 5 DAYS Performed at [facility], [place] [address], [place], [place] [address]    Report Status 03/14/2018 FINAL  Final     Labs: Basic Metabolic Panel: Recent Labs  Lab 03/10/18 0432 03/11/18 0309 03/12/18 0324 03/13/18 0243 03/14/18 0259  NA 140 142 144 147* 146*  K 5.0 5.2* 4.4 4.8 4.8  CL 97* 106 107 109 108  CO2 32 28 30 32 31  GLUCOSE 109* 114* 95 108* 103*  BUN 100* 74* 51* 42* 38*  CREATININE 1.77* 1.23* 0.98 0.84 0.91  CALCIUM 9.0 9.1 9.3 9.6 9.5   Liver Function Tests: Recent Labs  Lab 03/09/18 0822 03/11/18 0309  AST 19 17  ALT 11 7  ALKPHOS 57 42  BILITOT 0.8 0.5  PROT 7.6 6.7  ALBUMIN 3.2* 2.6*   Recent Labs  Lab 03/09/18 0822  LIPASE 47     No results for input(s): AMMONIA in the last 168 hours. CBC: Recent Labs  Lab 03/09/18 0822  03/10/18 0432 03/11/18 0309 03/12/18 0324 03/13/18 0243 03/14/18 0259  WBC 11.7*   < > 9.4 7.0 6.6 7.3 9.8  NEUTROABS 7.7  --  5.3  --   --   --   --   HGB 12.6   < > 11.8* 11.6* 11.1* 11.4* 11.0*  HCT 43.5   < > 41.3 41.2 39.3 41.0 39.5  MCV 111.3*   < > 111.0* 113.8* 112.3* 112.0* 114.2*  PLT 213   < > 236 186 196 223 219   < > = values in this interval not displayed.   Cardiac Enzymes: Recent Labs  Lab 03/10/18 0940 03/13/18 1652  TROPONINI 0.04* 0.03*   BNP: BNP (last 3 results) Recent Labs    01/10/18 1508 01/12/18 0315 03/09/18 0822  BNP 594.0* 261.0* 50.0    ProBNP (last 3 results) No results for input(s): PROBNP in the last 8760 hours.  CBG: Recent Labs  Lab 03/10/18 2024  GLUCAP 101*       Signed:  Kayleen Memos, MD [facility] 03/15/2018, 4:34 PM

## 2018-03-15 NOTE — Progress Notes (Signed)
I have assumed care of this pt at this time. Pt comfortable with no complaints or questions. Will continue to monitor.

## 2018-03-15 NOTE — Progress Notes (Signed)
Occupational Therapy Treatment Patient Details Name: Stephanie [name] MRN: [id number] DOB: March 25, 1960 Today's Date: 03/15/2018    History of present illness Pt is a 58 y/o female admitted from SNF with HAP.  PMH:  HTN, sepsis from cellulitis with acute respiratory failure Nov last year.   OT comments  Pt presents supine in bed very pleasant and agreeable to working with therapy. Initial plans to perform UB exercise for continued strengthening/endurance however pt with urgency to have BM. Pt required maxA (use of +3 utilized for safety) for placement of bed pan during session. Issued pt level 1 theraband and will plan to instruct pt in UB exercise during following treatment sessions. Continue per POC.   Follow Up Recommendations  SNF    Equipment Recommendations  None recommended by OT          Precautions / Restrictions Precautions Precautions: Fall Precaution Comments: profound weakness        Mobility Bed Mobility Overal bed mobility: Needs Assistance Bed Mobility: Rolling Rolling: Max assist;+2 for physical assistance         General bed mobility comments: assist to roll to L side for placement of bed pan, +3 utilized as nursing students present and able to provide increased assist. VCs for reacing with RUE to assist with transition  Transfers                                                                 ADL either performed or assessed with clinical judgement   ADL Overall ADL's : Needs assistance/impaired                             Toileting- Clothing Manipulation and Hygiene: Total assistance;+2 for physical assistance;+2 for safety/equipment;Bed level Toileting - Clothing Manipulation Details (indicate cue type and reason): bed reports need to use bed pan during session, +3 assist utilized for rolling and placing of bed pan (use of +3 as assist was available per nursing students                                Cognition Arousal/Alertness: Awake/alert Behavior During Therapy: WFL for tasks assessed/performed Overall Cognitive Status: Within Functional Limits for tasks assessed                                          Exercises Other Exercises Other Exercises: provided theraband for continued UE strengthening, did not get to instruct in exercises due to pt with sudden need to use bedpan   Shoulder Instructions       General Comments      Pertinent Vitals/ Pain       Pain Assessment: No/denies pain Pain Intervention(s): Monitored during session  Home Living                                          Prior Functioning/Environment              Frequency  Min 2X/week  Progress Toward Goals  OT Goals(current goals can now be found in the care plan section)  Progress towards OT goals: OT to reassess next treatment  Acute Rehab OT Goals Patient Stated Goal: regain strength; get back to where she was OT Goal Formulation: With patient Time For Goal Achievement: 03/26/18 Potential to Achieve Goals: [place] Discharge plan remains appropriate    Co-evaluation                 AM-PAC OT "6 Clicks" Daily Activity     Outcome Measure   Help from another person eating meals?: A Little Help from another person taking care of personal grooming?: A Little Help from another person toileting, which includes using toliet, bedpan, or urinal?: Total Help from another person bathing (including washing, rinsing, drying)?: Total Help from another person to put on and taking off regular upper body clothing?: Total Help from another person to put on and taking off regular lower body clothing?: Total 6 Click Score: 10    End of Session    OT Visit Diagnosis: Muscle weakness (generalized) (M62.81)   Activity Tolerance Patient tolerated treatment well   Patient Left in bed;with call bell/phone within reach   Nurse Communication  Mobility status        Time: 1430-1446 OT Time Calculation (min): 16 min  Charges: OT General Charges $OT Visit: 1 Visit OT Treatments $Self Care/Home Management : 8-22 mins  Lou Cal, OT Supplemental Rehabilitation Services Pager [phone] Office [phone]    Raymondo Band 03/15/2018, 4:59 PM

## 2018-03-15 NOTE — Care Management Note (Signed)
Case Management Note  Patient Details  Name: Emmarose Klinke MRN: [id number] Date of Birth: 11/28/1960  Subjective/Objective: CM/CSW went in rom to discuss d/c plans-patient unable to return back home,unable to get oob,or use bathroom-lives with elderly mother-max asst +2,BMI 71, noted-insurance denied d/max use of SNF days. CM attempted to set up HHC[place] to [place] patient, patient works-spoke to Lead CM/Dir CSW-unable to do peer to peer,unsafe to d/c home,patient must liquidate finances.Patient has agreed to private pay for SNF-CSW notified.MD updated.                   Action/Plan:dc SNF.   Expected Discharge Date:  03/14/18               Expected Discharge Plan:  Skilled Nursing Facility  In-House Referral:  Clinical Social Work  Discharge planning Services  CM Consult  Post Acute Care Choice:    Choice offered to:     DME Arranged:    DME Agency:     HH Arranged:    [place] Agency:     Status of Service:  Completed, signed off  If discussed at [employer], dates discussed:    Additional Comments:  Dessa Phi, RN 03/15/2018, 1:31 PM

## 2018-03-15 NOTE — Progress Notes (Signed)
Clinical Social Worker following patient for support and discharge needs. CSW received phone call from [facility] and they stated patient insurance has denied her claims to return back to facility. [facility] stated that patient has max out her 60 day SNF stay through her insurance. Patient is not eligible for an LOG through the hospital.  CSW met patient at bedside with RNCM present to make her aware that her insurance has denied her. Patient became tearful and upset. CSW asked if patient would be able to pay out of pocket for SNF stay, patient stated she would not be able to afford that. RNCM stated going home with home health would be an option. Patient stated she lives at home with her mother but stated she does not want to become a burden on her. Patient asked if she could get a moment to let her brother know that she would have to go home. CSW and RNCM stepped out of the room to give patient some time to process the news. \   Rhea Pink, MSW,  [place]

## 2018-03-15 NOTE — Progress Notes (Signed)
Physical Therapy Treatment Patient Details Name: Stephanie [name] MRN: [id number] DOB: June 11, 1960 Today's Date: 03/15/2018    History of Present Illness Pt is a 58 y/o female admitted from SNF with HAP.  PMH:  HTN, sepsis from cellulitis with acute respiratory failure Nov last year.    PT Comments    Patient progressing with participating with in bed mobility and working hard with LE therex in the bed despite pain and plans for probable d/c today to SNF.  Agree with continuing PT in the SNF setting and will follow up in acute setting if not d/c.    Follow Up Recommendations  SNF     Equipment Recommendations  None recommended by PT    Recommendations for Other Services       Precautions / Restrictions Precautions Precautions: Fall Precaution Comments: profound weakness     Mobility  Bed Mobility Overal bed mobility: Needs Assistance Bed Mobility: Rolling Rolling: Max assist;+2 for physical assistance         General bed mobility comments: assisted to roll in bed in supine, cues to reach for railing and hold, also replaced bed pad due to soiled with urine; deferred EOB due to pt fearful as weaker than prior to admission  Transfers                    Ambulation/Gait                 Stairs             Wheelchair Mobility    Modified Rankin (Stroke Patients Only)       Balance                                            Cognition Arousal/Alertness: Awake/alert Behavior During Therapy: WFL for tasks assessed/performed Overall Cognitive Status: Within Functional Limits for tasks assessed                                        Exercises General Exercises - Lower Extremity Ankle Circles/Pumps: AAROM;10 reps;Both;Supine Quad Sets: AROM;Both;10 reps;Supine Heel Slides: AAROM;Both;Supine;10 reps    General Comments        Pertinent Vitals/Pain Faces Pain Scale: Hurts even more Pain Location: R toes  hypersensitive to touch Pain Descriptors / Indicators: Sore Pain Intervention(s): Monitored during session;Limited activity within patient's tolerance    Home Living                      Prior Function            PT Goals (current goals can now be found in the care plan section) Progress towards PT goals: Progressing toward goals    Frequency    Min 2X/week      PT Plan Current plan remains appropriate    Co-evaluation              AM-PAC PT "6 Clicks" Mobility   Outcome Measure  Help needed turning from your back to your side while in a flat bed without using bedrails?: Total Help needed moving from lying on your back to sitting on the side of a flat bed without using bedrails?: Total Help needed moving to and from a bed to a chair (including a wheelchair)?:  Total Help needed standing up from a chair using your arms (e.g., wheelchair or bedside chair)?: Total Help needed to walk in hospital room?: Total Help needed climbing 3-5 steps with a railing? : Total 6 Click Score: 6    End of Session Equipment Utilized During Treatment: Oxygen Activity Tolerance: Patient limited by fatigue Patient left: in bed;with call bell/phone within reach   PT Visit Diagnosis: Muscle weakness (generalized) (M62.81)     Time: 9326-7124 PT Time Calculation (min) (ACUTE ONLY): 25 min  Charges:  $Therapeutic Exercise: 8-22 mins $Therapeutic Activity: 8-22 mins                     Magda Kiel, PT Acute Rehabilitation Services [phone] 03/15/2018    [name] 03/15/2018, 11:46 AM

## 2018-03-15 NOTE — Progress Notes (Signed)
Report called to RN-pt being transferred to 1414.

## 2018-03-16 NOTE — Progress Notes (Signed)
D/c to SNF awaiting  SW for direction.

## 2018-03-16 NOTE — Progress Notes (Addendum)
Clinical Social Worker following patient for discharge needs. Patient at this time has been denied from her insurance due to patient utilizing all of her SNF day benefits through her insurance. Patient is aware that the only was she would be able to discharge to facility is if patient pay privately for her stay. Patient meet with admission coordinator of [facility] at bedside. Patient spoke with [place] (admission coordinator from [employer]) and she informed patient of the amount that will be needed if patient pays out of pocket.   CSW met patient at bedside to discuss her decision about her discharge plans.Pateint stated she spoke to her brother via phone but did not speak to him about finances. CSW informed patient that she needs to play an active part in her discharge plans. CSW stated to patient that patient will need to inform her family in what she wants so CSW is able to assist in a safe discharge. CSW was given verbal permission to speak to patients sister in law Ranelle Oyster [phone]) via phone.  CSW spoke to [place] via phone to inform her of discharge plans. Hassan Rowan stated that she is aware that patient insurance will no longer pay for SNF stay. Hassan Rowan stated that unfortunately patient going home is not an option as the house is not set up to patients benefit. Hassan Rowan stated the family will have to pay privately for patient to go back to [facility]. CSW informed Hassan Rowan that $7,170 will have to be given to [facility] before patient can discharge back to facility. Hassan Rowan stated that the family will need time to get that amount of money together. Hassan Rowan stated family will need until Friday January 17th before they will be able to write check to facility. CSW informed Hassan Rowan that patient has been medically ready for discharge since Monday and that the longer she stays with a discharge order patient insurance might not cover her stay. Hassan Rowan stated she understands and will reach back out to CSW in the  morning if anything changes. CSW informed MD and RNCM of the conversation with Hassan Rowan.  Rhea Pink, MSW,  [facility]

## 2018-03-16 NOTE — Progress Notes (Signed)
CM/CSW/nurse went into patient's rm as a team-per patient permission-on speaker phone(Brenda-sister in law)-informed of d/c order 03/15/2018 Tuesday, medically stable for d/c SNF-there is a bed available,patient will incur a daily bill of over $3,000 until she leaves the hospital. CM asked if there is anything we can do to asst with the timeliness since they are accruing a bill daily-informed her of the billing dept for a possible payment plan-CSW had already provided that tel#. Sister in [place] asked if there were any hardship programs-not from CM. They had no further questions. CM has also informed her Lead CM of the case.

## 2018-03-16 NOTE — Progress Notes (Signed)
Spoke to Dr Zigmund Daniel r/t pt will not go to SNF today per SW and ok to leave IV out and off telemetry.Pt is aware.

## 2018-03-17 MED ORDER — IPRATROPIUM-ALBUTEROL 0.5-2.5 (3) MG/3ML IN SOLN
3.0000 mL | Freq: Two times a day (BID) | RESPIRATORY_TRACT | Status: DC
  Administered 2018-03-17 – 2018-03-18 (×2): 3 mL via RESPIRATORY_TRACT
  Filled 2018-03-17 (×2): qty 3

## 2018-03-17 NOTE — Progress Notes (Signed)
PROGRESS NOTE    Stephanie [name]  LKG:[id number] DOB: 1960-12-28 DOA: 03/09/2018 PCP: Patient, No Pcp Per  Brief Narrative: 58 year old female with a history of hypertension, super morbid obesity with BMI greater than 70, recent history of sepsis due to lower extremity cellulitis, acute hypoxic respiratory failure, obesity hypoventilation, who presented to [facility] ED with malaise, cough, congestion of a few days duration.  Chest x-ray notable for right basilar infiltrates.  Also presented with acute kidney injury.  Hospital course complicated by supraventricular tachycardia which resolved after resuming her home dose of metoprolol.  She also had intermediate.  Self hypertension which were proven to be inaccurate blood pressure readings.  Arterial line placed on 03/11/2018 revealed adequate blood pressures.  Line was [place] on 03/12/2018.  03/14/2018: Patient seen and examined at her bedside.  She states she feels much better and back to her baseline.  She has no new complaints.  She denies chest pain, palpitations, or dyspnea at rest.  She is eager to resume her physical therapy at SNF.   03/15/2018: Patient seen and examined at bedside.  No acute events overnight.  Insurance denied SNF placement.  She has no new complaint.  Vital signs and lab studies are unremarkable.   Assessment & Plan:   Principal Problem:   HAP (hospital-acquired pneumonia) Active Problems:   Hypertension   Morbid obesity with BMI of 70 and over, adult ([place])   AKI (acute kidney injury) ([place])   Chronic respiratory failure with hypoxia ([place])  HCAP, poa Completed 5 days ofcefepime andIV vancomycin  chest x-ray done on admission which revealed right middle lobe infiltrates Afebrile with no leukocytosis Influenza A&B negative procalcitonin 0.19 O2 supplementation to maintain O2 saturation greater than 92%  Acute on chronic hypoxic respiratory failure complicated by obesity hypoventilation syndrome Continue O2  supplementation to maintain O2 saturation greater than 92% Strongly recommend weight loss outpatient  Chronic diastolic CHF with grade 2 diastolic dysfunction Continue p.o. Lasix 40 mg twice daily Continue home cardiac medications on metoprolol and now on Lasix Strict I's and O's and daily weight  Resolving Hypotension Inaccurate blood pressures Arterial line placed in for accurate readings; pressure stable.  Discontinued arterial line on 03/12/18  ResolvedSupraventricular tachycardia Initially heart rate was elevated greater than 120 Cardiology consulted and followed Also possibly driven by acute lung physiology Continue Lopressor home dose p.o. 25 mg twice daily  Resolved AKI Creatinine improved back to her baseline Presented with creatinine of 1.98 Creatinine today 0.8back to her baseline Avoid nephrotoxic agents and hypotension Monitor urine output  Super morbid obesity with BMI 71 Strongly recommended weight loss outpatient Follow-up with PCP  Rectal bleeding with negative FOBT Noted by nursing, resolved Likely hemorrhoidal bleeding Patient states she's never had a colonoscopy Hemoglobin has been stable  Suspected dysphagia Speech therapist evaluated and recommended regular diet with thin liquids Mild risk for aspiration  Estimated body mass index is 71.23 kg/m as calculated from the following:   Height as of this encounter: 5\' 4"  (1.626 m).   Weight as of this encounter: 188.2 kg.  DVT prophylaxis: lovenox Code Status:full Family Communication:none Disposition Plan:pending snf   Consultants: none  Procedures:none Antimicrobials: none  Subjective:feels okay   Objective: Vitals:   03/17/18 0431 03/17/18 0944 03/17/18 0946 03/17/18 0947  BP: 116/70     Pulse: 85     Resp: 19     Temp: 99.2 F (37.3 C)     TempSrc: Oral     SpO2: 91% 97% 97% 97%  Weight:      Height:        Intake/Output Summary (Last 24 hours) at 03/17/2018 1323 Last  data filed at 03/17/2018 0600 Gross per 24 hour  Intake 0 ml  Output 900 ml  Net -900 ml   Filed Weights   03/09/18 0721 03/10/18 1319  Weight: (!) 196.9 kg (!) 188.2 kg    Examination:  General exam: Appears calm and comfortable  Respiratory system: Clear to auscultation. Respiratory effort normal. Cardiovascular system: S1 & S2 heard, RRR. No JVD, murmurs, rubs, gallops or clicks. No pedal edema. Gastrointestinal system: Abdomen is nondistended, soft and nontender. No organomegaly or masses felt. Normal bowel sounds heard. Central nervous system: Alert and oriented. No focal neurological deficits. Extremities: trace edema Skin: No rashes, lesions or ulcers Psychiatry: Judgement and insight appear normal. Mood & affect appropriate.     Data Reviewed: I have personally reviewed following labs and imaging studies  CBC: Recent Labs  Lab 03/11/18 0309 03/12/18 0324 03/13/18 0243 03/14/18 0259  WBC 7.0 6.6 7.3 9.8  HGB 11.6* 11.1* 11.4* 11.0*  HCT 41.2 39.3 41.0 39.5  MCV 113.8* 112.3* 112.0* 114.2*  PLT 186 196 223 010   Basic Metabolic Panel: Recent Labs  Lab 03/11/18 0309 03/12/18 0324 03/13/18 0243 03/14/18 0259  NA 142 144 147* 146*  K 5.2* 4.4 4.8 4.8  CL 106 107 109 108  CO2 28 30 32 31  GLUCOSE 114* 95 108* 103*  BUN 74* 51* 42* 38*  CREATININE 1.23* 0.98 0.84 0.91  CALCIUM 9.1 9.3 9.6 9.5   GFR: Estimated Creatinine Clearance: 117.8 mL/min (by C-G formula based on SCr of 0.91 mg/dL). Liver Function Tests: Recent Labs  Lab 03/11/18 0309  AST 17  ALT 7  ALKPHOS 42  BILITOT 0.5  PROT 6.7  ALBUMIN 2.6*   No results for input(s): LIPASE, AMYLASE in the last 168 hours. No results for input(s): AMMONIA in the last 168 hours. Coagulation Profile: No results for input(s): INR, PROTIME in the last 168 hours. Cardiac Enzymes: Recent Labs  Lab 03/13/18 1652  TROPONINI 0.03*   BNP (last 3 results) No results for input(s): PROBNP in the last 8760  hours. HbA1C: No results for input(s): HGBA1C in the last 72 hours. CBG: Recent Labs  Lab 03/10/18 2024  GLUCAP 101*   Lipid Profile: No results for input(s): CHOL, HDL, LDLCALC, TRIG, CHOLHDL, LDLDIRECT in the last 72 hours. Thyroid Function Tests: No results for input(s): TSH, T4TOTAL, FREET4, T3FREE, THYROIDAB in the last 72 hours. Anemia Panel: No results for input(s): VITAMINB12, FOLATE, FERRITIN, TIBC, IRON, RETICCTPCT in the last 72 hours. Sepsis Labs: Recent Labs  Lab 03/12/18 0324  PROCALCITON 0.19    Recent Results (from the past 240 hour(s))  Culture, sputum-assessment     Status: None   Collection Time: 03/09/18 12:43 PM  Result Value Ref Range Status   Specimen Description SPUTUM  Final   Special Requests NONE  Final   Sputum evaluation   Final    THIS SPECIMEN IS ACCEPTABLE FOR SPUTUM CULTURE Performed at [facility], [place] [address] [place], [place] [address]    Report Status 03/12/2018 FINAL  Final  Culture, respiratory     Status: None   Collection Time: 03/09/18 12:43 PM  Result Value Ref Range Status   Specimen Description   Final    SPUTUM Performed at [facility] [address] [place], [place] [address]    Special Requests  Final    NONE Reflexed from [id number] Performed at [facility], [place] [address], [place], [place] [address]    Gram Stain   Final    MODERATE WBC PRESENT, PREDOMINANTLY PMN RARE SQUAMOUS EPITHELIAL CELLS PRESENT FEW GRAM POSITIVE COCCI FEW GRAM NEGATIVE RODS    Culture   Final    FEW Consistent with normal respiratory flora. Performed at [facility], [place] [address] [place], [place] [address]    Report Status 03/14/2018 FINAL  Final  Blood culture (routine x 2)     Status: None   Collection Time: 03/09/18  1:16 PM  Result Value Ref Range Status   Specimen Description   Final    BLOOD RIGHT HAND Performed at [facility]  [address] [place], [place] [address]    Special Requests   Final    BOTTLES DRAWN AEROBIC AND ANAEROBIC Blood Culture adequate volume Performed at [facility] [address] [place], [place] [address]    Culture   Final    NO GROWTH 5 DAYS Performed at [facility], [place] [address] [place], [place] [address]    Report Status 03/14/2018 FINAL  Final  MRSA PCR Screening     Status: Abnormal   Collection Time: 03/09/18  1:23 PM  Result Value Ref Range Status   MRSA by PCR POSITIVE (A) NEGATIVE Final    Comment:        The GeneXpert MRSA Assay (FDA approved for NASAL specimens only), is one component of a comprehensive MRSA colonization surveillance program. It is not intended to diagnose MRSA infection nor to guide or monitor treatment for MRSA infections. RESULT CALLED TO, READ BACK BY AND VERIFIED WITH: Letta Moynahan [id number] @ [address] [place] Performed at [facility] [address], [place], [place] [address]   Blood culture (routine x 2)     Status: None   Collection Time: 03/09/18  1:26 PM  Result Value Ref Range Status   Specimen Description   Final    BLOOD LEFT HAND Performed at [facility] [address], [place], [place] [address]    Special Requests   Final    BOTTLES DRAWN AEROBIC AND ANAEROBIC Blood Culture adequate volume Performed at [facility] [address] [place], [place] [address]    Culture   Final    NO GROWTH 5 DAYS Performed at [facility], [place] [address] [place], [place] [address]    Report Status 03/14/2018 FINAL  Final         Radiology Studies: No results found.      Scheduled Meds: . chlorhexidine  15 mL Mouth Rinse BID  . enoxaparin (LOVENOX) injection  0.5 mg/kg Subcutaneous Q24H  . feeding supplement  1 Container Oral BID BM  . feeding supplement (ENSURE ENLIVE)  237 mL Oral TID BM  . feeding supplement (PRO-STAT SUGAR FREE  64)  30 mL Oral BID  . furosemide  40 mg Oral BID  . guaiFENesin  1,200 mg Oral BID  . ipratropium-albuterol  3 mL Nebulization BID  . mouth rinse  15 mL Mouth Rinse q12n4p  . metoprolol tartrate  25 mg Oral BID  . multivitamin with minerals  1 tablet Oral Daily  . sodium chloride flush  10-40 mL Intracatheter Q12H   Continuous Infusions: . sodium chloride 10 mL/hr at 03/12/18 1600     LOS: 8 days  Georgette Shell, MD [facility]  If 7PM-7AM, please contact night-coverage [email or web address] Password [email or web address] 03/17/2018, 1:23 PM

## 2018-03-18 NOTE — Clinical Social Work Placement (Signed)
   CLINICAL SOCIAL WORK PLACEMENT  NOTE  Date:  03/18/2018  Patient Details  Name: Stephanie [name] MRN: [id number] Date of Birth: 06/25/1960  Clinical Social Work is seeking post-discharge placement for this patient at the [place] level of care (*CSW will initial, date and re-position this form in  chart as items are completed):  Yes   Patient/family provided with [facility] Work Department's list of facilities offering this level of care within the geographic area requested by the patient (or if unable, by the patient's family).  Yes   Patient/family informed of their freedom to choose among providers that offer the needed level of care, that participate in Medicare, Medicaid or managed care program needed by the patient, have an available bed and are willing to accept the patient.  Yes   Patient/family informed of [facility]'s ownership interest in [facility] and [facility], as well as of the fact that they are under no obligation to receive care at these facilities.  PASRR submitted to EDS on       PASRR number received on       Existing PASRR number confirmed on 03/18/18     FL2 transmitted to all facilities in geographic area requested by pt/family on 03/18/18     FL2 transmitted to all facilities within larger geographic area on       Patient informed that his/her managed care company has contracts with or will negotiate with certain facilities, including the following:            Patient/family informed of bed offers received.  Patient [facility] bed at [facility]     Physician recommends and patient chooses bed at      Patient to be transferred to [facility] on 03/18/18.  Patient to be transferred to facility by ptar     Patient family notified on 03/18/18 of transfer.  Name of family member notified:  pt's brothers aware of dc     PHYSICIAN       Additional Comment:     _______________________________________________ Wende Neighbors, LCSW 03/18/2018, 2:23 PM

## 2018-03-18 NOTE — Progress Notes (Signed)
Clinical Social Worker facilitated patient discharge including contacting patient family and facility to confirm patient discharge plans.  Clinical information faxed to facility and family agreeable with plan.  CSW arranged ambulance transport via [facility] to [place].  RN to call [phone] (rm# [phone]) for report prior to discharge.  Clinical Social Worker will sign off for now as social work intervention is no longer needed. Please consult [place] again if new need arises.  Rhea Pink, MSW, [place]

## 2018-03-21 ENCOUNTER — Encounter: Payer: Self-pay | Admitting: Podiatry

## 2018-03-21 NOTE — Progress Notes (Signed)
Subjective: Stephanie [name] presents today referred by [facility] with cc of painful, discolored, thick toenails.   She is also noted to have severely thickened, dry skin on the plantar aspect of both feet.  Past Medical History:  Diagnosis Date  . Hypertension   . Obesity      Patient Active Problem List   Diagnosis Date Noted  . HAP (hospital-acquired pneumonia) 03/09/2018  . AKI (acute kidney injury) ([place]) 03/09/2018  . Chronic respiratory failure with hypoxia ([place]) 03/09/2018  . Hypertension 02/16/2018  . Morbid obesity with BMI of 70 and over, adult ([place]) 02/16/2018  . Pressure injury of skin 01/18/2018  . Sepsis ([place]) 01/01/2018     No past surgical history on file.    Current Outpatient Medications:  .  acetaminophen (TYLENOL) 500 MG tablet, Take 500-1,000 mg by mouth every 6 (six) hours as needed for headache or pain., Disp: , Rfl:  .  feeding supplement, ENSURE ENLIVE, (ENSURE ENLIVE) LIQD, Take 237 mLs by mouth 3 (three) times daily between meals. (Patient taking differently: Take 237 mLs by mouth 2 (two) times daily as needed. (Lunch/Dinner) if only ingests 50% of food), Disp: 237 mL, Rfl: 12 .  furosemide (LASIX) 40 MG tablet, Take 1 tablet (40 mg total) by mouth 2 (two) times daily., Disp: 30 tablet, Rfl: 0 .  ipratropium-albuterol (DUONEB) 0.5-2.5 (3) MG/3ML SOLN, Take 3 mLs by nebulization every 4 (four) hours as needed., Disp: 360 mL, Rfl:  .  metoprolol tartrate (LOPRESSOR) 25 MG tablet, Take 1 tablet (25 mg total) by mouth 2 (two) times daily., Disp: , Rfl:  .  polyethylene glycol (MIRALAX / GLYCOLAX) packet, Take 17 g by mouth daily as needed for mild constipation., Disp: 14 each, Rfl: 0 .  Amino Acids-Protein Hydrolys (FEEDING SUPPLEMENT, PRO-STAT SUGAR FREE 64,) LIQD, Take 30 mLs by mouth 2 (two) times daily., Disp: , Rfl:  .  bisacodyl (DULCOLAX) 10 MG suppository, Place 10 mg rectally every 4 (four) hours as needed for moderate  constipation., Disp: , Rfl:  .  ipratropium-albuterol (DUONEB) 0.5-2.5 (3) MG/3ML SOLN, Take 3 mLs by nebulization 3 (three) times daily., Disp: 360 mL, Rfl: 0 .  lactose free nutrition (BOOST) LIQD, Take 237 mLs by mouth 2 (two) times daily., Disp: , Rfl:  .  magnesium hydroxide (MILK OF MAGNESIA) 400 MG/5ML suspension, Take 30 mLs by mouth every 12 (twelve) hours as needed for mild constipation., Disp: , Rfl:  .  mineral oil-hydrophilic petrolatum (AQUAPHOR) ointment, Apply to feet daily for 90 days. (Patient not taking: Reported on 03/09/2018), Disp: 420 g, Rfl: 0 .  Multiple Vitamins-Minerals (CERTA-VITE PO), Take 1 tablet by mouth daily. Certa-vite w/ anti-oxidants for wound healing, Disp: , Rfl:  .  NONFORMULARY OR COMPOUNDED ITEM, Domeboro Solution - soak feet for 15-20 minutes weekly for 4 weeks. (Patient not taking: Reported on 03/09/2018), Disp: 4 each, Rfl: 0 .  protein supplement (RESOURCE BENEPROTEIN) 6 g POWD, Take 1 scoop by mouth daily., Disp: , Rfl:    No Known Allergies   Social History   Occupational History  . Not on file  Tobacco Use  . Smoking status: Never Smoker  . Smokeless tobacco: Never Used  Substance and Sexual Activity  . Alcohol use: Not Currently  . Drug use: Not Currently  . Sexual activity: Not Currently     Family History  Problem Relation Age of Onset  . Hypertension Mother      Immunization History  Administered Date(s)  Administered  . Influenza,inj,Quad PF,6+ Mos 01/12/2018     Review of systems: Positive Findings in bold print.  Constitutional:  chills, fatigue, fever, sweats, weight change Communication: Optometrist, sign Ecologist, hand writing, iPad/Android device Head: headaches, head injury Eyes: changes in vision, eye pain, glaucoma, cataracts, macular degeneration, diplopia, glare,  light sensitivity, eyeglasses or contacts, blindness Ears nose mouth throat: Hard of hearing, ringing in ears, deaf, sign language,  vertigo,    nosebleeds,  rhinitis,  cold sores, snoring, swollen glands Cardiovascular: HTN, edema, arrhythmia, pacemaker in place, defibrillator in place,  chest pain/tightness, chronic anticoagulation, blood clot, heart failure Peripheral Vascular: leg cramps, varicose veins, blood clots, lymphedema Respiratory:  difficulty breathing, denies congestion, SOB, wheezing, cough, emphysema Gastrointestinal: change in appetite or weight, abdominal pain, constipation, diarrhea, nausea, vomiting, vomiting blood, change in bowel habits, abdominal pain, jaundice, rectal bleeding, hemorrhoids, Genitourinary:  nocturia,  pain on urination,  blood in urine, Foley catheter, urinary urgency Musculoskeletal: uses mobility aid,  cramping, stiff joints, painful joints, decreased joint motion, fractures, OA, gout, bedbound Skin: +changes in toenails, color change, dryness, itching, mole changes,  rash  Neurological: headaches, numbness in feet, paresthesias in feet, burning in feet, fainting,  seizures, change in speech. denies headaches, memory problems/poor historian, cerebral palsy, weakness, paralysis Endocrine: diabetes, hypothyroidism, hyperthyroidism,  goiter, dry mouth, flushing, heat intolerance,  cold intolerance,  excessive thirst, denies polyuria,  nocturia Hematological:  easy bleeding, excessive bleeding, easy bruising, enlarged lymph nodes, on long term blood thinner, history of past transusions Allergy/immunological:  hives, eczema, frequent infections, multiple drug allergies, seasonal allergies, transplant recipient Psychiatric:  anxiety, depression, mood disorder, suicidal ideations, hallucinations   General: 58 y.o. AAF, morbidly obese, in NAD. AAO x 3. Objective:  Vascular Examination: Capillary refill time immediate x 10 digits Dorsalis pedis and posterior tibial pulses faintly palpable b/l No digital hair x 10 digits Skin temperature gradient WNL b/l Edema noted BLE  Dermatological  Examination: Skin extremely dry b/l LE.  Exuberant callus formation noted lateral aspect of both feet from forefoot to heels.  Toenails 1-5 b/l discolored, thick, dystrophic with subungual debris and pain with palpation to nailbeds due to thickness of nails.  Musculoskeletal: Muscle strength 3/5 to all LE muscle groups  Neurological: Sensation intact with 10 gram monofilament Vibratory sensation intact.  Assessment: 1. Painful onychomycosis toenails 1-5 b/l  2. Callus b/l feet 3. Xerosis b/l 4. Poor pedal hygiene b/l    Plan: 1. Toenails 1-5 b/l were debrided in length and girth without iatrogenic bleeding. 2. Calluses debrided b/l x 2  3. Order written for Domeboro Soaks to be performed once weekly 4. Order written for Aquaphor Ointment to be applied once daily. 5. Patient to continue soft, supportive shoe gearPatient to report any pedal injuries to medical professional immediately. 6. Follow up 3 months. Patient/POA to call should there be a concern in the interim.

## 2018-10-15 ENCOUNTER — Inpatient Hospital Stay (HOSPITAL_COMMUNITY)
Admission: EM | Admit: 2018-10-15 | Discharge: 2018-11-01 | DRG: 871 | Disposition: E | Payer: Self-pay | Attending: Family Medicine | Admitting: Family Medicine

## 2018-10-15 ENCOUNTER — Emergency Department (HOSPITAL_COMMUNITY): Payer: Self-pay

## 2018-10-15 ENCOUNTER — Inpatient Hospital Stay (HOSPITAL_COMMUNITY): Payer: Self-pay

## 2018-10-15 DIAGNOSIS — E785 Hyperlipidemia, unspecified: Secondary | ICD-10-CM | POA: Diagnosis present

## 2018-10-15 DIAGNOSIS — J9811 Atelectasis: Secondary | ICD-10-CM | POA: Diagnosis present

## 2018-10-15 DIAGNOSIS — I517 Cardiomegaly: Secondary | ICD-10-CM | POA: Diagnosis present

## 2018-10-15 DIAGNOSIS — I10 Essential (primary) hypertension: Secondary | ICD-10-CM | POA: Diagnosis present

## 2018-10-15 DIAGNOSIS — E43 Unspecified severe protein-calorie malnutrition: Secondary | ICD-10-CM | POA: Diagnosis present

## 2018-10-15 DIAGNOSIS — R6521 Severe sepsis with septic shock: Secondary | ICD-10-CM | POA: Diagnosis present

## 2018-10-15 DIAGNOSIS — G253 Myoclonus: Secondary | ICD-10-CM | POA: Diagnosis present

## 2018-10-15 DIAGNOSIS — E872 Acidosis: Secondary | ICD-10-CM | POA: Diagnosis present

## 2018-10-15 DIAGNOSIS — Z79899 Other long term (current) drug therapy: Secondary | ICD-10-CM

## 2018-10-15 DIAGNOSIS — I700 Atherosclerosis of aorta: Secondary | ICD-10-CM | POA: Diagnosis present

## 2018-10-15 DIAGNOSIS — J9690 Respiratory failure, unspecified, unspecified whether with hypoxia or hypercapnia: Secondary | ICD-10-CM

## 2018-10-15 DIAGNOSIS — I739 Peripheral vascular disease, unspecified: Secondary | ICD-10-CM | POA: Diagnosis present

## 2018-10-15 DIAGNOSIS — Z8249 Family history of ischemic heart disease and other diseases of the circulatory system: Secondary | ICD-10-CM

## 2018-10-15 DIAGNOSIS — J189 Pneumonia, unspecified organism: Secondary | ICD-10-CM | POA: Diagnosis present

## 2018-10-15 DIAGNOSIS — Z7401 Bed confinement status: Secondary | ICD-10-CM

## 2018-10-15 DIAGNOSIS — Z87440 Personal history of urinary (tract) infections: Secondary | ICD-10-CM

## 2018-10-15 DIAGNOSIS — N179 Acute kidney failure, unspecified: Secondary | ICD-10-CM | POA: Diagnosis present

## 2018-10-15 DIAGNOSIS — Z9981 Dependence on supplemental oxygen: Secondary | ICD-10-CM

## 2018-10-15 DIAGNOSIS — Z452 Encounter for adjustment and management of vascular access device: Secondary | ICD-10-CM

## 2018-10-15 DIAGNOSIS — E860 Dehydration: Secondary | ICD-10-CM | POA: Diagnosis present

## 2018-10-15 DIAGNOSIS — K760 Fatty (change of) liver, not elsewhere classified: Secondary | ICD-10-CM | POA: Diagnosis present

## 2018-10-15 DIAGNOSIS — N200 Calculus of kidney: Secondary | ICD-10-CM | POA: Diagnosis present

## 2018-10-15 DIAGNOSIS — E8809 Other disorders of plasma-protein metabolism, not elsewhere classified: Secondary | ICD-10-CM | POA: Diagnosis present

## 2018-10-15 DIAGNOSIS — E162 Hypoglycemia, unspecified: Secondary | ICD-10-CM | POA: Diagnosis present

## 2018-10-15 DIAGNOSIS — R401 Stupor: Secondary | ICD-10-CM | POA: Diagnosis present

## 2018-10-15 DIAGNOSIS — Z6841 Body Mass Index (BMI) 40.0 and over, adult: Secondary | ICD-10-CM

## 2018-10-15 DIAGNOSIS — R935 Abnormal findings on diagnostic imaging of other abdominal regions, including retroperitoneum: Secondary | ICD-10-CM | POA: Diagnosis present

## 2018-10-15 DIAGNOSIS — E7220 Disorder of urea cycle metabolism, unspecified: Secondary | ICD-10-CM | POA: Diagnosis present

## 2018-10-15 DIAGNOSIS — Z66 Do not resuscitate: Secondary | ICD-10-CM | POA: Diagnosis present

## 2018-10-15 DIAGNOSIS — Z4659 Encounter for fitting and adjustment of other gastrointestinal appliance and device: Secondary | ICD-10-CM

## 2018-10-15 DIAGNOSIS — K5790 Diverticulosis of intestine, part unspecified, without perforation or abscess without bleeding: Secondary | ICD-10-CM | POA: Diagnosis present

## 2018-10-15 DIAGNOSIS — Z515 Encounter for palliative care: Secondary | ICD-10-CM | POA: Diagnosis present

## 2018-10-15 DIAGNOSIS — R410 Disorientation, unspecified: Secondary | ICD-10-CM | POA: Diagnosis present

## 2018-10-15 DIAGNOSIS — Z20828 Contact with and (suspected) exposure to other viral communicable diseases: Secondary | ICD-10-CM | POA: Diagnosis present

## 2018-10-15 DIAGNOSIS — I129 Hypertensive chronic kidney disease with stage 1 through stage 4 chronic kidney disease, or unspecified chronic kidney disease: Secondary | ICD-10-CM | POA: Diagnosis present

## 2018-10-15 DIAGNOSIS — E662 Morbid (severe) obesity with alveolar hypoventilation: Secondary | ICD-10-CM | POA: Diagnosis present

## 2018-10-15 DIAGNOSIS — R627 Adult failure to thrive: Secondary | ICD-10-CM | POA: Insufficient documentation

## 2018-10-15 DIAGNOSIS — N170 Acute kidney failure with tubular necrosis: Secondary | ICD-10-CM | POA: Diagnosis present

## 2018-10-15 DIAGNOSIS — L304 Erythema intertrigo: Secondary | ICD-10-CM | POA: Diagnosis present

## 2018-10-15 DIAGNOSIS — D72829 Elevated white blood cell count, unspecified: Secondary | ICD-10-CM | POA: Diagnosis present

## 2018-10-15 DIAGNOSIS — A419 Sepsis, unspecified organism: Principal | ICD-10-CM | POA: Diagnosis present

## 2018-10-15 DIAGNOSIS — Y92129 Unspecified place in nursing home as the place of occurrence of the external cause: Secondary | ICD-10-CM

## 2018-10-15 DIAGNOSIS — R634 Abnormal weight loss: Secondary | ICD-10-CM | POA: Diagnosis present

## 2018-10-15 DIAGNOSIS — K219 Gastro-esophageal reflux disease without esophagitis: Secondary | ICD-10-CM | POA: Diagnosis present

## 2018-10-15 DIAGNOSIS — J9621 Acute and chronic respiratory failure with hypoxia: Secondary | ICD-10-CM | POA: Diagnosis present

## 2018-10-15 DIAGNOSIS — J9611 Chronic respiratory failure with hypoxia: Secondary | ICD-10-CM | POA: Diagnosis present

## 2018-10-15 DIAGNOSIS — D649 Anemia, unspecified: Secondary | ICD-10-CM | POA: Diagnosis present

## 2018-10-15 DIAGNOSIS — N184 Chronic kidney disease, stage 4 (severe): Secondary | ICD-10-CM | POA: Diagnosis present

## 2018-10-15 LAB — CBG MONITORING, ED
Glucose-Capillary: 67 mg/dL — ABNORMAL LOW (ref 70–99)
Glucose-Capillary: 67 mg/dL — ABNORMAL LOW (ref 70–99)
Glucose-Capillary: 98 mg/dL (ref 70–99)

## 2018-10-15 LAB — AMMONIA: Ammonia: 112 umol/L — ABNORMAL HIGH (ref 9–35)

## 2018-10-15 LAB — CBC WITH DIFFERENTIAL/PLATELET
Abs Immature Granulocytes: 0.06 10*3/uL (ref 0.00–0.07)
Basophils Absolute: 0 10*3/uL (ref 0.0–0.1)
Basophils Relative: 0 %
Eosinophils Absolute: 0 10*3/uL (ref 0.0–0.5)
Eosinophils Relative: 0 %
HCT: 26.6 % — ABNORMAL LOW (ref 36.0–46.0)
Hemoglobin: 8.6 g/dL — ABNORMAL LOW (ref 12.0–15.0)
Immature Granulocytes: 1 %
Lymphocytes Relative: 18 %
Lymphs Abs: 2 10*3/uL (ref 0.7–4.0)
MCH: 30 pg (ref 26.0–34.0)
MCHC: 32.3 g/dL (ref 30.0–36.0)
MCV: 92.7 fL (ref 80.0–100.0)
Monocytes Absolute: 1.1 10*3/uL — ABNORMAL HIGH (ref 0.1–1.0)
Monocytes Relative: 10 %
Neutro Abs: 8 10*3/uL — ABNORMAL HIGH (ref 1.7–7.7)
Neutrophils Relative %: 71 %
Platelets: 272 10*3/uL (ref 150–400)
RBC: 2.87 MIL/uL — ABNORMAL LOW (ref 3.87–5.11)
RDW: 20.6 % — ABNORMAL HIGH (ref 11.5–15.5)
WBC: 11.3 10*3/uL — ABNORMAL HIGH (ref 4.0–10.5)
nRBC: 0 % (ref 0.0–0.2)

## 2018-10-15 LAB — COMPREHENSIVE METABOLIC PANEL
ALT: 40 U/L (ref 0–44)
AST: 51 U/L — ABNORMAL HIGH (ref 15–41)
Albumin: 1.8 g/dL — ABNORMAL LOW (ref 3.5–5.0)
Alkaline Phosphatase: 123 U/L (ref 38–126)
Anion gap: 12 (ref 5–15)
BUN: 28 mg/dL — ABNORMAL HIGH (ref 6–20)
CO2: 28 mmol/L (ref 22–32)
Calcium: 8.7 mg/dL — ABNORMAL LOW (ref 8.9–10.3)
Chloride: 100 mmol/L (ref 98–111)
Creatinine, Ser: 2.96 mg/dL — ABNORMAL HIGH (ref 0.44–1.00)
GFR calc Af Amer: 20 mL/min — ABNORMAL LOW (ref >60)
GFR calc non Af Amer: 17 mL/min — ABNORMAL LOW (ref >60)
Glucose, Bld: 91 mg/dL (ref 70–99)
Potassium: 4.3 mmol/L (ref 3.5–5.1)
Sodium: 140 mmol/L (ref 135–145)
Total Bilirubin: 1.5 mg/dL — ABNORMAL HIGH (ref 0.3–1.2)
Total Protein: 6.7 g/dL (ref 6.5–8.1)

## 2018-10-15 LAB — PROTIME-INR
INR: 1.2 (ref 0.8–1.2)
Prothrombin Time: 15.5 seconds — ABNORMAL HIGH (ref 11.4–15.2)

## 2018-10-15 LAB — POCT I-STAT EG7
Acid-Base Excess: 3 mmol/L — ABNORMAL HIGH (ref 0.0–2.0)
Bicarbonate: 29.8 mmol/L — ABNORMAL HIGH (ref 20.0–28.0)
Calcium, Ion: 1.14 mmol/L — ABNORMAL LOW (ref 1.15–1.40)
HCT: 33 % — ABNORMAL LOW (ref 36.0–46.0)
Hemoglobin: 11.2 g/dL — ABNORMAL LOW (ref 12.0–15.0)
O2 Saturation: 59 %
Potassium: 4.3 mmol/L (ref 3.5–5.1)
Sodium: 140 mmol/L (ref 135–145)
TCO2: 32 mmol/L (ref 22–32)
pCO2, Ven: 57.1 mmHg (ref 44.0–60.0)
pH, Ven: 7.326 (ref 7.250–7.430)
pO2, Ven: 34 mmHg (ref 32.0–45.0)

## 2018-10-15 LAB — LACTIC ACID, PLASMA
Lactic Acid, Venous: 2.4 mmol/L (ref 0.5–1.9)
Lactic Acid, Venous: 3.1 mmol/L (ref 0.5–1.9)
Lactic Acid, Venous: 2.3 mmol/L (ref 0.5–1.9)

## 2018-10-15 LAB — I-STAT CHEM 8, ED
BUN: 35 mg/dL — ABNORMAL HIGH (ref 6–20)
Calcium, Ion: 1.14 mmol/L — ABNORMAL LOW (ref 1.15–1.40)
Chloride: 99 mmol/L (ref 98–111)
Creatinine, Ser: 2.7 mg/dL — ABNORMAL HIGH (ref 0.44–1.00)
Glucose, Bld: 81 mg/dL (ref 70–99)
HCT: 32 % — ABNORMAL LOW (ref 36.0–46.0)
Hemoglobin: 10.9 g/dL — ABNORMAL LOW (ref 12.0–15.0)
Potassium: 4.3 mmol/L (ref 3.5–5.1)
Sodium: 139 mmol/L (ref 135–145)
TCO2: 30 mmol/L (ref 22–32)

## 2018-10-15 LAB — SARS CORONAVIRUS 2 BY RT PCR (HOSPITAL ORDER, PERFORMED IN ~~LOC~~ HOSPITAL LAB): SARS Coronavirus 2: NEGATIVE

## 2018-10-15 LAB — CK: Total CK: 143 U/L (ref 38–234)

## 2018-10-15 MED ORDER — ENOXAPARIN SODIUM 40 MG/0.4ML ~~LOC~~ SOLN
40.0000 mg | SUBCUTANEOUS | Status: DC
  Administered 2018-10-16 (×2): 40 mg via SUBCUTANEOUS
  Filled 2018-10-15 (×2): qty 0.4

## 2018-10-15 MED ORDER — VANCOMYCIN HCL 10 G IV SOLR: 1500.0000 mg | INTRAVENOUS | Status: DC

## 2018-10-15 MED ORDER — METOPROLOL TARTRATE 5 MG/5ML IV SOLN
2.5000 mg | Freq: Four times a day (QID) | INTRAVENOUS | Status: DC
  Administered 2018-10-16: 2.5 mg via INTRAVENOUS
  Filled 2018-10-15: qty 5

## 2018-10-15 MED ORDER — METOPROLOL TARTRATE 25 MG PO TABS: 25.0000 mg | ORAL_TABLET | Freq: Two times a day (BID) | ORAL | Status: DC

## 2018-10-15 MED ORDER — FAMOTIDINE 20 MG PO TABS: 20.0000 mg | ORAL_TABLET | Freq: Every day | ORAL | Status: DC

## 2018-10-15 MED ORDER — SODIUM CHLORIDE 0.9 % IV BOLUS
1000.0000 mL | Freq: Once | INTRAVENOUS | Status: AC
  Administered 2018-10-15: 17:00:00 1000 mL via INTRAVENOUS

## 2018-10-15 MED ORDER — POLYETHYLENE GLYCOL 3350 17 G PO PACK: 17.0000 g | PACK | Freq: Every day | ORAL | Status: DC | PRN

## 2018-10-15 MED ORDER — CALCIUM POLYCARBOPHIL 625 MG PO TABS
625.0000 mg | ORAL_TABLET | Freq: Every day | ORAL | Status: DC
  Filled 2018-10-15: qty 1

## 2018-10-15 MED ORDER — SODIUM CHLORIDE 0.9 % IV SOLN: 2.0000 g | Freq: Once | INTRAVENOUS | Status: DC

## 2018-10-15 MED ORDER — POLYETHYLENE GLYCOL 3350 17 G PO PACK: 7.5000 g | PACK | ORAL | Status: DC

## 2018-10-15 MED ORDER — ACETAMINOPHEN 650 MG RE SUPP: 650.0000 mg | Freq: Four times a day (QID) | RECTAL | Status: DC | PRN

## 2018-10-15 MED ORDER — GABAPENTIN 100 MG PO CAPS: 200.0000 mg | ORAL_CAPSULE | Freq: Every day | ORAL | Status: DC

## 2018-10-15 MED ORDER — SODIUM CHLORIDE 0.9 % IV SOLN
Freq: Once | INTRAVENOUS | Status: AC
  Administered 2018-10-15: 18:00:00 via INTRAVENOUS

## 2018-10-15 MED ORDER — SODIUM CHLORIDE 0.9 % IV SOLN
INTRAVENOUS | Status: DC
  Administered 2018-10-15 – 2018-10-16 (×2): via INTRAVENOUS

## 2018-10-15 MED ORDER — SODIUM CHLORIDE 0.9 % IV SOLN
2.0000 g | Freq: Two times a day (BID) | INTRAVENOUS | Status: DC
  Administered 2018-10-16: 2 g via INTRAVENOUS
  Filled 2018-10-15: qty 2

## 2018-10-15 MED ORDER — GLUCOSE 40 % PO GEL
1.0000 | Freq: Once | ORAL | Status: AC
  Administered 2018-10-15: 12:00:00 37.5 g via ORAL
  Filled 2018-10-15: qty 1

## 2018-10-15 MED ORDER — VANCOMYCIN HCL 10 G IV SOLR
2500.0000 mg | Freq: Once | INTRAVENOUS | Status: AC
  Administered 2018-10-16: 2500 mg via INTRAVENOUS
  Filled 2018-10-15: qty 2500

## 2018-10-15 MED ORDER — DEXTROSE 50 % IV SOLN
50.0000 mL | Freq: Once | INTRAVENOUS | Status: AC
  Administered 2018-10-15: 50 mL via INTRAVENOUS
  Filled 2018-10-15: qty 50

## 2018-10-15 MED ORDER — ACETAMINOPHEN 325 MG PO TABS: 650.0000 mg | ORAL_TABLET | Freq: Four times a day (QID) | ORAL | Status: DC | PRN

## 2018-10-15 NOTE — Progress Notes (Signed)
Pharmacy Antibiotic Note  Stephanie [name] is a 58 y.o. female admitted on 10/14/2018 with wound infection.  Pharmacy has been consulted for Vancomycin and Cefepime dosing for suspected sepsis secondary to abdominal wounds.   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  Goal trough 15-20 mcg/mL.     Goal AUC 400-550       Expected AUC: 507    Monitor vanc troughs and peaks as needed Cefepime 2g IV every 12 hours Will likely need to dose adjust as kidney function changes F/u length of therapy and narrow therapy based on cultures  Weight: (!) 309 lb 11.9 oz (140.5 kg)  Temp (24hrs), Avg:98.7 F (37.1 C), Min:98.7 F (37.1 C), Max:98.7 F (37.1 C)  Recent Labs  Lab 10/26/2018 1251 10/08/2018 1505 10/02/2018 1521  WBC  --  11.3*  --   CREATININE  --  2.96* 2.70*  LATICACIDVEN 2.4* 3.1*  --     Estimated Creatinine Clearance: 32.7 mL/min (A) (by C-G formula based on SCr of 2.7 mg/dL (H)).    No Known Allergies  Antimicrobials this admission: 8/15 Cefepime>> 8/15 Vanc>>  Dose adjustments this admission: N/A  Microbiology results: 8/15 bc x2: pending 8/15 urine cx: pending  Thank you for allowing pharmacy to be a part of this patient's care.  Kennon Holter, PharmD PGY1 Ambulatory Care Pharmacy Resident [employer] Phone: [phone]' 10/27/2018 9:22 PM

## 2018-10-15 NOTE — ED Provider Notes (Signed)
[place] EMERGENCY DEPARTMENT Provider Note   CSN: [id number] Arrival date & time: 10/18/2018  1059    History   Chief Complaint Chief Complaint  Patient presents with  . Fatigue  . Altered Mental Status    HPI Stephanie [name] is a 58 y.o. female.     The history is provided by the patient.  Altered Mental Status Presenting symptoms: lethargy   Severity:  Mild Most recent episode:  Today Episode history:  Single Timing:  Intermittent Progression:  Waxing and waning Chronicity:  New Context: nursing home resident   Context comment:  Chronic respiratory failure on 2L of 02, morbid obestiy, sent for increase lethargy Associated symptoms: no abdominal pain, no depression, no fever, no headaches, no nausea, no palpitations, no rash, no seizures, no vomiting and no weakness     Past Medical History:  Diagnosis Date  . Hypertension   . Obesity     Patient Active Problem List   Diagnosis Date Noted  . HAP (hospital-acquired pneumonia) 03/09/2018  . AKI (acute kidney injury) ([place]) 03/09/2018  . Chronic respiratory failure with hypoxia ([place]) 03/09/2018  . Hypertension 02/16/2018  . Morbid obesity with BMI of 70 and over, adult ([place]) 02/16/2018  . Pressure injury of skin 01/18/2018  . Sepsis ([name]) 01/01/2018    No past surgical history on file.   OB History   No obstetric history on file.      Home Medications    Prior to Admission medications   Medication Sig Start Date End Date Taking? Authorizing Provider  acetaminophen (TYLENOL) 500 MG tablet Take 500-1,000 mg by mouth every 6 (six) hours as needed for headache or pain.    [provider]  Amino Acids-Protein Hydrolys (FEEDING SUPPLEMENT, PRO-STAT SUGAR FREE 64,) LIQD Take 30 mLs by mouth 2 (two) times daily.    [provider]  bisacodyl (DULCOLAX) 10 MG suppository Place 10 mg rectally every 4 (four) hours as needed for moderate constipation.    [provider]   feeding supplement, ENSURE ENLIVE, (ENSURE ENLIVE) LIQD Take 237 mLs by mouth 3 (three) times daily between meals. Patient taking differently: Take 237 mLs by mouth 2 (two) times daily as needed. (Lunch/Dinner) if only ingests 50% of food 01/17/18   Bettey Costa, MD  furosemide (LASIX) 40 MG tablet Take 1 tablet (40 mg total) by mouth 2 (two) times daily. 01/17/18   Bettey Costa, MD  ipratropium-albuterol (DUONEB) 0.5-2.5 (3) MG/3ML SOLN Take 3 mLs by nebulization every 4 (four) hours as needed. 01/17/18   Bettey Costa, MD  ipratropium-albuterol (DUONEB) 0.5-2.5 (3) MG/3ML SOLN Take 3 mLs by nebulization 3 (three) times daily. 03/15/18   Kayleen Memos, DO  lactose free nutrition (BOOST) LIQD Take 237 mLs by mouth 2 (two) times daily.    [provider]  magnesium hydroxide (MILK OF MAGNESIA) 400 MG/5ML suspension Take 30 mLs by mouth every 12 (twelve) hours as needed for mild constipation.    [provider]  metoprolol tartrate (LOPRESSOR) 25 MG tablet Take 1 tablet (25 mg total) by mouth 2 (two) times daily. 01/17/18   Bettey Costa, MD  mineral oil-hydrophilic petrolatum (AQUAPHOR) ointment Apply to feet daily for 90 days. Patient not taking: Reported on 03/09/2018 02/17/18   Marzetta Board, DPM  Multiple Vitamins-Minerals (CERTA-VITE PO) Take 1 tablet by mouth daily. Certa-vite w/ anti-oxidants for wound healing    [provider]  NONFORMULARY OR COMPOUNDED ITEM Domeboro Solution - soak feet for 15-20  minutes weekly for 4 weeks. Patient not taking: Reported on 03/09/2018 02/17/18   Marzetta Board, DPM  polyethylene glycol (MIRALAX / GLYCOLAX) packet Take 17 g by mouth daily as needed for mild constipation. 01/17/18   Bettey Costa, MD  protein supplement (RESOURCE BENEPROTEIN) 6 g POWD Take 1 scoop by mouth daily.    [provider]    Family History Family History  Problem Relation Age of Onset  . Hypertension Mother     Social History Social History    Tobacco Use  . Smoking status: Never Smoker  . Smokeless tobacco: Never Used  Substance Use Topics  . Alcohol use: Not Currently  . Drug use: Not Currently     Allergies   Patient has no known allergies.   Review of Systems Review of Systems  Constitutional: Positive for fatigue. Negative for chills and fever.  HENT: Negative for ear pain and sore throat.   Eyes: Negative for pain and visual disturbance.  Respiratory: Negative for cough and shortness of breath.   Cardiovascular: Negative for chest pain and palpitations.  Gastrointestinal: Negative for abdominal pain, nausea and vomiting.  Genitourinary: Negative for dysuria and hematuria.  Musculoskeletal: Negative for arthralgias and back pain.  Skin: Positive for wound (chronic super wounds to low back). Negative for color change and rash.  Neurological: Negative for dizziness, tremors, seizures, syncope, speech difficulty, weakness, numbness and headaches.  All other systems reviewed and are negative.    Physical Exam Updated Vital Signs  ED Triage Vitals [10/10/2018 1102]  Enc Vitals Group     BP (!) 164/100     Pulse Rate (!) 101     Resp 18     Temp 98.7 F (37.1 C)     Temp Source Oral     SpO2 94 %     Weight      Height      Head Circumference      Peak Flow      Pain Score      Pain Loc      Pain Edu?      Excl. in [place]?     Physical Exam Vitals signs and nursing note reviewed.  Constitutional:      General: She is not in acute distress.    Appearance: She is well-developed. She is obese. She is not ill-appearing.  HENT:     Head: Normocephalic and atraumatic.     Nose: Nose normal.     Mouth/Throat:     Mouth: Mucous membranes are moist.  Eyes:     Extraocular Movements: Extraocular movements intact.     Conjunctiva/sclera: Conjunctivae normal.     Pupils: Pupils are equal, round, and reactive to light.  Neck:     Musculoskeletal: Neck supple.  Cardiovascular:     Rate and Rhythm: Normal  rate and regular rhythm.     Pulses: Normal pulses.     Heart sounds: Normal heart sounds. No murmur.  Pulmonary:     Effort: Pulmonary effort is normal.     Comments: Coarse breath sounds throughout Abdominal:     General: There is no distension.     Palpations: Abdomen is soft.     Tenderness: There is no abdominal tenderness.  Musculoskeletal: Normal range of motion.  Skin:    General: Skin is warm and dry.     Capillary Refill: Capillary refill takes less than 2 seconds.     Comments: Superficial sacral wound with no depth to them  Neurological:     General: No focal deficit present.     Mental Status: She is alert and oriented to person, place, and time.     Cranial Nerves: No cranial nerve deficit.  Psychiatric:        Mood and Affect: Mood normal.      ED Treatments / Results  Labs (all labs ordered are listed, but only abnormal results are displayed) Labs Reviewed  LACTIC ACID, PLASMA - Abnormal; Notable for the following components:      Result Value   Lactic Acid, Venous 2.4 (*)    All other components within normal limits  CBC WITH DIFFERENTIAL/PLATELET - Abnormal; Notable for the following components:   WBC 11.3 (*)    RBC 2.87 (*)    Hemoglobin 8.6 (*)    HCT 26.6 (*)    RDW 20.6 (*)    Neutro Abs 8.0 (*)    Monocytes Absolute 1.1 (*)    All other components within normal limits  CBG MONITORING, ED - Abnormal; Notable for the following components:   Glucose-Capillary 67 (*)    All other components within normal limits  CBG MONITORING, ED - Abnormal; Notable for the following components:   Glucose-Capillary 67 (*)    All other components within normal limits  I-STAT CHEM 8, ED - Abnormal; Notable for the following components:   BUN 35 (*)    Creatinine, Ser 2.70 (*)    Calcium, Ion 1.14 (*)    Hemoglobin 10.9 (*)    HCT 32.0 (*)    All other components within normal limits  CULTURE, BLOOD (ROUTINE X 2)  CULTURE, BLOOD (ROUTINE X 2)  URINE CULTURE   SARS CORONAVIRUS 2 (HOSPITAL ORDER, [facility])  LACTIC ACID, PLASMA  CBC WITH DIFFERENTIAL/PLATELET  URINALYSIS, ROUTINE W REFLEX MICROSCOPIC  CK  COMPREHENSIVE METABOLIC PANEL  I-STAT VENOUS BLOOD GAS, ED  POCT I-STAT EG7    EKG EKG Interpretation  Date/Time:  Saturday October 15 2018 11:01:00 EDT Ventricular Rate:  101 PR Interval:    QRS Duration: 73 QT Interval:  352 QTC Calculation: 457 R Axis:   -7 Text Interpretation:  Sinus tachycardia Ventricular premature complex Left atrial enlargement Confirmed by Lennice Sites [phone]) on 10/16/2018 12:28:38 PM   Radiology Dg Chest Port 1 View  Result Date: 10/30/2018 CLINICAL DATA:  Altered mental status. Fatigue. EXAM: PORTABLE CHEST 1 VIEW COMPARISON:  03/13/2018. FINDINGS: Stable enlarged cardiac silhouette. Right basilar atelectasis or scarring mild improvement. Otherwise, clear lungs. Thoracic spine degenerative changes. IMPRESSION: 1. Right basilar atelectasis or scarring with mild improvement. 2. Stable cardiomegaly. Electronically Signed   By: Claudie Revering M.D.   On: 10/08/2018 13:07    Procedures Procedures (including critical care time)  Medications Ordered in ED Medications  dextrose 50 % solution 50 mL (has no administration in time range)  sodium chloride 0.9 % bolus 1,000 mL (has no administration in time range)  dextrose (GLUTOSE) 40 % oral gel 37.5 g (37.5 g Oral Given 10/02/2018 1145)   EMERGENCY DEPARTMENT  US GUIDANCE EXAM Emergency Ultrasound:  US Guidance for Needle Guidance  INDICATIONS: Difficult vascular access Linear probe used in real-time to visualize location of needle entry through skin.   PERFORMED BY: Myself IMAGES ARCHIVED?: No LIMITATIONS: Pain and body habitus VIEWS USED: Transverse INTERPRETATION: Left arm  Initial Impression / Assessment and Plan / ED Course  I have reviewed the triage vital signs and the nursing notes.  Pertinent labs & imaging  results that  were available during my care of the patient were reviewed by me and considered in my medical decision making (see chart for details).     Stephanie [name] is a 58 year old female with history of obesity, chronic hypoxic respiratory failure on 2 L of oxygen, hypertension who presents the ED with lethargy.  Patient with overall unremarkable vitals.  Normal temperature rectally.  Normal oxygenation on 2 L of oxygen.  Patient overall does not give any specific complaints.  She seems to be withdrawn.  She moves all extremities.  She converse states with me but intermittently falls asleep.  Pupils are equal.  Blood sugar is 67 and will give oral glucose.  Per nursing home patient has been refusing to eat or drink for the last several weeks to months.  She has had a slow decline during this time needing multiple treatments with IV fluids.  She refuses to wear her CPAP at night.  Patient is a DNR but hospice has not been involved.  Talk to multiple family members who state that she has been refusing to eat for weeks and months.  She has had significant weight loss.  She has no major ulcers or signs to suggest cellulitis on exam.  She has a history of UTIs in the past.  She has been a very difficult IV access in the past as well.  I have placed multiple IV peripherally.    First set of blood tests clotted and had to be re-collected.  Each ultrasound IV has eventually failed.  Patient has been signed out to oncoming ED staff with lab work pending based off of my last IV stick that was able to get labs.  I-STAT labs seem to suggest dehydration with creatinine at 2.7.  Patient with mild leukocytosis.  Chest x-ray shows no signs of infection.  Lactic acid mildly elevated at 2.4.  Overall, patient possibly dehydrated due to poor p.o. intake.  This could be in the setting of chronic decompensation.  Hemoglobin was 8.6.  Patient had brown stool on exam.  Low concern for acute GI bleed at this time. No obvious sign of  cellulitis, no major ulcers.  Patient handed off to oncoming ED staff with remaining lab work pending.  Anticipate admission for hydration.  Patient would benefit from hospice conversation to discuss what her future care should entail.  Conversation should be also made about possible long-term PICC line placement as well if she is going to need IV fluids long-term as well.  Urinalysis to be collected.  No concern for sepsis at this time.  This chart was dictated using voice recognition software.  Despite best efforts to proofread,  errors can occur which can change the documentation meaning.    Final Clinical Impressions(s) / ED Diagnoses   Final diagnoses:  AKI (acute kidney injury) [facility])  Confusion    ED Discharge Orders    None       Lennice Sites, DO 10/25/2018 1547

## 2018-10-15 NOTE — ED Triage Notes (Addendum)
Pt arrives with [facility] from [place] c/o feeling lethargic, and AMS. Pt is very somnolent; per EMS, the facility states the pt's swelling in her right arm started today. Pt reportedly 400 lbs and is now 310 lb; per EMS the pt is malnourished and is not eating. Pt's hx includes CKD, resp failure, peripheral vasc dx, rhabdo, and has pressure injuries.  EMS vitals: 111/67 HR 88 99.6 temp

## 2018-10-15 NOTE — ED Notes (Signed)
ED Provider at bedside. 

## 2018-10-15 NOTE — ED Notes (Signed)
ED TO INPATIENT HANDOFF REPORT  ED Nurse Name and Phone #: Percell Locus, RN/ Mylan, RN  S Name/Age/Gender Stephanie [name] 58 y.o. female Room/Bed: 028C/028C  Code Status   Code Status: Prior  Home/SNF/Other Nursing Home Patient oriented to: self Is this baseline? Yes   Triage Complete: Triage complete  Chief Complaint lethargy  Triage Note Pt arrives with [facility] from [facility] c/o feeling lethargic, and AMS. Pt is very somnolent; per EMS, the facility states the pt's swelling in her right arm started today. Pt reportedly 400 lbs and is now 310 lb; per EMS the pt is malnourished and is not eating. Pt's hx includes CKD, resp failure, peripheral vasc dx, rhabdo, and has pressure injuries.  EMS vitals: 111/67 HR 88 99.6 temp   Allergies No Known Allergies  Level of Care/Admitting Diagnosis ED Disposition    ED Disposition Condition [facility] Area: [place] [[id number]]  Level of Care: Med-Surg [16]  Covid Evaluation: Asymptomatic Screening Protocol (No Symptoms)  [facility]: [id number]  Admitting Physician: Wilber Oliphant [id number]  Attending Physician: MCDIARMID, TODD D [[id number]]  Estimated length of stay: past midnight tomorrow  Certification:: I certify this patient will need inpatient services for at least 2 midnights  PT Class (Do Not Modify): Inpatient [101]  PT Acc Code (Do Not Modify): Private [1]       B Medical/Surgery History Past Medical History:  Diagnosis Date  . Hypertension   . Obesity    No past surgical history on file.   A IV Location/Drains/Wounds Patient Lines/Drains/Airways Status   Active Line/Drains/Airways    Name:   Placement date:   Placement time:   Site:   Days:   Peripheral IV 10/23/2018 Left Antecubital   10/27/2018    1512    Antecubital   less than 1   Peripheral IV 10/02/2018 Left Forearm   10/04/2018    1836    Forearm   less than 1   External Urinary Catheter   03/15/18     1400    -   214   Wound / Incision (Open or Dehisced) 01/12/18 Abdomen Left;Lower;Medial   01/12/18    -    Abdomen   276          Intake/Output Last 24 hours  Intake/Output Summary (Last 24 hours) at 10/07/2018 1924 Last data filed at 10/12/2018 1747 Gross per 24 hour  Intake 500 ml  Output -  Net 500 ml    Labs/Imaging Results for orders placed or performed during the hospital encounter of 10/26/2018 (from the past 48 hour(s))  CBG monitoring, ED     Status: Abnormal   Collection Time: 10/28/2018 11:04 AM  Result Value Ref Range   Glucose-Capillary 67 (L) 70 - 99 mg/dL   Comment 1 Notify RN    Comment 2 Document in Chart   Lactic acid, plasma     Status: Abnormal   Collection Time: 10/03/2018 12:51 PM  Result Value Ref Range   Lactic Acid, Venous 2.4 (HH) 0.5 - 1.9 mmol/L    Comment: CRITICAL RESULT CALLED TO, READ BACK BY AND VERIFIED WITH: N.Srah Ake,RN @ 1350 10/08/2018 [place] Performed at [facility], [place] [address] [place], [place] [address]   CBG monitoring, ED     Status: Abnormal   Collection Time: 10/10/2018  2:08 PM  Result Value Ref Range   Glucose-Capillary 67 (L) 70 - 99 mg/dL   Comment 1  Notify RN    Comment 2 Document in Chart   Lactic acid, plasma     Status: Abnormal   Collection Time: 10/18/2018  3:05 PM  Result Value Ref Range   Lactic Acid, Venous 3.1 (HH) 0.5 - 1.9 mmol/L    Comment: CRITICAL RESULT CALLED TO, READ BACK BY AND VERIFIED WITH: REABARB, A RN @ 1552 ON 10/30/2018 BY TEMOCHE, H Performed at [facility], [address] [place], [place] [address]   CBC with Differential/Platelet     Status: Abnormal   Collection Time: 10/18/2018  3:05 PM  Result Value Ref Range   WBC 11.3 (H) 4.0 - 10.5 K/uL   RBC 2.87 (L) 3.87 - 5.11 MIL/uL   Hemoglobin 8.6 (L) 12.0 - 15.0 g/dL   HCT 26.6 (L) 36.0 - 46.0 %   MCV 92.7 80.0 - 100.0 fL   MCH 30.0 26.0 - 34.0 pg   MCHC 32.3 30.0 - 36.0 g/dL   RDW 20.6 (H) 11.5 - 15.5 %   Platelets 272 150 - 400  K/uL   nRBC 0.0 0.0 - 0.2 %   Neutrophils Relative % 71 %   Neutro Abs 8.0 (H) 1.7 - 7.7 K/uL   Lymphocytes Relative 18 %   Lymphs Abs 2.0 0.7 - 4.0 K/uL   Monocytes Relative 10 %   Monocytes Absolute 1.1 (H) 0.1 - 1.0 K/uL   Eosinophils Relative 0 %   Eosinophils Absolute 0.0 0.0 - 0.5 K/uL   Basophils Relative 0 %   Basophils Absolute 0.0 0.0 - 0.1 K/uL   Immature Granulocytes 1 %   Abs Immature Granulocytes 0.06 0.00 - 0.07 K/uL    Comment: Performed at [facility], [place] [address] [place], [place] [address]  CK     Status: None   Collection Time: 10/22/2018  3:05 PM  Result Value Ref Range   Total CK 143 38 - 234 U/L    Comment: Performed at [facility], [place] [address] [place], [place] [address]  Comprehensive metabolic panel     Status: Abnormal   Collection Time: 10/10/2018  3:05 PM  Result Value Ref Range   Sodium 140 135 - 145 mmol/L   Potassium 4.3 3.5 - 5.1 mmol/L   Chloride 100 98 - 111 mmol/L   CO2 28 22 - 32 mmol/L   Glucose, Bld 91 70 - 99 mg/dL   BUN 28 (H) 6 - 20 mg/dL   Creatinine, Ser 2.96 (H) 0.44 - 1.00 mg/dL   Calcium 8.7 (L) 8.9 - 10.3 mg/dL   Total Protein 6.7 6.5 - 8.1 g/dL   Albumin 1.8 (L) 3.5 - 5.0 g/dL   AST 51 (H) 15 - 41 U/L   ALT 40 0 - 44 U/L   Alkaline Phosphatase 123 38 - 126 U/L   Total Bilirubin 1.5 (H) 0.3 - 1.2 mg/dL   GFR calc non Af Amer 17 (L) >60 mL/min   GFR calc Af Amer 20 (L) >60 mL/min   Anion gap 12 5 - 15    Comment: Performed at [facility], [place] [address] [place], [place] [address]  POCT I-Stat EG7     Status: Abnormal   Collection Time: 10/18/2018  3:20 PM  Result Value Ref Range   pH, Ven 7.326 7.250 - 7.430   pCO2, Ven 57.1 44.0 - 60.0 mmHg   pO2, Ven 34.0 32.0 - 45.0 mmHg   Bicarbonate 29.8 (H) 20.0 - 28.0 mmol/L  TCO2 32 22 - 32 mmol/L   O2 Saturation 59.0 %   Acid-Base Excess 3.0 (H) 0.0 - 2.0 mmol/L   Sodium 140 135 - 145 mmol/L   Potassium 4.3 3.5 - 5.1 mmol/L   Calcium, Ion 1.14 (L) 1.15  - 1.40 mmol/L   HCT 33.0 (L) 36.0 - 46.0 %   Hemoglobin 11.2 (L) 12.0 - 15.0 g/dL   Patient temperature HIDE    Sample type VENOUS    Comment NOTIFIED PHYSICIAN   I-stat chem 8, ED (not at [facility] or [facility])     Status: Abnormal   Collection Time: 10/23/2018  3:21 PM  Result Value Ref Range   Sodium 139 135 - 145 mmol/L   Potassium 4.3 3.5 - 5.1 mmol/L   Chloride 99 98 - 111 mmol/L   BUN 35 (H) 6 - 20 mg/dL   Creatinine, Ser 2.70 (H) 0.44 - 1.00 mg/dL   Glucose, Bld 81 70 - 99 mg/dL   Calcium, Ion 1.14 (L) 1.15 - 1.40 mmol/L   TCO2 30 22 - 32 mmol/L   Hemoglobin 10.9 (L) 12.0 - 15.0 g/dL   HCT 32.0 (L) 36.0 - 46.0 %  SARS Coronavirus 2 [facility] order, Performed in [facility] hospital lab) Nasopharyngeal Nasopharyngeal Swab     Status: None   Collection Time: 10/02/2018  6:16 PM   Specimen: Nasopharyngeal Swab  Result Value Ref Range   SARS Coronavirus 2 NEGATIVE NEGATIVE    Comment: (NOTE) If result is NEGATIVE SARS-CoV-2 target nucleic acids are NOT DETECTED. The SARS-CoV-2 RNA is generally detectable in upper and lower  respiratory specimens during the acute phase of infection. The lowest  concentration of SARS-CoV-2 viral copies this assay can detect is 250  copies / mL. A negative result does not preclude SARS-CoV-2 infection  and should not be used as the sole basis for treatment or other  patient management decisions.  A negative result may occur with  improper specimen collection / handling, submission of specimen other  than nasopharyngeal swab, presence of viral mutation(s) within the  areas targeted by this assay, and inadequate number of viral copies  (<250 copies / mL). A negative result must be combined with clinical  observations, patient history, and epidemiological information. If result is POSITIVE SARS-CoV-2 target nucleic acids are DETECTED. The SARS-CoV-2 RNA is generally detectable in upper and lower  respiratory specimens dur ing the acute phase of infection.   Positive  results are indicative of active infection with SARS-CoV-2.  Clinical  correlation with patient history and other diagnostic information is  necessary to determine patient infection status.  Positive results do  not rule out bacterial infection or co-infection with other viruses. If result is PRESUMPTIVE POSTIVE SARS-CoV-2 nucleic acids MAY BE PRESENT.   A presumptive positive result was obtained on the submitted specimen  and confirmed on repeat testing.  While 2019 novel coronavirus  (SARS-CoV-2) nucleic acids may be present in the submitted sample  additional confirmatory testing may be necessary for epidemiological  and / or clinical management purposes  to differentiate between  SARS-CoV-2 and other Sarbecovirus currently known to infect humans.  If clinically indicated additional testing with an alternate test  methodology [phone]) is advised. The SARS-CoV-2 RNA is generally  detectable in upper and lower respiratory sp ecimens during the acute  phase of infection. The expected result is Negative. Fact Sheet for Patients:  StrictlyIdeas.no Fact Sheet for Healthcare Providers: BankingDealers.co.za This test is not yet approved or cleared by the  [place] Architectural technologist and has been authorized for detection and/or diagnosis of SARS-CoV-2 by FDA under an Print production planner (EUA).  This EUA will remain in effect (meaning this test can be used) for the duration of the COVID-19 declaration under Section 564(b)(1) of the Act, 21 U.S.C. section 360bbb-3(b)(1), unless the authorization is terminated or revoked sooner. Performed at [facility], [place] [address], [place], [place] [address]    Ct Abdomen Pelvis Wo Contrast  Result Date: 10/17/2018 CLINICAL DATA:  Abdominal pain.  Fever.  Abscess suspected. EXAM: CT ABDOMEN AND PELVIS WITHOUT CONTRAST TECHNIQUE: Multidetector CT imaging of the abdomen and pelvis was performed  following the standard protocol without IV contrast. COMPARISON:  None. FINDINGS: Lower chest: There is atelectasis versus consolidation of the lung bases.The heart size is enlarged. Hepatobiliary: There is decreased hepatic attenuation suggestive of hepatic steatosis. There is hyperdense material throughout the gallbladder lumen which may represent gallbladder sludge.There is no biliary ductal dilation. Evaluation is limited by patient body habitus and streak artifact from the patient's arms. Pancreas: Normal contours without ductal dilatation. No peripancreatic fluid collection. Spleen: No splenic laceration or hematoma. Adrenals/Urinary Tract: --Adrenal glands: No adrenal hemorrhage. --Right kidney/ureter: There are nonobstructing stones in the lower pole the right kidney. There is no hydronephrosis. --Left kidney/ureter: There are nonobstructing stones in the lower pole the left kidney. There is no hydronephrosis. --Urinary bladder: The bladder is decompressed which limits evaluation. Stomach/Bowel: --Stomach/Duodenum: No hiatal hernia or other gastric abnormality. Normal duodenal course and caliber. --Small bowel: No dilatation or inflammation. --Colon: Rectosigmoid diverticulosis without acute inflammation. --Appendix: Normal. Vascular/Lymphatic: Atherosclerotic calcification is present within the non-aneurysmal abdominal aorta, without hemodynamically significant stenosis. --No retroperitoneal lymphadenopathy. --No mesenteric lymphadenopathy. --No pelvic or inguinal lymphadenopathy. Reproductive: Unremarkable Other: No ascites or free air. There is a hyperdense collection in the periumbilical region measuring approximately 8.3 by 2 cm. There are adjacent calcifications without evidence of significant surrounding fat stranding. This is likely a chronic finding. An abscess seems much less likely. Musculoskeletal. No acute displaced fractures. IMPRESSION: 1. Examination limited by patient body habitus and  significant streak artifact from the patient's arms. 2. Severe hepatic steatosis. 3. Bilateral nonobstructing renal nephroliths. 4. Probable chronic periumbilical fluid collection without definite CT evidence for an abscess. 5. Cardiomegaly. 6. Bibasilar airspace opacities favored to represent atelectasis. An infiltrate is not excluded and should be correlated clinically. 7.  Aortic Atherosclerosis (ICD10-I70.0). Electronically Signed   By: Constance Holster M.D.   On: 10/18/2018 18:27   Dg Chest Port 1 View  Result Date: 10/17/2018 CLINICAL DATA:  Altered mental status. Fatigue. EXAM: PORTABLE CHEST 1 VIEW COMPARISON:  03/13/2018. FINDINGS: Stable enlarged cardiac silhouette. Right basilar atelectasis or scarring mild improvement. Otherwise, clear lungs. Thoracic spine degenerative changes. IMPRESSION: 1. Right basilar atelectasis or scarring with mild improvement. 2. Stable cardiomegaly. Electronically Signed   By: Claudie Revering M.D.   On: 10/13/2018 13:07    Pending Labs Unresulted Labs (From admission, onward)    Start     Ordered   10/17/2018 1850  Ammonia  Add-on,   AD     10/27/2018 1849   10/30/2018 1752  Lactic acid, plasma  STAT Now then every 3 hours,   R (with STAT occurrences)     10/10/2018 1751   10/07/2018 1135  CBC WITH DIFFERENTIAL  ONCE - STAT,   STAT     10/30/2018 1135   10/11/2018 1135  Blood Culture (routine x 2)  BLOOD CULTURE X 2,  STAT     10/12/2018 1135   10/09/2018 1135  Urinalysis, Routine w reflex microscopic  ONCE - STAT,   STAT     10/29/2018 1135   10/04/2018 1135  Urine culture  ONCE - STAT,   STAT     10/30/2018 1135   Signed and Held  Urinalysis, Routine w reflex microscopic  ONCE - STAT,   STAT    Comments: Please call MD when collected [phone]    Signed and Held          Vitals/Pain Today's Vitals   10/21/2018 1102 10/10/2018 1108 10/10/2018 1653  BP: (!) 164/100  (!) 147/65  Pulse: (!) 101  (!) 101  Resp: 18  19  Temp: 98.7 F (37.1 C)    TempSrc: Oral    SpO2:  94%  94%  PainSc:  0-No pain     Isolation Precautions No active isolations  Medications Medications  dextrose (GLUTOSE) 40 % oral gel 37.5 g (37.5 g Oral Given 10/02/2018 1145)  dextrose 50 % solution 50 mL (50 mLs Intravenous Given 10/26/2018 1651)  sodium chloride 0.9 % bolus 1,000 mL (0 mLs Intravenous Stopped 10/24/2018 1747)  0.9 %  sodium chloride infusion ( Intravenous New Bag/Given 10/18/2018 1748)    Mobility non-ambulatory     Focused Assessments Cardiac Assessment Handoff:  Cardiac Rhythm: Sinus tachycardia Lab Results  Component Value Date   CKTOTAL 143 10/07/2018   TROPONINI 0.03 ([place]) 03/13/2018   No results found for: DDIMER Does the Patient currently have chest pain? No     R Recommendations: See Admitting Provider Note  Report given to:   Additional Notes:

## 2018-10-15 NOTE — ED Notes (Signed)
Family at bedside. 

## 2018-10-15 NOTE — ED Notes (Signed)
Patient transported to CT 

## 2018-10-15 NOTE — H&P (Addendum)
[facility] Admission History and Physical Service Pager: [phone]  Patient name: Stephanie [name] Medical record number: [id number] Date of birth: [birth year] Age: 58 y.o. Gender: female  Primary Care Provider: Patient, No Pcp Per previously Dr. Lovie Macadamia [facility]) Consultants: Gen surgery  Code Status: DNR/DNI Preferred Emergency Contact: Gwenlyn Fudge  Weight (as of Jan 2020): 196.9kg  Chief Complaint: AMS & fatigue  Assessment and Plan: Stephanie [name] is a 58 y.o. female presenting with AMS and fatigue and AKI. PMH is significant for CKD, respiratory failure, morbid obesity, peripheral vascular disease, HTN, HLD, pressure injuries and anemia.     Increased Fatigue, Decreased PO intake, w/ acute AMS According to family, patient is conversant at baseline and talks to mother and brother over the phone on regular basis; requires 24 hour nursing care for ADLs. AMS is described as having decreased PO intake at her residential facility and acutely becoming disoriented and increasingly lethargic in the last couple weeks. Patient is unable to provide history for this admission. Differential for AMS includes infectious process(meets sepsis criteria), CVA (patient is bed bound due to morbid obesity), hypoglycemia, toxic metabolic encephalopathy (NASH on CT). Patient does not have any CN deficits and no FND making CVA less likely. This could also be behavioral.  Unlikely to be medication as patient is only on gabapentin.  On physical exam, patient is somnolent, but does open eyes to name. Her GCS is 14, but otherwise does not respond to many questions. Unclear if behavioral or due to confusion. Patient has history of NASH consistent with CT imaging, this could be contributing to altered mental status although patient has normal AST and ALT levels. Other potential cause of mental status change is metabolic related as patient has been taking less PO nutrition  according to family. She has been eating and drinking less at [facility].  Patient will be admitted for further work up of AMS and IV treatment of infection. Urine sample difficult to obtain in ED give patient's body habitus. Will continue to follow to ensure collection.  -admit to family practice teaching service, Dr. McDiarmid, med surg -f/u  blood cultures -collect urine for UA and culture -f/u ammonia level -holding gabapentin in setting of AMS -update family daily  -PT/OT  -SLP when patient improved mental status for swallow eval  -NPO  Sepsis  Patient has 3 mm well circumscribed open wound on RUQ abdomen within skin fold leaking serosanguinous fluid. Patient has TTP of abdomen diffusely. Patient also has several areas of dermatitis and skin break down in a majority of intertriginous areas. CT is significant for sinus tract that travels deep in the dermis to abdominal fascia.  Patient meets sepsis criteria with elevated WBC, tachycardia and possible source of infection as noted above. Highest on the differential diagnoses list for infectious process is likely abdominal wound. Patient has lactic acid that is increasing from 2.1 on presentation to now 3. WBC count is mildly elevated at 11.3 with ANC 8.0. Pulmonary cause must be considered as CT findings cannot exclude infiltrate but appearance is consistent with atelectasis. However, patient does not require increased O2 from 2 L baseline and without other respiratory symptoms. -f/u  blood cultures -collect urine for UA and culture -Vancomycin and Cefepime per pharmacy - monitor fever curve & CBC  -trend lactic acid  -Tylenol for fever and pain PRN  -hold sedating pain medications in setting of AMS if possible  HTN Hypertensive on admission systolic pressure ranging from 147-164/65-100.  Home meds: Metoprolol  -Continue metoprolol 25 mg twice daily - Fluids in setting of acidosis and AKI -continue to monitor   Chronic respiratory  failure: At baseline, patient requires 2L O2 [place]. Unclear etiology of chronic respiratory failure. Likely some component of OHS. Appears patient had admission for HAP in January 2019. Prior to that admission, patient not on O2. Chest x-ray negative for pulmonary infiltrates or opacities. CT with possible atalectasis at bases. Patient saturating at 94% on 2L with respiratory rate range 18 to 19 breaths/min.  Pulmonary exam without wheezing, difficult to assess due to body habitus.  -Continue O2, titrate O2 goals > 92% -nightly CPAP  Anemia, normocytic, chronic Hbg 8.6 on admission, was 11 on previous admission in January. On recheck, increased to 10.8 which is not far from baseline. Should consider iron deficiency and other vitamin deficiencies given patient's poor PO intake. No emergent reason to work up. Will recommend for discharge.  -monitor CBC   AKI Baseline appears to be 0.9 based on previous admission in January 2020. Creatinine today is 2.96. Given decrease in PO intake, likely due to dehydration; however, no urine studies have yet been obtained due to difficulty with in and out cath. Difficult to assess for volume overload due to body habitus however patient does have 3+ edema in bilateral feet. Patient was a difficult stick and had delayed fluid bolus. Ultimately, received 1L bolus in the ED.  -125 cc/hr maintenance x 12 hours -bladder scan q shift  -I/O cath if >300   Intertriginous dermatitis Lower extremity skin breakdown in dependent areas Likely pressure ulcers?  Bed bound Significant dermatitis in multiple intertriginous areas. Some areas with active bleeding and open dermis. No oozing or drainage from other sites besides RUQ lesion. Patient with erythematous skin on dependent areas in lower extremities. Unable to see under patient due to habitus and small bed. -consult wound care  -readjust in bed q 2 hours  -pressure ulcer precautions  -apply desitin to intertriginous  areas -if signs of infection, please notify MD for further order. -ulcer care as above   Goals of Care DNR  Confirmed with family that patient is DNR. Gwyndolyn Saxon reports that they have had many conversations about this previously. Patient has DNR form signed from facility.   GERD:  Continue famotidine 20mg  daily   FEN/GI: NPO until improved mental status. 125cc/hr fluids x 12 hours. Fibercon 625mg  & Miralax 7.5g packet   Prophylaxis: Lovenox, weight based would require 98.45mg  at 0.5mg /kg, will appreciate pharmacy recs  Disposition: Admit to family practice teaching service  History of Present Illness:  Stephanie [name] is a 58 y.o. female presenting with fatigue and altered mental status.   Patient presents via EMS from [facility] facility.  Patient reported as having increased lethargy and altered mental status. Stephanie [name] was previously reported as weighing 400 pounds and is now 310 pounds.  EMS states patient is malnourished and not eating normally for the last few weeks.  History is limited as patient has no support staff or family at the bedside.   Patient's brother reports having minimal contact with their sister due to visiting restrictions; however, she continued to keep contact with family over the phone and was brought food by family.  Patient is reported as taking in less PO recently. She transitioned to living at [facility] facility in Dec of 2019. Prior to this move to the facility, she was working and functioning independently. Patient had several infections and recent pivotal  event when she fell on her knees and was unable to get up (weighing 500-600 pounds at that time). Patient has been immobile since then.  ED Course:  Elevated lab values include LA of 3.1, low CBG of 67, elevated CR. 2.7, BUN 28,   Low lab values: Hemoglobin  U/A: pending, difficult to collect   Patient received mIVFs at 113mL/hr NS, dextrose oral gel and dextrose 50% in fluids,  and 1 liter NS  Review Of Systems: Per HPI with the following additions:  Review of Systems  Unable to perform ROS: Mental status change    Patient Active Problem List   Diagnosis Date Noted  . HAP (hospital-acquired pneumonia) 03/09/2018  . AKI (acute kidney injury) ([place]) 03/09/2018  . Chronic respiratory failure with hypoxia ([place]) 03/09/2018  . Hypertension 02/16/2018  . Morbid obesity with BMI of 70 and over, adult ([place]) 02/16/2018  . Pressure injury of skin 01/18/2018  . Sepsis ([place]) 01/01/2018    Past Medical History: Past Medical History:  Diagnosis Date  . Hypertension   . Obesity     Past Surgical History: No past surgical history on file.  Social History: Social History   Tobacco Use  . Smoking status: Never Smoker  . Smokeless tobacco: Never Used  Substance Use Topics  . Alcohol use: Not Currently  . Drug use: Not Currently    Family History: Family History  Problem Relation Age of Onset  . Hypertension Mother    Allergies and Medications: No Known Allergies No current facility-administered medications on file prior to encounter.    Current Outpatient Medications on File Prior to Encounter  Medication Sig Dispense Refill  . acetaminophen (TYLENOL) 500 MG tablet Take 500-1,000 mg by mouth every 6 (six) hours as needed for headache or pain.    . Amino Acids-Protein Hydrolys (FEEDING SUPPLEMENT, PRO-STAT SUGAR FREE 64,) LIQD Take 30 mLs by mouth 2 (two) times daily.    . bisacodyl (DULCOLAX) 10 MG suppository Place 10 mg rectally every 4 (four) hours as needed for moderate constipation.    . feeding supplement, ENSURE ENLIVE, (ENSURE ENLIVE) LIQD Take 237 mLs by mouth 3 (three) times daily between meals. (Patient taking differently: Take 237 mLs by mouth 2 (two) times daily as needed. (Lunch/Dinner) if only ingests 50% of food) 237 mL 12  . furosemide (LASIX) 40 MG tablet Take 1 tablet (40 mg total) by mouth 2 (two) times daily. 30 tablet 0  .  ipratropium-albuterol (DUONEB) 0.5-2.5 (3) MG/3ML SOLN Take 3 mLs by nebulization every 4 (four) hours as needed. 360 mL   . ipratropium-albuterol (DUONEB) 0.5-2.5 (3) MG/3ML SOLN Take 3 mLs by nebulization 3 (three) times daily. 360 mL 0  . lactose free nutrition (BOOST) LIQD Take 237 mLs by mouth 2 (two) times daily.    . magnesium hydroxide (MILK OF MAGNESIA) 400 MG/5ML suspension Take 30 mLs by mouth every 12 (twelve) hours as needed for mild constipation.    . metoprolol tartrate (LOPRESSOR) 25 MG tablet Take 1 tablet (25 mg total) by mouth 2 (two) times daily.    . mineral oil-hydrophilic petrolatum (AQUAPHOR) ointment Apply to feet daily for 90 days. (Patient not taking: Reported on 03/09/2018) 420 g 0  . Multiple Vitamins-Minerals (CERTA-VITE PO) Take 1 tablet by mouth daily. Certa-vite w/ anti-oxidants for wound healing    . NONFORMULARY OR COMPOUNDED ITEM [place] Solution - soak feet for 15-20 minutes weekly for 4 weeks. (Patient not taking: Reported on 03/09/2018) 4 each 0  .  polyethylene glycol (MIRALAX / GLYCOLAX) packet Take 17 g by mouth daily as needed for mild constipation. 14 each 0  . protein supplement (RESOURCE BENEPROTEIN) 6 g POWD Take 1 scoop by mouth daily.      Objective: BP (!) 164/100 (BP Location: Right Wrist)   Pulse (!) 101   Temp 98.7 F (37.1 C) (Oral)   Resp 18   SpO2 94%   Exam: General: Morbidly obese female lying in bed with eyes open in no acute distress Eyes: Extraocular muscles intact, patient blinks frequently, no scleral icterus, pupils are equal in diameter, reactive to light. ENTM: Unable to assess oropharynx is patient has trouble following commands Neck: Deep skin fold with ointment applied Cardiovascular: No murmurs appreciated, diffuse edema in all extremities, S1 and S2 normal no friction rubs or gallops appreciated. 3+ pitting in b/l feet. 2+ pitting more proximally. Respiratory: No crackles, patient appears to be aerating well, no wheezing,  patient appears to have no increased work of breathing Gastrointestinal: Diffuse abdominal tenderness to palpation MSK: Pain with elevation of bilateral lower extremities, extensive pitting edema bilateral feet, hyperpigmented lower extremities, right lower extremity with lesion on anterolateral service on distal surface. Eschar with surrounding hyperkeratosis.  Derm: 87mm open sinus on right upper quadrant abdomen in skin fold that is draining serosanguinous fluid. Hyperkeratosis of bilateral feet. Discoloration of toes, with mastication between toes, but no active necrosis appreciated. Skin with diffuse scale on distal upper and lower extremities, large pannus with dry skin, several intertriginous wounds with varying degrees of dermatitis, odor, mastication.  Neuro: Patient is alert, only oriented to self. Responds to other orientation questions with her name. Patient minimally responsive. Will respond to name and look at speaker; however, will not nod head yes or no. Patient responds most to pain and says, "ow that hurts" looks raises head to look at painful area. Does not squeeze hands on command. CN grossly intact. No focal neurological deficits.    Labs and Imaging: CBC BMET  Recent Labs  Lab 10/16/2018 1505  10/02/2018 1521  WBC 11.3*  --   --   HGB 8.6*   < > 10.9*  HCT 26.6*   < > 32.0*  PLT 272  --   --    < > = values in this interval not displayed.   Recent Labs  Lab 10/22/2018 1505  10/12/2018 1521  NA 140   < > 139  K 4.3   < > 4.3  CL 100  --  99  CO2 28  --   --   BUN 28*  --  35*  CREATININE 2.96*  --  2.70*  GLUCOSE 91  --  81  CALCIUM 8.7*  --   --    < > = values in this interval not displayed.     Venous gas: 7.326/57.1/34.0 Blood cx  Urine culture: pending   [id number] tachycardia, premature ventricular complex, LA enlargement  Abdominal CT:  IMPRESSION: 1. Examination limited by patient body habitus and significant streak artifact from the patient's arms. 2.  Severe hepatic steatosis. 3. Bilateral nonobstructing renal nephroliths. 4. Probable chronic periumbilical fluid collection without definite CT evidence for an abscess. 5. Cardiomegaly. 6. Bibasilar airspace opacities favored to represent atelectasis. An infiltrate is not excluded and should be correlated clinically. 7.  Aortic Atherosclerosis (ICD10-I70.0).  Stark Klein, MD 10/22/2018, 4:30 PM PGY-1, [facility] Intern pager: [phone], text pages welcome  FPTS Upper-Level Resident Addendum  I have independently interviewed  and examined the patient. I have discussed the above with the original author and agree with their documentation. My edits for correction/addition/clarification are in - blue. Please see also any attending notes.    Wilber Oliphant, M.D.  PGY-2  Family Medicine Teaching Service 10/17/2018 9:09 PM  [place] Service pager: [phone] (text pages welcome through [place])

## 2018-10-16 ENCOUNTER — Inpatient Hospital Stay (HOSPITAL_COMMUNITY): Payer: Self-pay

## 2018-10-16 DIAGNOSIS — D72829 Elevated white blood cell count, unspecified: Secondary | ICD-10-CM | POA: Diagnosis present

## 2018-10-16 DIAGNOSIS — E8809 Other disorders of plasma-protein metabolism, not elsewhere classified: Secondary | ICD-10-CM | POA: Diagnosis present

## 2018-10-16 DIAGNOSIS — E7220 Disorder of urea cycle metabolism, unspecified: Secondary | ICD-10-CM | POA: Diagnosis present

## 2018-10-16 DIAGNOSIS — A419 Sepsis, unspecified organism: Principal | ICD-10-CM

## 2018-10-16 DIAGNOSIS — R634 Abnormal weight loss: Secondary | ICD-10-CM | POA: Diagnosis present

## 2018-10-16 DIAGNOSIS — R6521 Severe sepsis with septic shock: Secondary | ICD-10-CM

## 2018-10-16 DIAGNOSIS — E43 Unspecified severe protein-calorie malnutrition: Secondary | ICD-10-CM | POA: Diagnosis present

## 2018-10-16 DIAGNOSIS — R935 Abnormal findings on diagnostic imaging of other abdominal regions, including retroperitoneum: Secondary | ICD-10-CM | POA: Diagnosis present

## 2018-10-16 DIAGNOSIS — L304 Erythema intertrigo: Secondary | ICD-10-CM | POA: Diagnosis present

## 2018-10-16 DIAGNOSIS — R401 Stupor: Secondary | ICD-10-CM | POA: Diagnosis present

## 2018-10-16 LAB — POCT I-STAT 7, (LYTES, BLD GAS, ICA,H+H)
Acid-Base Excess: 2 mmol/L (ref 0.0–2.0)
Bicarbonate: 25.9 mmol/L (ref 20.0–28.0)
Calcium, Ion: 1.12 mmol/L — ABNORMAL LOW (ref 1.15–1.40)
HCT: 28 % — ABNORMAL LOW (ref 36.0–46.0)
Hemoglobin: 9.5 g/dL — ABNORMAL LOW (ref 12.0–15.0)
O2 Saturation: 98 %
Patient temperature: 99.1
Potassium: 4.1 mmol/L (ref 3.5–5.1)
Sodium: 142 mmol/L (ref 135–145)
TCO2: 27 mmol/L (ref 22–32)
pCO2 arterial: 38.8 mmHg (ref 32.0–48.0)
pH, Arterial: 7.434 (ref 7.350–7.450)
pO2, Arterial: 107 mmHg (ref 83.0–108.0)

## 2018-10-16 LAB — GLUCOSE, CAPILLARY
Glucose-Capillary: 101 mg/dL — ABNORMAL HIGH (ref 70–99)
Glucose-Capillary: 106 mg/dL — ABNORMAL HIGH (ref 70–99)
Glucose-Capillary: 108 mg/dL — ABNORMAL HIGH (ref 70–99)
Glucose-Capillary: 96 mg/dL (ref 70–99)

## 2018-10-16 MED ORDER — SODIUM CHLORIDE 0.9 % IV BOLUS
1000.0000 mL | Freq: Once | INTRAVENOUS | Status: AC
  Administered 2018-10-16: 1000 mL via INTRAVENOUS

## 2018-10-16 MED ORDER — PANTOPRAZOLE SODIUM 40 MG IV SOLR
40.0000 mg | INTRAVENOUS | Status: DC
  Administered 2018-10-16 – 2018-10-17 (×2): 40 mg via INTRAVENOUS
  Filled 2018-10-16 (×2): qty 40

## 2018-10-16 MED ORDER — SODIUM CHLORIDE 0.9 % IV SOLN
2.0000 g | INTRAVENOUS | Status: DC
  Administered 2018-10-16: 2 g via INTRAVENOUS
  Filled 2018-10-16 (×2): qty 2

## 2018-10-16 MED ORDER — VANCOMYCIN VARIABLE DOSE PER UNSTABLE RENAL FUNCTION (PHARMACIST DOSING): Status: DC

## 2018-10-16 MED ORDER — SODIUM CHLORIDE 0.9 % IV SOLN
INTRAVENOUS | Status: DC | PRN
  Administered 2018-10-17: 02:00:00 via INTRA_ARTERIAL

## 2018-10-16 MED ORDER — NOREPINEPHRINE 4 MG/250ML-% IV SOLN
0.0000 ug/min | INTRAVENOUS | Status: DC
  Administered 2018-10-16: 20 ug/min via INTRAVENOUS
  Filled 2018-10-16: qty 250

## 2018-10-16 MED ORDER — DEXTROSE-NACL 5-0.9 % IV SOLN
INTRAVENOUS | Status: DC
  Administered 2018-10-16 – 2018-10-17 (×2): via INTRAVENOUS

## 2018-10-16 MED ORDER — LACTULOSE ENEMA
300.0000 mL | Freq: Two times a day (BID) | ORAL | Status: DC
  Filled 2018-10-16: qty 300

## 2018-10-16 MED ORDER — CHLORHEXIDINE GLUCONATE CLOTH 2 % EX PADS
6.0000 | MEDICATED_PAD | Freq: Every day | CUTANEOUS | Status: DC
  Administered 2018-10-16: 18:00:00 6 via TOPICAL

## 2018-10-16 MED ORDER — LACTULOSE 10 GM/15ML PO SOLN
20.0000 g | Freq: Two times a day (BID) | ORAL | Status: DC
  Administered 2018-10-16 (×2): 20 g
  Filled 2018-10-16 (×2): qty 30

## 2018-10-16 NOTE — Progress Notes (Signed)
eLink Physician-Brief Progress Note Patient Name: Stephanie [name] DOB: [birth year] MRN: [id number]   Date of Service  10/16/2018  HPI/Events of Note  Request for foley as she had an in/out catheter earlier and difficult to get /monitor due to body habitus  eICU Interventions  Order placed     Intervention Category Intermediate Interventions: Oliguria - evaluation and management  Margaretmary Lombard 10/16/2018, 9:04 PM

## 2018-10-16 NOTE — Progress Notes (Signed)
PHARMACY - PHYSICIAN COMMUNICATION CRITICAL VALUE ALERT - BLOOD CULTURE IDENTIFICATION (BCID)  Stephanie [name] is an 58 y.o. female who presented to [facility] on 10/13/2018 with a chief complaint of wound infection.   Name of physician (or Provider) Contacted: Sood  Current antibiotics: Cefepime + vancomycin  Changes to prescribed antibiotics recommended:  Patient is on recommended antibiotics - No changes needed  Results: Gram positive rods in 1 of 2 blood cultures   Arrie Senate, PharmD, BCPS Clinical Pharmacist [phone] Please check AMION for all [place] numbers 10/16/2018

## 2018-10-16 NOTE — Progress Notes (Signed)
Spoke with pt's brother Corene Cornea. Brother asked to speak with Md regarding request to transfer pt to [facility]. MD aware.

## 2018-10-16 NOTE — Progress Notes (Signed)
   Vital Signs MEWS/VS Documentation      10/25/2018 2153 10/06/2018 2300 10/16/2018 0020 10/16/2018 0408   MEWS Score:  -  2  3  5    MEWS Score Color:  -  Yellow  Yellow  Red   Resp:  -  -  13  12   BP:  -  -  105/63  (!) 82/62   Temp:  -  -  -  98.1 F (36.7 C)   Level of Consciousness:  Responds to Pain  -  -  -         MEWS Guidelines - (patients age 56 and over)  Red - At High Risk for Deterioration Yellow - At risk for Deterioration  1. Go to room and assess patient 2. Validate data. Is this patient's baseline? If data confirmed: 3. Is this an acute change? 4. Administer prn meds/treatments as ordered. 5. Note Sepsis score 6. Review goals of care 7. Sports coach, RRT nurse and Provider. 8. Ask Provider to come to bedside.  9. Document patient condition/interventions/response. 10. Increase frequency of vital signs and focused assessments to at least q15 minutes x 4, then q30 minutes x2. - If stable, then q1h x3, then q4h x3 and then q8h or dept. routine. - If unstable, contact Provider & RRT nurse. Prepare for possible transfer. 11. Add entry in progress notes using the smart phrase ".MEWS". 1. Go to room and assess patient 2. Validate data. Is this patient's baseline? If data confirmed: 3. Is this an acute change? 4. Administer prn meds/treatments as ordered? 5. Note Sepsis score 6. Review goals of care 7. Sports coach and Provider 8. Call RRT nurse as needed. 9. Document patient condition/interventions/response. 10. Increase frequency of vital signs and focused assessments to at least q2h x2. - If stable, then q4h x2 and then q8h or dept. routine. - If unstable, contact Provider & RRT nurse. Prepare for possible transfer. 11. Add entry in progress notes using the smart phrase ".MEWS".  Green - Likely stable [place] - Comfort Care Only  1. Continue routine/ordered monitoring.  2. Review goals of care. 1. Continue routine/ordered  monitoring. 2. Review goals of care.      Stephanie [name] 10/16/2018,4:49 AM

## 2018-10-16 NOTE — Progress Notes (Signed)
Aware of patient's decreasing BP.  Attempting to page CCM.  I am concerned that patient is third spacing and might require pressers as opposed to more IVF.  -Dr. Criss Rosales

## 2018-10-16 NOTE — Consult Note (Signed)
NAME:  Stephanie [name], MRN:  [id number], DOB:  [birth year], LOS: 1 ADMISSION DATE:  10/24/2018, CONSULTATION DATE:  10/16/2018 REFERRING MD:  Dr. McDiarmid, FPTS, CHIEF COMPLAINT:  Low BP   Brief History   58 yo female from [facility] with altered mental status. Found to have acute renal failure, low BP, hypoxia, elevated ammonia, and pneumonia.  Transferred to ICU 8/16 due to AMS, respiratory failure, and low BP.  Hx from chart and medical staff.  Pt not able to provide hx.  Past Medical History  CKD, PAD, HTN, HLD, Anemia, Chronic respiratory failure on 2 liters O2  Significant Hospital Events   8/15 Admit 8/16 Transfer to ICU  Consults:    Procedures:  Rt PICC 8/15 >>   Significant Diagnostic Tests:  CT abd/pelvis >> basilar consolidation, fatty liver, GB sludge, non obstructing renal stones b/l, diverticulosis, atherosclerosis  Micro Data:  Blood 8/15 >>  COVID 8/15 >> negative  Antimicrobials:  Vancomycin 8/15 >> Cefepime 8/15 >>   Interim history/subjective:    Objective   Blood pressure (!) 94/53, pulse 65, temperature 98.2 F (36.8 C), temperature source Oral, resp. rate 13, height 5\' 4"  (1.626 m), weight (!) 140.5 kg, SpO2 99 %.        Intake/Output Summary (Last 24 hours) at 10/16/2018 [id number] Last data filed at 10/05/2018 1747 Gross per 24 hour  Intake 500 ml  Output -  Net 500 ml   Filed Weights   10/16/2018 2100  Weight: (!) 140.5 kg    Examination:  General - morbidly obese Eyes - pupils reactive ENT - no sinus tenderness, no stridor Cardiac - regular rate/rhythm, no murmur Chest - decreased BS, scattered rhonchi Abdomen - soft, non tender, + bowel sounds Extremities - 2+ edema Skin - pressure wounds in several locations Neuro - intermittent myoclonic jerks, not following commands, opens eyes with brisk stimulation but then quickly falls back to sleep   Resolved Hospital Problem list     Assessment & Plan:   Acute respiratory failure in  setting PNA, sepsis, AKI, morbid obesity and presumed sleep disordered breathing. - transfer to ICU - check ABG - start Bipap - oxygen to keep SpO2 88 to 95% - DNR/DNI  Septic shock from PNA. - pressors to keep MAP > 65 - continue IV fluids - day 2 of ABx - place arterial line if able to more accurately assess blood pressure  AKI from ATN. - baseline creatinine 0.91 from 03/14/18 - continue IV fluids - monitor renal fx, urine outpt  Acute metabolic encephalopathy. - has elevated ammonia, AKI, probable hypercarbia - place NG tube and add lactulose - check TSH - hold outpt neurontin  Hypoglycemia. - add dextrose to IV fluid - f/u blood sugars  Pressure wounds. - present prior to admission - wound care   Best practice:  Diet: NPO DVT prophylaxis: Lovenox GI prophylaxis: Protonix Mobility: Bed rest Code Status: DNR/DNI Disposition: ICU  Labs   CBC: Recent Labs  Lab 10/14/2018 1505 10/05/2018 1520 10/18/2018 1521  WBC 11.3*  --   --   NEUTROABS 8.0*  --   --   HGB 8.6* 11.2* 10.9*  HCT 26.6* 33.0* 32.0*  MCV 92.7  --   --   PLT 272  --   --     Basic Metabolic Panel: Recent Labs  Lab 10/28/2018 1505 10/31/2018 1520 10/30/2018 1521  NA 140 140 139  K 4.3 4.3 4.3  CL 100  --  99  CO2 28  --   --  GLUCOSE 91  --  81  BUN 28*  --  35*  CREATININE 2.96*  --  2.70*  CALCIUM 8.7*  --   --    GFR: Estimated Creatinine Clearance: 32.7 mL/min (A) (by C-G formula based on SCr of 2.7 mg/dL (H)). Recent Labs  Lab 10/18/2018 1251 10/18/2018 1505 10/11/2018 2309  WBC  --  11.3*  --   LATICACIDVEN 2.4* 3.1* 2.3*    Liver Function Tests: Recent Labs  Lab 10/12/2018 1505  AST 51*  ALT 40  ALKPHOS 123  BILITOT 1.5*  PROT 6.7  ALBUMIN 1.8*   No results for input(s): LIPASE, AMYLASE in the last 168 hours. Recent Labs  Lab 10/05/2018 1930  AMMONIA 112*    ABG    Component Value Date/Time   PHART 7.277 (L) 03/10/2018 0915   PCO2ART 76.1 (HH) 03/10/2018 0915    PO2ART 74.8 (L) 03/10/2018 0915   HCO3 29.8 (H) 10/22/2018 1520   TCO2 30 10/08/2018 1521   O2SAT 59.0 10/14/2018 1520     Coagulation Profile: Recent Labs  Lab 10/24/2018 2200  INR 1.2    Cardiac Enzymes: Recent Labs  Lab 10/28/2018 1505  CKTOTAL 143    HbA1C: Hgb A1c MFr Bld  Date/Time Value Ref Range Status  03/10/2018 09:40 AM 6.5 (H) 4.8 - 5.6 % Final    Comment:    (NOTE) Pre diabetes:          5.7%-6.4% Diabetes:              >6.4% Glycemic control for   <7.0% adults with diabetes     CBG: Recent Labs  Lab 10/13/2018 1104 10/11/2018 1408 10/30/2018 1936  GLUCAP 67* 67* 98    Review of Systems:   Unable to obtain.  Past Medical History  She,  has a past medical history of Hypertension and Obesity.   Surgical History   No past surgical history on file.   Social History   reports that she has never smoked. She has never used smokeless tobacco. She reports previous alcohol use. She reports previous drug use.   Family History   Her family history includes Hypertension in her mother.   Allergies No Known Allergies   Home Medications  Prior to Admission medications   Medication Sig Start Date End Date Taking? Authorizing Provider  acetaminophen (TYLENOL) 500 MG tablet Take 500 mg by mouth 2 (two) times daily.    Yes [provider]  famotidine (PEPCID) 20 MG tablet Take 20 mg by mouth daily before breakfast.    Yes [provider]  furosemide (LASIX) 20 MG tablet Take 20 mg by mouth every other day.   Yes [provider]  gabapentin (NEURONTIN) 100 MG capsule Take 200 mg by mouth at bedtime.    Yes [provider]  lactose free nutrition (BOOST) LIQD Take 237 mLs by mouth 3 (three) times daily between meals.    Yes [provider]  metoprolol tartrate (LOPRESSOR) 25 MG tablet Take 1 tablet (25 mg total) by mouth 2 (two) times daily. 01/17/18  Yes Mody, Ulice Bold, MD  Multiple Vitamins-Minerals (CERTA-VITE PO) Take 1  tablet by mouth daily. Certa-vite w/ anti-oxidants for wound healing   Yes [provider]  OXYGEN Inhale 2 L/min into the lungs continuous.   Yes [provider]  polycarbophil (FIBERCON) 625 MG tablet Take 625 mg by mouth at bedtime. WITH 6 OUNCES OF APPLE JUICE   Yes [provider]  polyethylene glycol (MIRALAX /  GLYCOLAX) packet Take 17 g by mouth daily as needed for mild constipation. Patient taking differently: Take 7.5 g by mouth every other day. MIXED INTO 8 OUNCES OF WATER 01/17/18  Yes Mody, Ulice Bold, MD  PRESCRIPTION MEDICATION CPAP- At bedtime   Yes [provider]  sennosides-docusate sodium (SENOKOT-S) 8.6-50 MG tablet Take 2 tablets by mouth 2 (two) times daily.   Yes [provider]     Critical care time: 85 minutes    Chesley Mires, MD [place] 10/16/2018, 10:13 AM

## 2018-10-16 NOTE — Progress Notes (Signed)
Paged by evening RN to evaluate patient.  You score has been calculated is 5.  Blood pressures remain soft with a map of 70.  This is been despite being on fluids overnight at a rate of 125 an hour.  She has not had any urine output per the night team, bladder scan revealed only 75 mL's in the bladder approximately.  She did get 1 L bolus in the emergency department.  Exam is made more difficult by obesity although she appears to be third spacing slightly.  Respirations still remain normal but patient remains altered and largely only responsive to pain.  RN did note there was weeping and fluid leakage from skin wound on the right underarm.  Patient is too altered for oral intake, did have ammonia lab at 112 so we will need plan to address this as she is unable to take oral lactulose or rifaximin.  Given multiple skin wounds and indication of abscess on CT is also likely this is incidental as infectious cause is most obvious.  Blood culture still pending.  Appreciate the level of care given this patient overnight with MedSurg RN but given patient loads on the floor, will transfer to progressive to more accurately reflect her condition.  Have stopped IV fluids at this time due to concerns of third spacing.  GEN surge will be consulted first thing in the morning to evaluate possible surgical intervention for abscesses.  If blood pressure drops in the day team still believes third spacing is significant concern, will consult critical care to evaluate for pressors.  Dr. Criss Rosales

## 2018-10-16 NOTE — Progress Notes (Signed)
Pharmacy Antibiotic Note  Kailyn Boaz is a 58 y.o. female admitted on 10/13/2018 with concern for sepsis from a wound infection.  Pharmacy has been consulted for Vancomycin and Cefepime dosing.   The patient is noted to be in AKI with baseline SCr<1 and on admit SCr was 2.96, CrCl hard to estimate but presumably <30 ml/min. No accurate UOP has been recorded yet. The patient was loaded with Vancomycin and Cefepime - will hold standing Vancomycin doses for now and adjust Cefepime.   Plan: - Hold Vancomycin - no standing doses for now with AKI - Adjust Cefepime to 2g IV every 24 hours - Will monitor SCr trends to consider additional Vancomycin doses and/or when to check a level to monitor clearance - Will continue to follow renal function, culture results, LOT, and antibiotic de-escalation plans   Height: 5\' 4"  (162.6 cm) Weight: (!) 309 lb 11.9 oz (140.5 kg) IBW/kg (Calculated) : 54.7  Temp (24hrs), Avg:98.4 F (36.9 C), Min:98.1 F (36.7 C), Max:99.1 F (37.3 C)  Recent Labs  Lab 10/23/2018 1251 10/03/2018 1505 10/14/2018 1521 10/17/2018 2309  WBC  --  11.3*  --   --   CREATININE  --  2.96* 2.70*  --   LATICACIDVEN 2.4* 3.1*  --  2.3*    Estimated Creatinine Clearance: 32.7 mL/min (A) (by C-G formula based on SCr of 2.7 mg/dL (H)).    No Known Allergies  Antimicrobials this admission: Vanc 8/16 >> Cefepime 8/16 >>  Microbiology results: 8/15 COVID >> neg 8/15 BCx >> ngtd 8/15 UCx >>  Thank you for allowing pharmacy to be a part of this patient's care.  Alycia Rossetti, PharmD, BCPS Clinical Pharmacist Clinical phone for 10/16/2018: [phone] 10/16/2018 12:04 PM   **Pharmacist phone directory can now be found on [email or web address] ([email or web address]).  Listed under [place].

## 2018-10-16 NOTE — Progress Notes (Signed)
eLink Physician-Brief Progress Note Patient Name: Stephanie [name] DOB: 29-Jan-1961 MRN: [id number]   Date of Service  10/16/2018  HPI/Events of Note  Flexiseal requested  eICU Interventions  Order placed     Intervention Category Minor Interventions: Routine modifications to care plan (e.g. PRN medications for pain, fever)  Margaretmary Lombard 10/16/2018, 9:25 PM

## 2018-10-16 NOTE — Progress Notes (Signed)
Pt attempted x's 2 with no success. Dr Halford Chessman notified by RN.

## 2018-10-16 NOTE — Progress Notes (Signed)
FPTS Interim Progress Note  S:Stephanie [name] is a 58 year old female presenting with AMS, fatigue, and AKI.  PMH significant for CKD, respiratory failure, morbid obesity, peripheral vascular disease, hypertension, hyperlipidemia, pressure injuries and anemia.  Received several calls morning from nursing staff regarding patient's blood pressure.  It has been decreasing this morning aging from 75/51-94/53.  I went to evaluate patient.  Nursing staff was turning and cleaning patient when I entered the room.  She is unresponsive in bed.  Unable to follow commands but would respond to sound head movements.  Nursing staff reports that she is still not had any urine output.  They report moisture in all of her skin folds which they are trying to dry.  O: BP (!) 94/53   Pulse 65   Temp 98.2 F (36.8 C) (Axillary)   Resp 13   Ht 5\' 4"  (1.626 m)   Wt (!) 140.5 kg   SpO2 99%   BMI 53.17 kg/m   General: Patient will respond to noises but cannot follow commands.  Would not verbally respond to questions appears extremely edematous. HEENT: Atraumatic. Normocephalic. Normal TMs and ear canals bilaterally, Normal oropharynx without erythema, lesions, exudate.   Neck: No cervical lymphadenopathy.  Cardiac: RRR, no m/r/g.  Patient has +3 pitting edema and lower extremities bilaterally.  +2 pitting edema more proximally including upper extremities and hands. Respiratory: CTAB, normal work of breathing Abdomen: soft, nontender, nondistended, bowel sounds normal Skin: Hyperpigmented lower extremities with pitting edema and bilateral lower extremities.  Right lower extremity has a lesion on the anterior lateral surface.  Eschar with surrounding hyperkeratosis.  Weeping serosanguineous fluid coming from multiple skin folds. Neuro: Patient is not alert or oriented.  Patient will not verbally respond to questions.  When asked to nod her head patient does not follow commands.  A/P: Zeena Cefalo is a 58 year old  female presenting with AMS, fatigue, and AKI.  PMH significant for CKD, respiratory failure, morbid obesity, peripheral vascular disease, hypertension, hyperlipidemia, pressure injuries and anemia.  Sepsis Patient has 3 mm well-circumscribed open wound in right upper quadrant of abdomen within the skin fold leaking serosanguineous fluid.  CT on admission was significant for a sinus tract that traveled deep in the dermis to the abdominal fascia.  Patient had elevated WBC of 11.3 with an  lactic acid of 2.  4 which was trended to 3.1 and is now down to 2.3.  Patient received fluid bolus but has very little urine output.  Concern overnight for third spacing.  This morning the patient has been recently hypotensive decreasing MAP.  ICU was consulted and are now admitting them to their service. -Patient is being transferred to the ICU  Gifford Shave, MD 10/16/2018, 10:49 AM PGY-1, [facility] Service pager [phone]

## 2018-10-16 NOTE — Progress Notes (Signed)
The patient was bathed, linens changed, and [facility] Bariatric bed with air mattress placed.  Due to the patient's body habitus, she has many body folds.  ALL body folds are significantly impacted by the heavy amounts of moisture accumulating in them, and the moisture smells very sour.  The axilla and abdomen had flat Dermatherapy sheets placed in the folds to wick away moisture.  The MD entered to assess the patient at the end of the bathing process, and agrees the patient is third spacing and tissues are weeping, making the MASD Intertriginous Dermatitis worse. Val Riles, RN, MSN, CWOCN, CNS-BC, pager [phone]

## 2018-10-16 NOTE — Progress Notes (Signed)
Transferred pt via bed to 23M in stable condition. Pt's brother Corene Cornea at bedside.

## 2018-10-16 NOTE — Progress Notes (Signed)
Pt's BP 75/51, MAP of 60. Pt still disoriented X4. MD aware. Will continue to monitor.

## 2018-10-16 NOTE — Progress Notes (Signed)
Report given to [employer] on [facility].

## 2018-10-17 ENCOUNTER — Inpatient Hospital Stay (HOSPITAL_COMMUNITY): Payer: Self-pay

## 2018-10-17 DIAGNOSIS — K7290 Hepatic failure, unspecified without coma: Secondary | ICD-10-CM

## 2018-10-17 DIAGNOSIS — J9601 Acute respiratory failure with hypoxia: Secondary | ICD-10-CM

## 2018-10-17 DIAGNOSIS — R0902 Hypoxemia: Secondary | ICD-10-CM

## 2018-10-17 DIAGNOSIS — Z7189 Other specified counseling: Secondary | ICD-10-CM

## 2018-10-17 DIAGNOSIS — Z515 Encounter for palliative care: Secondary | ICD-10-CM

## 2018-10-17 LAB — CBC
HCT: 24.4 % — ABNORMAL LOW (ref 36.0–46.0)
Hemoglobin: 7.8 g/dL — ABNORMAL LOW (ref 12.0–15.0)
MCH: 29.2 pg (ref 26.0–34.0)
MCHC: 32 g/dL (ref 30.0–36.0)
MCV: 91.4 fL (ref 80.0–100.0)
Platelets: 317 10*3/uL (ref 150–400)
RBC: 2.67 MIL/uL — ABNORMAL LOW (ref 3.87–5.11)
RDW: 21.6 % — ABNORMAL HIGH (ref 11.5–15.5)
WBC: 21.2 10*3/uL — ABNORMAL HIGH (ref 4.0–10.5)
nRBC: 0.2 % (ref 0.0–0.2)

## 2018-10-17 LAB — GLUCOSE, CAPILLARY
Glucose-Capillary: 131 mg/dL — ABNORMAL HIGH (ref 70–99)
Glucose-Capillary: 145 mg/dL — ABNORMAL HIGH (ref 70–99)
Glucose-Capillary: 144 mg/dL — ABNORMAL HIGH (ref 70–99)
Glucose-Capillary: 139 mg/dL — ABNORMAL HIGH (ref 70–99)

## 2018-10-17 LAB — CBC WITH DIFFERENTIAL/PLATELET
Abs Immature Granulocytes: 0.21 K/uL — ABNORMAL HIGH (ref 0.00–0.07)
Basophils Absolute: 0 K/uL (ref 0.0–0.1)
Basophils Relative: 0 %
Eosinophils Absolute: 0 K/uL (ref 0.0–0.5)
Eosinophils Relative: 0 %
HCT: 25.2 % — ABNORMAL LOW (ref 36.0–46.0)
Hemoglobin: 8.2 g/dL — ABNORMAL LOW (ref 12.0–15.0)
Immature Granulocytes: 1 %
Lymphocytes Relative: 10 %
Lymphs Abs: 2.1 K/uL (ref 0.7–4.0)
MCH: 29.8 pg (ref 26.0–34.0)
MCHC: 32.5 g/dL (ref 30.0–36.0)
MCV: 91.6 fL (ref 80.0–100.0)
Monocytes Absolute: 2.3 K/uL — ABNORMAL HIGH (ref 0.1–1.0)
Monocytes Relative: 11 %
Neutro Abs: 16 K/uL — ABNORMAL HIGH (ref 1.7–7.7)
Neutrophils Relative %: 78 %
Platelets: 321 K/uL (ref 150–400)
RBC: 2.75 MIL/uL — ABNORMAL LOW (ref 3.87–5.11)
RDW: 21.7 % — ABNORMAL HIGH (ref 11.5–15.5)
WBC: 20.7 K/uL — ABNORMAL HIGH (ref 4.0–10.5)
nRBC: 0.1 % (ref 0.0–0.2)

## 2018-10-17 LAB — AMMONIA: Ammonia: 118 umol/L — ABNORMAL HIGH (ref 9–35)

## 2018-10-17 LAB — POCT I-STAT 7, (LYTES, BLD GAS, ICA,H+H)
Bicarbonate: 24.3 mmol/L (ref 20.0–28.0)
Calcium, Ion: 1.11 mmol/L — ABNORMAL LOW (ref 1.15–1.40)
HCT: 29 % — ABNORMAL LOW (ref 36.0–46.0)
Hemoglobin: 9.9 g/dL — ABNORMAL LOW (ref 12.0–15.0)
O2 Saturation: 98 %
Patient temperature: 36.3
Potassium: 3.7 mmol/L (ref 3.5–5.1)
Sodium: 143 mmol/L (ref 135–145)
TCO2: 26 mmol/L (ref 22–32)
pCO2 arterial: 38.4 mmHg (ref 32.0–48.0)
pH, Arterial: 7.407 (ref 7.350–7.450)
pO2, Arterial: 108 mmHg (ref 83.0–108.0)

## 2018-10-17 LAB — COMPREHENSIVE METABOLIC PANEL
ALT: 52 U/L — ABNORMAL HIGH (ref 0–44)
AST: 85 U/L — ABNORMAL HIGH (ref 15–41)
Albumin: 1.6 g/dL — ABNORMAL LOW (ref 3.5–5.0)
Alkaline Phosphatase: 116 U/L (ref 38–126)
Anion gap: 14 (ref 5–15)
BUN: 29 mg/dL — ABNORMAL HIGH (ref 6–20)
CO2: 22 mmol/L (ref 22–32)
Calcium: 7.8 mg/dL — ABNORMAL LOW (ref 8.9–10.3)
Chloride: 106 mmol/L (ref 98–111)
Creatinine, Ser: 3.15 mg/dL — ABNORMAL HIGH (ref 0.44–1.00)
GFR calc Af Amer: 18 mL/min — ABNORMAL LOW (ref >60)
GFR calc non Af Amer: 16 mL/min — ABNORMAL LOW (ref >60)
Glucose, Bld: 138 mg/dL — ABNORMAL HIGH (ref 70–99)
Potassium: 3.6 mmol/L (ref 3.5–5.1)
Sodium: 142 mmol/L (ref 135–145)
Total Bilirubin: 1.5 mg/dL — ABNORMAL HIGH (ref 0.3–1.2)
Total Protein: 6.3 g/dL — ABNORMAL LOW (ref 6.5–8.1)

## 2018-10-17 LAB — LACTIC ACID, PLASMA
Lactic Acid, Venous: 3.5 mmol/L (ref 0.5–1.9)
Lactic Acid, Venous: 3.2 mmol/L (ref 0.5–1.9)

## 2018-10-17 LAB — MRSA PCR SCREENING: MRSA by PCR: POSITIVE — AB

## 2018-10-17 LAB — TSH: TSH: 2.081 u[IU]/mL (ref 0.350–4.500)

## 2018-10-17 LAB — PREALBUMIN: Prealbumin: 9.7 mg/dL — ABNORMAL LOW (ref 18–38)

## 2018-10-17 MED ORDER — INSULIN ASPART 100 UNIT/ML ~~LOC~~ SOLN
1.0000 [IU] | SUBCUTANEOUS | Status: DC
  Administered 2018-10-17: 1 [IU] via SUBCUTANEOUS

## 2018-10-17 MED ORDER — SODIUM CHLORIDE 0.9% FLUSH: 10.0000 mL | INTRAVENOUS | Status: DC | PRN

## 2018-10-17 MED ORDER — DEXTROSE 5 % IV SOLN: INTRAVENOUS | Status: DC

## 2018-10-17 MED ORDER — CHLORHEXIDINE GLUCONATE CLOTH 2 % EX PADS
6.0000 | MEDICATED_PAD | Freq: Every day | CUTANEOUS | Status: DC
  Administered 2018-10-17: 6 via TOPICAL

## 2018-10-17 MED ORDER — MUPIROCIN 2 % EX OINT
1.0000 "application " | TOPICAL_OINTMENT | Freq: Two times a day (BID) | CUTANEOUS | Status: DC
  Administered 2018-10-17 (×2): 1 via NASAL
  Filled 2018-10-17: qty 22

## 2018-10-17 MED ORDER — NOREPINEPHRINE 4 MG/250ML-% IV SOLN: 0.0000 ug/min | INTRAVENOUS | Status: DC

## 2018-10-17 MED ORDER — MORPHINE BOLUS VIA INFUSION: 5.0000 mg | INTRAVENOUS | Status: DC | PRN

## 2018-10-17 MED ORDER — NOREPINEPHRINE 16 MG/250ML-% IV SOLN
0.0000 ug/min | INTRAVENOUS | Status: DC
  Administered 2018-10-17: 20 ug/min via INTRAVENOUS
  Filled 2018-10-17: qty 250

## 2018-10-17 MED ORDER — MORPHINE 100MG IN NS 100ML (1MG/ML) PREMIX INFUSION: 10.0000 mg/h | INTRAVENOUS | Status: DC

## 2018-10-17 MED ORDER — LACTULOSE 10 GM/15ML PO SOLN
30.0000 g | Freq: Three times a day (TID) | ORAL | Status: DC
  Administered 2018-10-17: 30 g via ORAL
  Filled 2018-10-17: qty 45

## 2018-10-17 MED ORDER — VITAL AF 1.2 CAL PO LIQD
1000.0000 mL | ORAL | Status: DC
  Administered 2018-10-17: 14:00:00 1000 mL

## 2018-10-17 MED ORDER — GLYCOPYRROLATE 0.2 MG/ML IJ SOLN
0.2000 mg | INTRAMUSCULAR | Status: DC | PRN
  Administered 2018-10-18 – 2018-10-19 (×2): 0.2 mg via INTRAVENOUS
  Filled 2018-10-17: qty 1

## 2018-10-17 MED ORDER — MORPHINE BOLUS VIA INFUSION
2.0000 mg | INTRAVENOUS | Status: DC | PRN
  Filled 2018-10-17: qty 5

## 2018-10-17 MED ORDER — DIPHENHYDRAMINE HCL 50 MG/ML IJ SOLN: 25.0000 mg | INTRAMUSCULAR | Status: DC | PRN

## 2018-10-17 MED ORDER — GLYCOPYRROLATE 1 MG PO TABS
1.0000 mg | ORAL_TABLET | ORAL | Status: DC | PRN
  Filled 2018-10-17: qty 1

## 2018-10-17 MED ORDER — GLYCOPYRROLATE 0.2 MG/ML IJ SOLN
0.2000 mg | INTRAMUSCULAR | Status: DC | PRN
  Filled 2018-10-17: qty 1

## 2018-10-17 MED ORDER — ACETAMINOPHEN 650 MG RE SUPP: 650.0000 mg | Freq: Four times a day (QID) | RECTAL | Status: DC | PRN

## 2018-10-17 MED ORDER — PRO-STAT SUGAR FREE PO LIQD: 30.0000 mL | Freq: Two times a day (BID) | ORAL | Status: DC

## 2018-10-17 MED ORDER — MORPHINE 100MG IN NS 100ML (1MG/ML) PREMIX INFUSION
5.0000 mg/h | INTRAVENOUS | Status: DC
  Administered 2018-10-17: 18:00:00 5 mg/h via INTRAVENOUS
  Administered 2018-10-18 – 2018-10-19 (×3): 10 mg/h via INTRAVENOUS
  Filled 2018-10-17 (×5): qty 100

## 2018-10-17 MED ORDER — CHLORHEXIDINE GLUCONATE CLOTH 2 % EX PADS
6.0000 | MEDICATED_PAD | Freq: Every day | CUTANEOUS | Status: DC
  Administered 2018-10-17: 10:00:00 6 via TOPICAL

## 2018-10-17 MED ORDER — SODIUM CHLORIDE 0.9% FLUSH
10.0000 mL | Freq: Two times a day (BID) | INTRAVENOUS | Status: DC
  Administered 2018-10-17 (×2): 10 mL

## 2018-10-17 MED ORDER — LACTATED RINGERS IV BOLUS
1000.0000 mL | Freq: Once | INTRAVENOUS | Status: AC
  Administered 2018-10-17: 1000 mL via INTRAVENOUS

## 2018-10-17 MED ORDER — ACETAMINOPHEN 325 MG PO TABS: 650.0000 mg | ORAL_TABLET | Freq: Four times a day (QID) | ORAL | Status: DC | PRN

## 2018-10-17 MED ORDER — POLYVINYL ALCOHOL 1.4 % OP SOLN
1.0000 [drp] | Freq: Four times a day (QID) | OPHTHALMIC | Status: DC | PRN
  Filled 2018-10-17: qty 15

## 2018-10-17 NOTE — Progress Notes (Signed)
Central Venous Catheter Insertion Procedure Note  Procedure: Insertion of Central Venous Catheter  Indications:  vascular access  Procedure Details  Informed consent was obtained for the procedure, including sedation.  Risks of lung perforation, hemorrhage, arrhythmia, and adverse drug reaction were discussed.   Maximum sterile technique was used including antiseptics, cap, gloves, gown, hand hygiene, mask and sheet.  Under sterile conditions the skin above the on the right internal jugular vein was prepped with betadine and covered with a sterile drape. Local anesthesia was applied to the skin and subcutaneous tissues. A 22-gauge needle was used to identify the vein. An 18-gauge needle was then inserted into the vein. A guide wire was then passed easily through the catheter. There were no arrhythmias. The catheter was then withdrawn. A 7.0 French triple-lumen was then inserted into the vessel over the guide wire. The catheter was sutured into place.  Findings: There were no changes to vital signs. Catheter was flushed with 10 cc NS. Patient did tolerate procedure well.  Recommendations: CXR ordered to verify placement. 

## 2018-10-17 NOTE — Progress Notes (Signed)
Initial Nutrition Assessment  DOCUMENTATION CODES:   Morbid obesity  INTERVENTION:    Vital AF 1.2 at 75 ml/h (1800 ml per day) via NG tube.   Provides 2160 kcal, 135 gm protein, 1460 ml free water daily.  NUTRITION DIAGNOSIS:   Inadequate oral intake related to lethargy/confusion as evidenced by NPO status.  GOAL:   Patient will meet greater than or equal to 90% of their needs  MONITOR:   TF tolerance, Diet advancement, PO intake, Skin, Labs  REASON FOR ASSESSMENT:   Ventilator, Consult Enteral/tube feeding initiation and management  ASSESSMENT:   58 yo female admitted with AMS. Found to have acute renal failure, hypotension, PNA. Transferred to ICU on 8/16 with respiratory distress. PMH includes CKD, PAD, HTN, HLD, anemia, chronic respiratory failure on home oxygen.   Discussed patient in ICU rounds and with RN today. Patient is NPO d/t lethargy. Currently on 6L nasal cannula. Oxygen requirement increasing.   Received MD Consult for TF initiation and management. NG tube in place, currently clamped.    Labs reviewed. BUN 29 (H), creatinine 3.15 (H), prealbumin 9.7 (L) CBG's: [id number]  Medications reviewed and include novolog, lactulose, levophed.  25-39% weight loss within the past year, unsure if weight loss is related to fluid status.  Patient unable to provide any nutrition history at this time.   NUTRITION - FOCUSED PHYSICAL EXAM:    Most Recent Value  Orbital Region  No depletion  Upper Arm Region  No depletion  Thoracic and Lumbar Region  No depletion  Buccal Region  No depletion  Temple Region  No depletion  Clavicle Bone Region  No depletion  Clavicle and Acromion Bone Region  No depletion  Scapular Bone Region  No depletion  Dorsal Hand  No depletion  Patellar Region  No depletion  Anterior Thigh Region  No depletion  Posterior Calf Region  No depletion  Edema (RD Assessment)  Severe  Hair  Reviewed  Eyes  Reviewed  Mouth  Reviewed   Skin  Reviewed  Nails  Reviewed       Diet Order:   Diet Order            Diet NPO time specified  Diet effective now              EDUCATION NEEDS:   Not appropriate for education at this time  Skin:  Skin Assessment: Skin Integrity Issues: Skin Integrity Issues:: Other (Comment) Other: MASD; multiple areas of non pressure related skin breakdown  Last BM:  8/17 (type 7, rectal tube)  Height:   Ht Readings from Last 1 Encounters:  10/10/2018 5\' 4"  (1.626 m)    Weight:   Wt Readings from Last 1 Encounters:  10/08/2018 (!) 140.5 kg    Ideal Body Weight:  54.5 kg  BMI:  Body mass index is 53.17 kg/m.  Estimated Nutritional Needs:   Kcal:  1950-2150  Protein:  110-135 gm  Fluid:  2 L    Molli Barrows, RD, LDN, [place] Pager [phone] After Hours Pager [phone]

## 2018-10-17 NOTE — Progress Notes (Signed)
Entire family arrived for discussion.  They are very upset because they feel that the skin breakdown should not have been allowed to progress to this extent but they understand the disease process.  Mother, 2 brother and 8 nieces were spoken to.  After discussion, full DNR with progress with withdrawal of levophed after starting morphine when family is ready.  RN aware.    The patient is critically ill with multiple organ systems failure and requires high complexity decision making for assessment and support, frequent evaluation and titration of therapies, application of advanced monitoring technologies and extensive interpretation of multiple databases.   Critical Care Time devoted to patient care services described in this note is  35  Minutes. This time reflects time of care of this signee Dr Jennet Maduro. This critical care time does not reflect procedure time, or teaching time or supervisory time of PA/NP/Med student/Med Resident etc but could involve care discussion time.  Rush Farmer, M.D. [facility] Pulmonary/Critical Care Medicine. Pager: [phone]. After hours pager: [phone].

## 2018-10-17 NOTE — Progress Notes (Signed)
eLink Physician-Brief Progress Note Patient Name: Stephanie [name] DOB: 01/26/1961 MRN: [id number]   Date of Service  10/17/2018  HPI/Events of Note  1. Request for ABG 2. No IV access, patient pulled out midline cath, was on 25 mic of levophed  eICU Interventions  1. ABG ordered 2. IV team unable to place any lines x 3. Spoke with Dr Mariane Masters, central line will be placed     Intervention Category Major Interventions: Other:  Margaretmary Lombard 10/17/2018, 12:32 AM

## 2018-10-17 NOTE — Progress Notes (Addendum)
Multiple contacts with [facility].  After long discussions and discussion with the intensivit in [place], patient was rejected in transfer since they will not do anything different there than what we are doing here.  I do not believe patient will survive this admission.  Called brother who evidently left his phone at home and was not available and he is to call us back.  I anticipate patient will die today.    Brother called back, he was informed that patient was not accepted in transfer and that she is suffering from multiple organ failure and that we do not believe she will survive.  He was informed that we will not escalate further care but will take nothing away until they get here for a conversation.  The patient is critically ill with multiple organ systems failure and requires high complexity decision making for assessment and support, frequent evaluation and titration of therapies, application of advanced monitoring technologies and extensive interpretation of multiple databases.   Critical Care Time devoted to patient care services described in this note is  45  Minutes. This time reflects time of care of this signee Dr Jennet Maduro. This critical care time does not reflect procedure time, or teaching time or supervisory time of PA/NP/Med student/Med Resident etc but could involve care discussion time.  Rush Farmer, M.D. [facility] Pulmonary/Critical Care Medicine. Pager: [phone]. After hours pager: [phone].

## 2018-10-17 NOTE — Progress Notes (Signed)
Verbal order from MD for ABG.

## 2018-10-17 NOTE — Consult Note (Signed)
Encountered 2 younger female family members in waiting room when visiting with another pt's wife. In speaking with them learned of this pt's situation. Spoke with nurse and she will page night chaplain coming on mpw, as she said all the family is here and they are withdrawing care.  Rev. Eloise Levels Chaplain

## 2018-10-17 NOTE — Consult Note (Addendum)
NAME:  Stephanie [name], MRN:  [id number], DOB:  October 26, 1960, LOS: 2 ADMISSION DATE:  10/03/2018, CONSULTATION DATE:  10/16/2018 REFERRING MD:  Dr. McDiarmid, FPTS, CHIEF COMPLAINT:  Low BP   Brief History   58 yo female from [place] [facility] with altered mental status. Found to have acute renal failure, low BP, hypoxia, elevated ammonia, and pneumonia.  Transferred to ICU 8/16 due to AMS, respiratory failure, and low BP.  Hx from chart and medical staff.  Pt not able to provide hx.  Past Medical History  CKD, PAD, HTN, HLD, Anemia, Chronic respiratory failure on 2 liters O2  Significant Hospital Events   8/15 Admit 8/16 Transfer to ICU  Consults:    Procedures:  Rt PICC 8/15 >> 8/17 R IJ TLC 8/17>>> NGT 8/17>>>  Significant Diagnostic Tests:  CT abd/pelvis >> basilar consolidation, fatty liver, GB sludge, non obstructing renal stones b/l, diverticulosis, atherosclerosis  Micro Data:  Blood 8/15 >> NTD COVID 8/15 >> negative MRSA screening positive 8/16  Antimicrobials:  Vancomycin 8/15 >> Cefepime 8/15 >>   Interim history/subjective:  Afebrile overnight, pulled her PICC overnight and central line R IJ placed  Objective   Blood pressure 113/83, pulse (!) 118, temperature 98.7 F (37.1 C), temperature source Axillary, resp. rate 20, height 5\' 4"  (1.626 m), weight (!) 140.5 kg, SpO2 94 %.        Intake/Output Summary (Last 24 hours) at 10/17/2018 0846 Last data filed at 10/17/2018 0600 Gross per 24 hour  Intake 2405.8 ml  Output 85 ml  Net 2320.8 ml   Filed Weights   10/05/2018 2100  Weight: (!) 140.5 kg   Examination:  General - morbidly obese, chronically ill appearing female, NAD HEENT - [place]/AT, PERRL, EOM-I and MMM, NGT in place Cardiac - RRR, Nl S1/S2 and -M/R/G Chest - Bibasilar crackles Abdomen - Soft, NT, ND and +BS Extremities - 2+ edema Skin - Pressure wounds in several locations Neuro - Somnolent with intermittent following off commands  I reviewed  CXR myself, NGT in place, RIJ in place with RLL haziness noted  Resolved Hospital Problem list     Assessment & Plan:   Acute respiratory failure in setting PNA, sepsis, AKI, morbid obesity and presumed sleep disordered breathing. - Maintain in the ICU given hemodynamics - No need for further ABG - Bipap with caution given aspiration risk - Titrate O2 for sat of 88-92% - DNR/DNI, pressors only  Septic shock from PNA. - Pressors to keep MAP > 65 - Continue IV fluids while NPO - Day 3 of ABx - Place a-line today, doubt BP measurement is accurate here  AKI from ATN. - Baseline creatinine 0.91 from 03/14/18 - Continue IV fluids - Monitor renal fx, urine outpt - BMET in AM - Replace K today - Check Mg and Phos - Will need to discuss if dialysis is appropriate here, doubtful  Acute metabolic encephalopathy. - Has elevated ammonia, increase lactulose to 30 TID - TF per nutrition - D/C tylenol given severe hepatic steatosis - Hold outpt neurontin  Hypoglycemia. - Add dextrose to IV fluid - F/u blood sugars - TF to be started today  Pressure wounds. - Present prior to admission - Wound care  Will need to discuss dialysis option, if patient continues to remain aneuric then will call renal in AM  Best practice:  Diet: NPO DVT prophylaxis: Lovenox GI prophylaxis: Protonix Mobility: Bed rest Code Status: DNR/DNI Disposition: ICU  Labs   CBC: Recent Labs  Lab 10/09/2018  1505  10/25/2018 1521 10/16/18 1205 10/17/18 0017 10/17/18 0200 10/17/18 0808  WBC 11.3*  --   --   --   --  20.7* 21.2*  NEUTROABS 8.0*  --   --   --   --  16.0*  --   HGB 8.6*   < > 10.9* 9.5* 9.9* 8.2* 7.8*  HCT 26.6*   < > 32.0* 28.0* 29.0* 25.2* 24.4*  MCV 92.7  --   --   --   --  91.6 91.4  PLT 272  --   --   --   --  321 317   < > = values in this interval not displayed.    Basic Metabolic Panel: Recent Labs  Lab 10/07/2018 1505 10/28/2018 1520 10/01/2018 1521 10/16/18 1205 10/17/18 0017  10/17/18 0200  NA 140 140 139 142 143 142  K 4.3 4.3 4.3 4.1 3.7 3.6  CL 100  --  99  --   --  106  CO2 28  --   --   --   --  22  GLUCOSE 91  --  81  --   --  138*  BUN 28*  --  35*  --   --  29*  CREATININE 2.96*  --  2.70*  --   --  3.15*  CALCIUM 8.7*  --   --   --   --  7.8*   GFR: Estimated Creatinine Clearance: 28 mL/min (A) (by C-G formula based on SCr of 3.15 mg/dL (H)). Recent Labs  Lab 10/30/2018 1251 10/08/2018 1505 10/31/2018 2309 10/17/18 0200 10/17/18 0808  WBC  --  11.3*  --  20.7* 21.2*  LATICACIDVEN 2.4* 3.1* 2.3* 3.5*  --     Liver Function Tests: Recent Labs  Lab 10/18/2018 1505 10/17/18 0200  AST 51* 85*  ALT 40 52*  ALKPHOS 123 116  BILITOT 1.5* 1.5*  PROT 6.7 6.3*  ALBUMIN 1.8* 1.6*   No results for input(s): LIPASE, AMYLASE in the last 168 hours. Recent Labs  Lab 10/04/2018 1930 10/17/18 0200  AMMONIA 112* 118*    ABG    Component Value Date/Time   PHART 7.407 10/17/2018 0017   PCO2ART 38.4 10/17/2018 0017   PO2ART 108.0 10/17/2018 0017   HCO3 24.3 10/17/2018 0017   TCO2 26 10/17/2018 0017   O2SAT 98.0 10/17/2018 0017     Coagulation Profile: Recent Labs  Lab 10/20/2018 2200  INR 1.2    Cardiac Enzymes: Recent Labs  Lab 10/17/2018 1505  CKTOTAL 143    HbA1C: Hgb A1c MFr Bld  Date/Time Value Ref Range Status  03/10/2018 09:40 AM 6.5 (H) 4.8 - 5.6 % Final    Comment:    (NOTE) Pre diabetes:          5.7%-6.4% Diabetes:              >6.4% Glycemic control for   <7.0% adults with diabetes     CBG: Recent Labs  Lab 10/16/18 1553 10/16/18 2016 10/16/18 2331 10/17/18 0330 10/17/18 0739  GLUCAP 101* 106* 108* 131* 145*   The patient is critically ill with multiple organ systems failure and requires high complexity decision making for assessment and support, frequent evaluation and titration of therapies, application of advanced monitoring technologies and extensive interpretation of multiple databases.   Critical Care  Time devoted to patient care services described in this note is  45  Minutes. This time reflects time of care of this signee Dr Michaelene Song  Nelda Marseille. This critical care time does not reflect procedure time, or teaching time or supervisory time of PA/NP/Med student/Med Resident etc but could involve care discussion time.  Rush Farmer, M.D. [facility] Pulmonary/Critical Care Medicine. Pager: [phone]. After hours pager: [phone].

## 2018-10-17 NOTE — Progress Notes (Signed)
[facility] Progress Note Patient Name: Stephanie [name] DOB: 1960/12/24 MRN: [id number]   Date of Service  10/17/2018  HPI/Events of Note  Lactate 3.5 Dr Mariane Masters has placed a central line  eICU Interventions  1. LR bolus x 1 2. Repeat Lactate at 7 am 3. Maintain map > 65     Intervention Category Major Interventions: Shock - evaluation and management  [name] 10/17/2018, 4:12 AM

## 2018-10-18 LAB — URINE CULTURE: Culture: NO GROWTH

## 2018-10-18 LAB — CULTURE, BLOOD (ROUTINE X 2)

## 2018-10-18 NOTE — Progress Notes (Signed)
NAME:  Stephanie [name], MRN:  [id number], DOB:  [birth year], LOS: 3 ADMISSION DATE:  10/18/2018, CONSULTATION DATE:  10/16/2018 REFERRING MD:  Dr. McDiarmid, FPTS, CHIEF COMPLAINT:  Low BP   Brief History   58 yo female from [place] [facility] with altered mental status. Found to have acute renal failure, low BP, hypoxia, elevated ammonia, and pneumonia.  Transferred to ICU 8/16 due to AMS, respiratory failure, and low BP.  Hx from chart and medical staff.  Pt not able to provide hx.  Past Medical History  CKD, PAD, HTN, HLD, Anemia, Chronic respiratory failure on 2 liters O2  Significant Hospital Events   8/15 Admit 8/16 Transfer to ICU  Consults:    Procedures:  Rt PICC 8/15 >> 8/17 R IJ TLC 8/17>>> NGT 8/17>>>  Significant Diagnostic Tests:  CT abd/pelvis >> basilar consolidation, fatty liver, GB sludge, non obstructing renal stones b/l, diverticulosis, atherosclerosis  Micro Data:  Blood 8/15 >> NTD COVID 8/15 >> negative MRSA screening positive 8/16  Antimicrobials:  Vancomycin 8/15 >> 8/17 Cefepime 8/15 >> 8/17  Interim history/subjective:  No events overnight, no new complaints  Objective   Blood pressure (!) 79/59, pulse 90, temperature 98.2 F (36.8 C), resp. rate (!) 5, height 5\' 4"  (1.626 m), weight (!) 140.5 kg, SpO2 (!) 88 %.    FiO2 (%):  [21 %] 21 %   Intake/Output Summary (Last 24 hours) at 10/18/2018 0853 Last data filed at 10/18/2018 0800 Gross per 24 hour  Intake 902.18 ml  Output -  Net 902.18 ml   Filed Weights   10/17/2018 2100  Weight: (!) 140.5 kg   Examination:  General - Unresponsive, appears dead HEENT - [place]/AT, no pupillary reflexes, no EOM, DMM Cardiac - RRR, Nl S1/S2 and -M/R/G Chest - Barely audible BS diffusely Abdomen - Soft, NT, obese, no BS Extremities - Diffuse anasarca Skin - Pressure wounds in several locations Neuro - Obtunded  I reviewed CXR myself, NGT in place, RIJ in place with RLL haziness noted  Resolved Hospital  Problem list     Assessment & Plan:   Acute respiratory failure in setting PNA, sepsis, AKI, morbid obesity and presumed sleep disordered breathing. - Transfer to 6N - D/C O2 - D/C BiPAP - DNR/DNI, no pressors  Septic shock from PNA. - Maintain off pressors - KVO IVF - D/C abx - D/C a-line  AKI from ATN. - D/C further blood draws  Acute metabolic encephalopathy. - D/C lactulose - D/C TF - D/C further blood draws  Hypoglycemia. - D/C CBGs - D/C ISS  Pressure wounds. - Focus more on comfort  Labs   CBC: Recent Labs  Lab 10/02/2018 1505  10/23/2018 1521 10/16/18 1205 10/17/18 0017 10/17/18 0200 10/17/18 0808  WBC 11.3*  --   --   --   --  20.7* 21.2*  NEUTROABS 8.0*  --   --   --   --  16.0*  --   HGB 8.6*   < > 10.9* 9.5* 9.9* 8.2* 7.8*  HCT 26.6*   < > 32.0* 28.0* 29.0* 25.2* 24.4*  MCV 92.7  --   --   --   --  91.6 91.4  PLT 272  --   --   --   --  321 317   < > = values in this interval not displayed.    Basic Metabolic Panel: Recent Labs  Lab 10/29/2018 1505 10/26/2018 1520 10/12/2018 1521 10/16/18 1205 10/17/18 0017 10/17/18 0200  NA 140  140 139 142 143 142  K 4.3 4.3 4.3 4.1 3.7 3.6  CL 100  --  99  --   --  106  CO2 28  --   --   --   --  22  GLUCOSE 91  --  81  --   --  138*  BUN 28*  --  35*  --   --  29*  CREATININE 2.96*  --  2.70*  --   --  3.15*  CALCIUM 8.7*  --   --   --   --  7.8*   GFR: Estimated Creatinine Clearance: 28 mL/min (A) (by C-G formula based on SCr of 3.15 mg/dL (H)). Recent Labs  Lab 10/01/2018 1505 10/18/2018 2309 10/17/18 0200 10/17/18 0808  WBC 11.3*  --  20.7* 21.2*  LATICACIDVEN 3.1* 2.3* 3.5* 3.2*    Liver Function Tests: Recent Labs  Lab 10/31/2018 1505 10/17/18 0200  AST 51* 85*  ALT 40 52*  ALKPHOS 123 116  BILITOT 1.5* 1.5*  PROT 6.7 6.3*  ALBUMIN 1.8* 1.6*   No results for input(s): LIPASE, AMYLASE in the last 168 hours. Recent Labs  Lab 10/24/2018 1930 10/17/18 0200  AMMONIA 112* 118*    ABG     Component Value Date/Time   PHART 7.407 10/17/2018 0017   PCO2ART 38.4 10/17/2018 0017   PO2ART 108.0 10/17/2018 0017   HCO3 24.3 10/17/2018 0017   TCO2 26 10/17/2018 0017   O2SAT 98.0 10/17/2018 0017     Coagulation Profile: Recent Labs  Lab 10/09/2018 2200  INR 1.2    Cardiac Enzymes: Recent Labs  Lab 10/02/2018 1505  CKTOTAL 143    HbA1C: Hgb A1c MFr Bld  Date/Time Value Ref Range Status  03/10/2018 09:40 AM 6.5 (H) 4.8 - 5.6 % Final    Comment:    (NOTE) Pre diabetes:          5.7%-6.4% Diabetes:              >6.4% Glycemic control for   <7.0% adults with diabetes     CBG: Recent Labs  Lab 10/16/18 2331 10/17/18 0330 10/17/18 0739 10/17/18 1130 10/17/18 1539  GLUCAP 108* 131* 145* 144* 139*   Will transfer to palliative care floor and back to FPTS with PCCM off 8/19  Rush Farmer, M.D. [facility] Pulmonary/Critical Care Medicine. Pager: [phone]. After hours pager: [phone].

## 2018-10-18 NOTE — Progress Notes (Signed)
Nutrition Brief Note  Chart reviewed. Pt now transitioning to comfort care.  No further nutrition interventions warranted at this time.  Please re-consult as needed.    Huong Luthi MS, RDN, LDN, CNSC [phone] Pager  [phone] Weekend/On-Call Pager    

## 2018-10-20 LAB — CULTURE, BLOOD (ROUTINE X 2)

## 2018-11-01 NOTE — Death Summary Note (Addendum)
[facility] Death Summary  Patient name: Stephanie [name] Medical record number: [id number] Date of birth: 1960-11-26 Age: 58 y.o. Gender: female Date of Admission: 10/27/2018  Date of Death: Oct 31, 2018 Admitting Physician: Wilber Oliphant, MD  Primary Care Provider: Patient, No Pcp Per Consultants: CCM  Indication for Hospitalization: AMS and fatigue  Discharge Diagnoses/Problem List:  Principle Diagnosis: Sepsis  Other active diagnoses: acute AMS HTN Chronic respiratory failure Anemia, normocytic, chronic AKI Intertriginous dermatitis  Disposition: Deceased.  Brief Hospital Course:  Was admitted 10-27-2022 for altered mental status with increased fatigue and decreased oral intake.  Multiple pressure ulcers were found to be grossly breaking down, extensive wound care provided. She continued to decline. Appeared too altered to take anything orally.  Her ammonia level was 112.  On chest x-ray signs of a pneumonia infection were found. She continued to have worsening hypotension and continued to have no urine output for several days.  She was given IV fluid boluses and critical care medicine was consulted.  She was transferred to the ICU.  Central venous catheter was placed by CCM.  Began to suffer from multiorgan failure.  Family was contacted and informed that she would not likely survive.  Full comfort care was initiated and patient was transferred back to 6 N. patient's time of death was 9:45 AM Oct 31, 2018.  Significant Procedures: Central line placed  Significant Labs and Imaging:   CT ABDOMEN AND PELVIS WITHOUT CONTRAST Result date Oct 27, 2018 IMPRESSION: 1. Examination limited by patient body habitus and significant streak artifact from the patient's arms. 2. Severe hepatic steatosis. 3. Bilateral nonobstructing renal nephroliths. 4. Probable chronic periumbilical fluid collection without definite CT evidence for an abscess. 5. Cardiomegaly. 6. Bibasilar  airspace opacities favored to represent atelectasis. An infiltrate is not excluded and should be correlated clinically. 7.  Aortic Atherosclerosis (ICD10-I70.0).  PORTABLE CHEST 1 VIEW Result date 10-27-18 COMPARISON:  03/13/2018. IMPRESSION: 1. Right basilar atelectasis or scarring with mild improvement. 2. Stable cardiomegaly.  PORTABLE ABDOMEN - 1 VIEW Result date 10/16/2018 COMPARISON:  Abdominal CT from yesterday IMPRESSION: 1. Nasogastric tube with tip at the distal stomach. 2. Right nephrolithiasis.  PORTABLE CHEST 1 VIEW Result date 10/17/2018 COMPARISON:  Most recent comparison Oct 27, 2018 IMPRESSION: Right IJ central catheter tip terminates near the level of the superior cavoatrial junction, approximation of midline is likely related to patient rotation on this portable film. Hazy atelectatic changes in the lungs with improving airspace opacity in the right lung base. Transesophageal tube tip near the gastric antrum.   Gerlene Fee, DO Oct 31, 2018, 8:58 PM PGY-2, [place]

## 2018-11-01 NOTE — Progress Notes (Signed)
Wasted 50 ml of morphine. Witnessed by Towanda Octave

## 2018-11-01 DEATH — deceased

## 2020-08-03 IMAGING — DX DG CHEST 1V PORT
2 series · 2 of 2 positions shown · non-contrast
Comparison: 01/06/2018

CLINICAL DATA: Respiratory failure

EXAM:
PORTABLE CHEST 1 VIEW

[chest ap (1 of 2)]
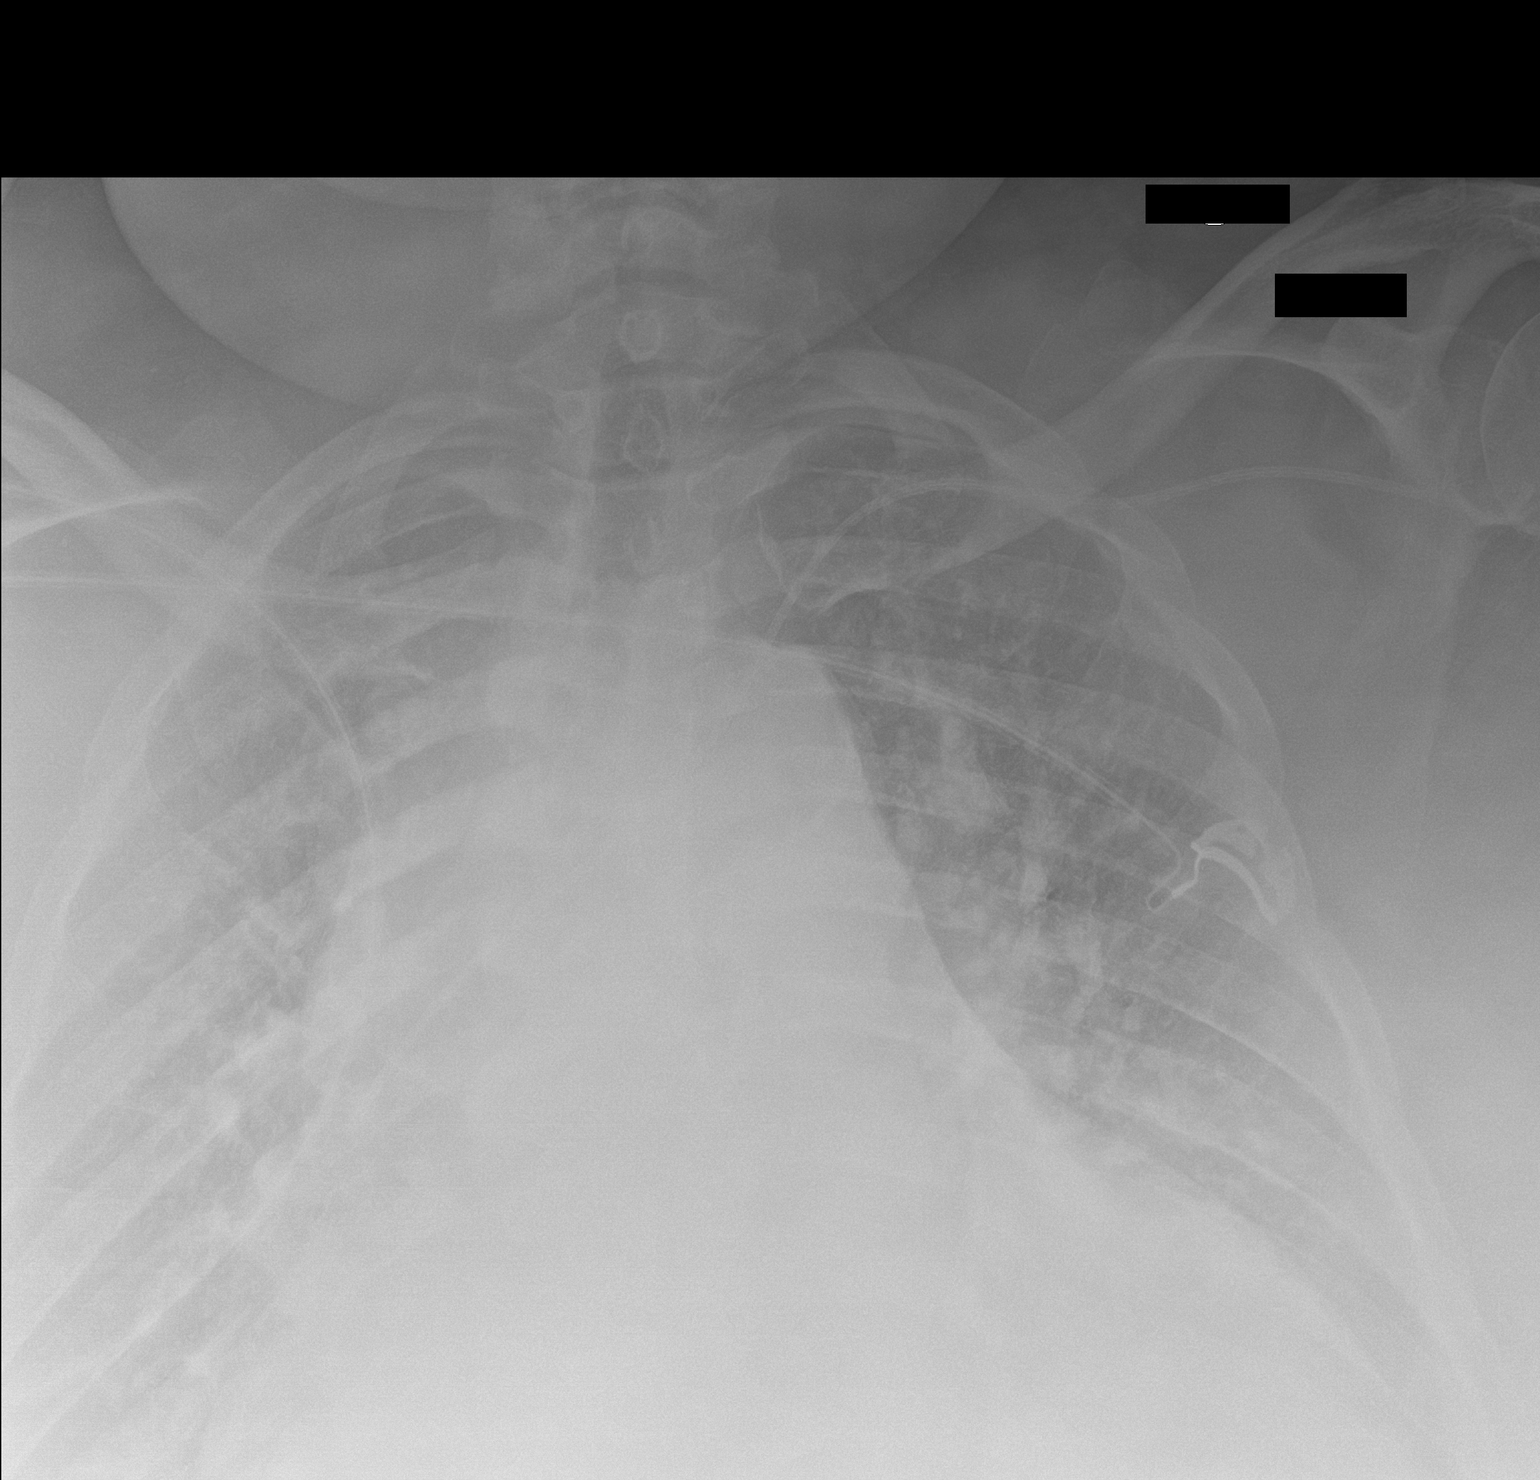

[chest ap (2 of 2)]
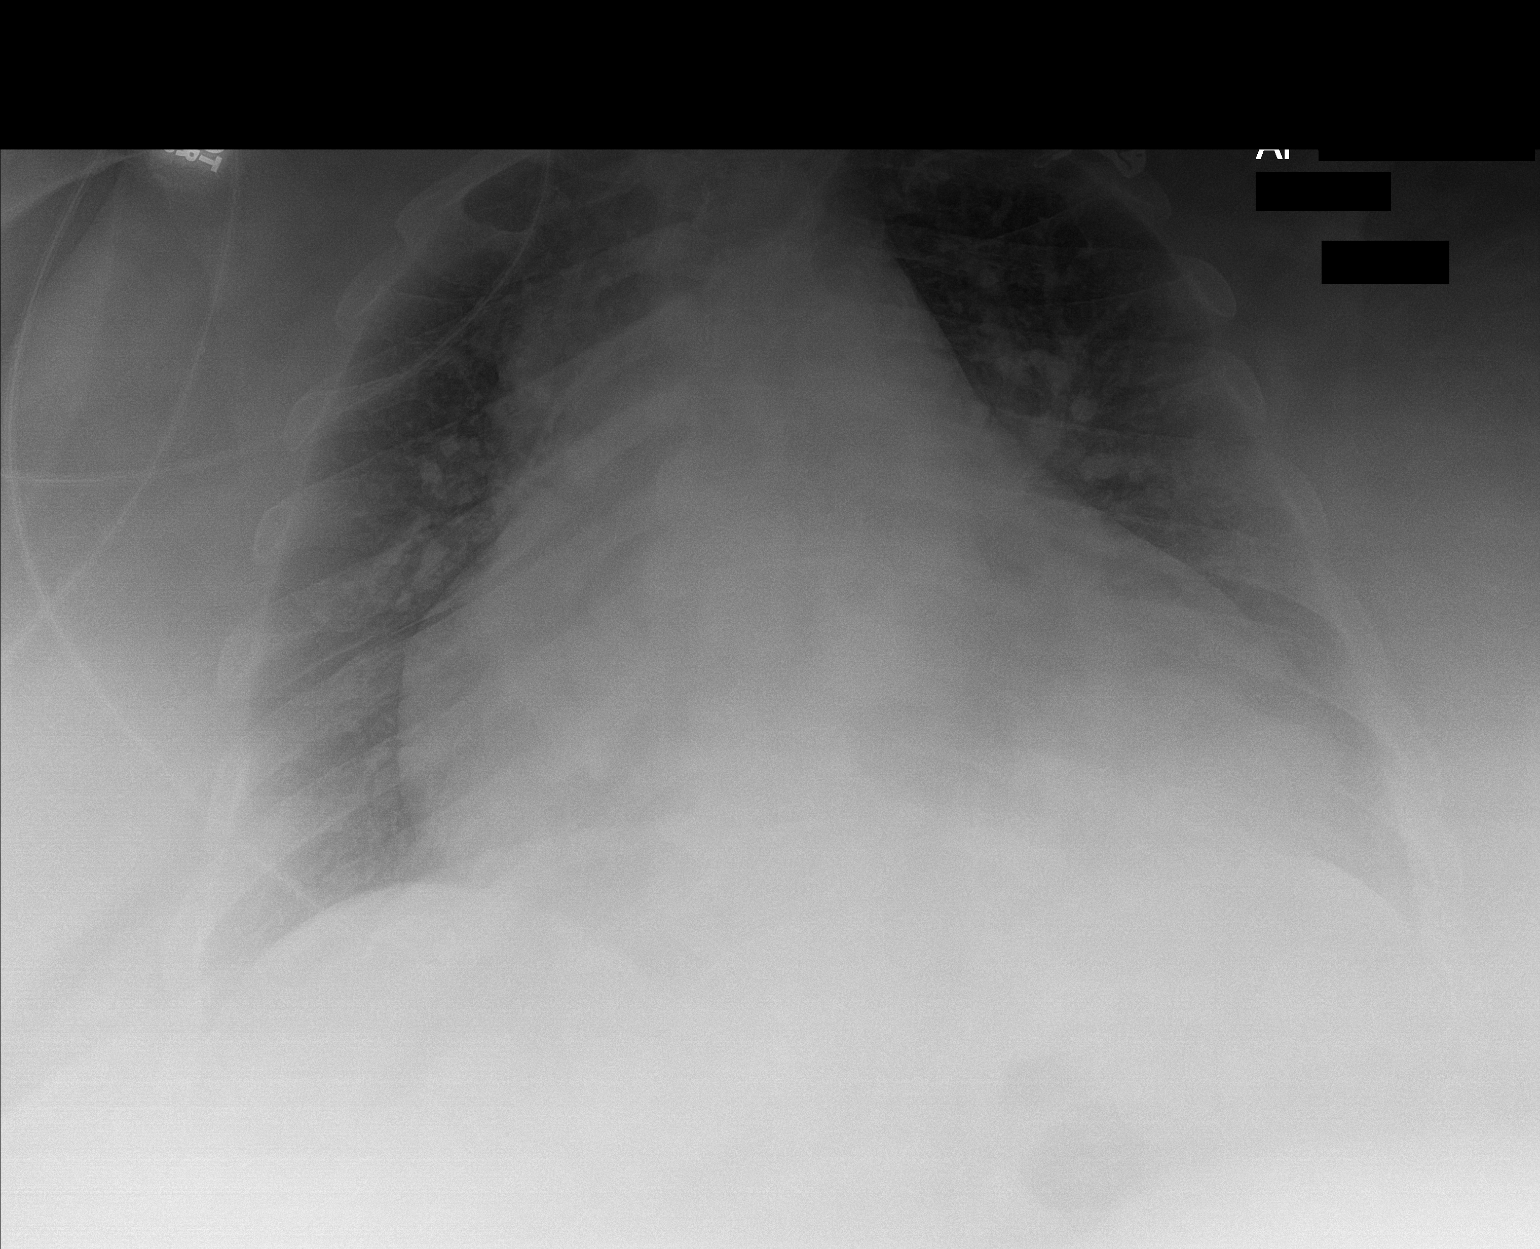

[2 of 2 positions shown; findings below may reference images not displayed]

FINDINGS: Endotracheal tube removed.  Left arm PICC tip remains in the SVC.

Cardiac enlargement. Improvement in vascular congestion and edema
since the prior study. Improved aeration of the lung bases. No
effusion
IMPRESSION: Endotracheal tube removed.

Improved aeration of the lungs with decrease vascular congestion and
edema.

## 2020-08-07 IMAGING — DX DG CHEST 1V
1 series · 1 of 1 positions shown · non-contrast
Comparison: 01/12/2018

CLINICAL DATA: Hypoxia.

EXAM:
CHEST  1 VIEW

[chest ap]
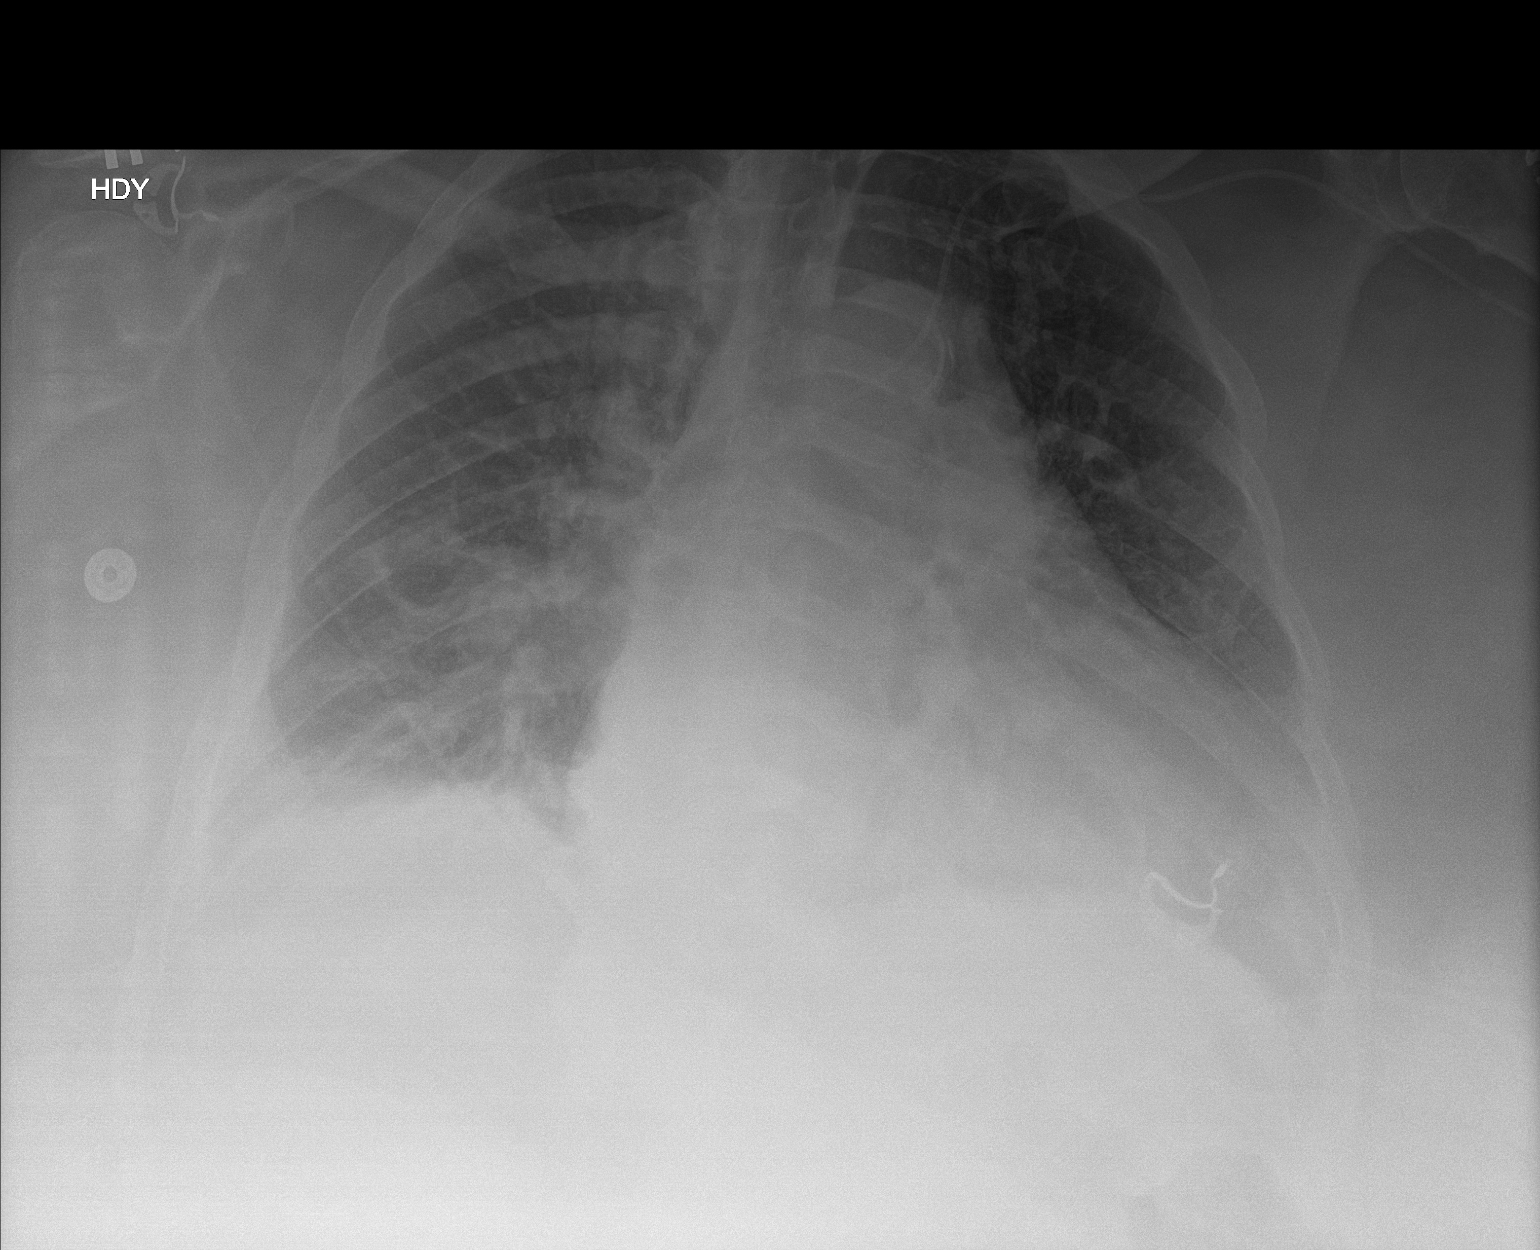

[1 of 1 positions shown; findings below may reference images not displayed]

FINDINGS: Left PICC is unchanged. The cardiac silhouette remains enlarged.
Aortic atherosclerosis is noted. There is persistent pulmonary
vascular congestion. The interstitial markings are slightly less
prominent than on the prior study without overt edema. Streaky
opacity in both lung bases is new or increased. A trace right
pleural effusion is questioned. No pneumothorax is identified.
IMPRESSION: 1. Cardiomegaly and pulmonary vascular congestion without overt
edema.
2. Bibasilar opacities, likely atelectasis.

## 2020-08-09 IMAGING — DX DG CHEST 1V
1 series · 1 of 1 positions shown · non-contrast
Comparison: 01/15/2018.

CLINICAL DATA: Fever.

EXAM:
CHEST  1 VIEW

[chest ap]
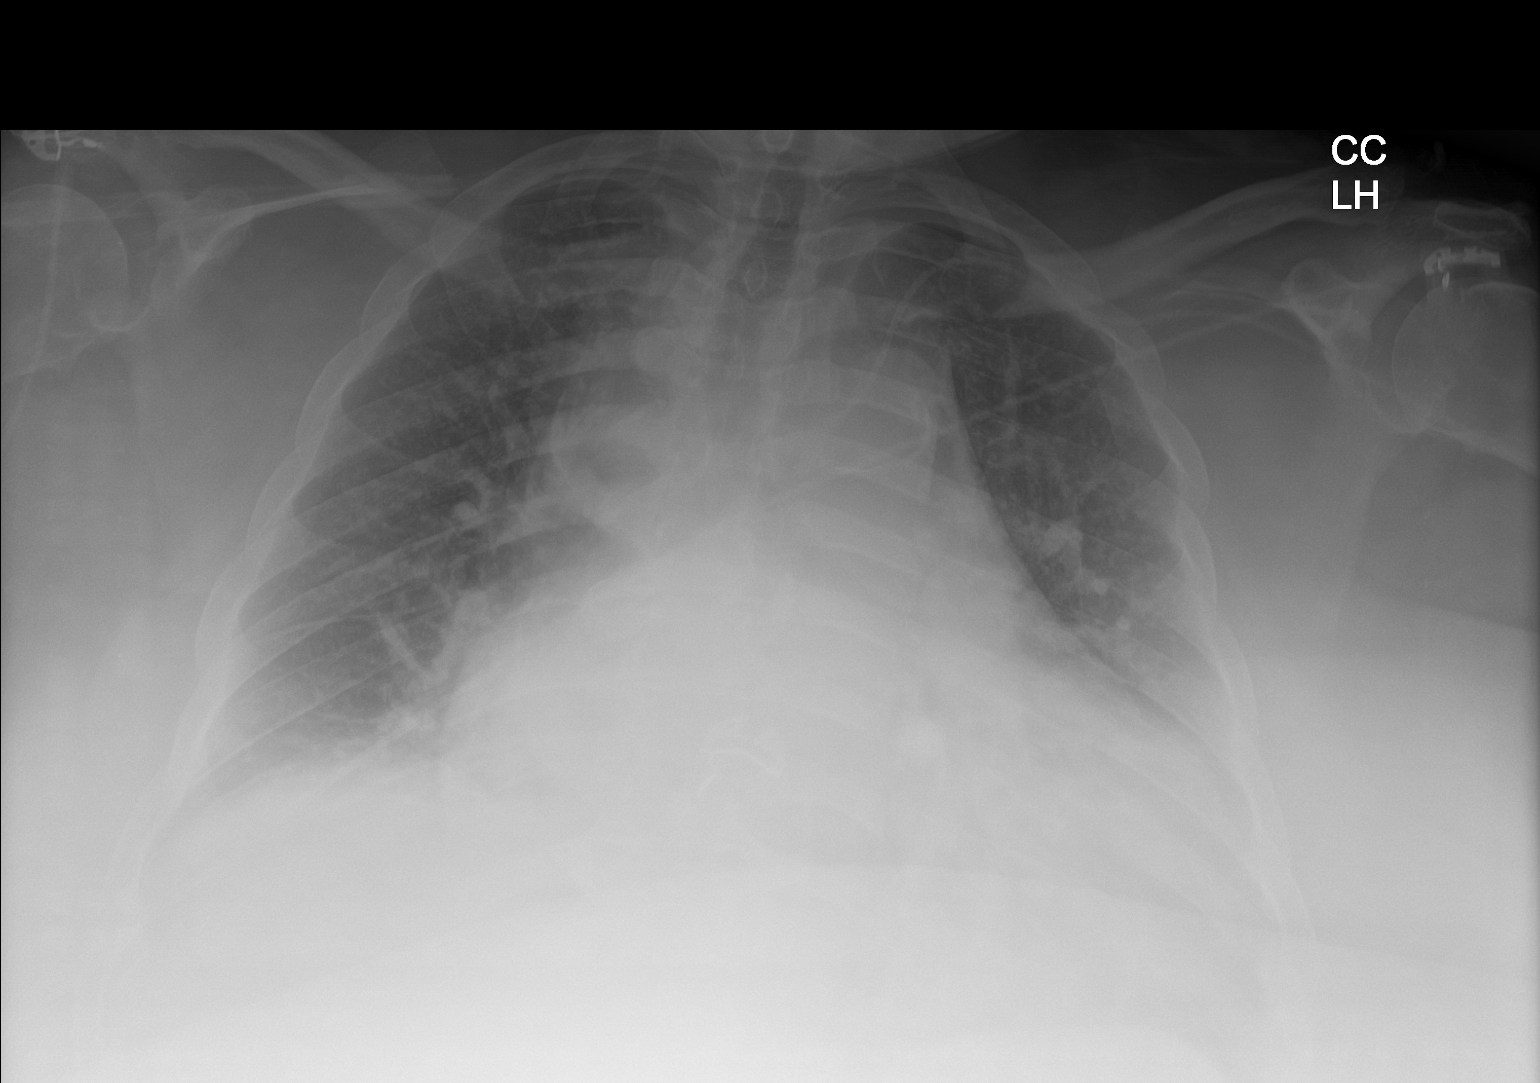

[1 of 1 positions shown; findings below may reference images not displayed]

FINDINGS: Trachea is midline. Heart is enlarged, as before. Left PICC tip
projects over the SVC. Thoracic aorta is calcified. Suspect mild
central pulmonary vascular congestion and central left lower lobe
airspace opacification. Body habitus is limiting.
IMPRESSION: 1. Left lower lobe airspace opacification may be due to atelectasis
or developing pneumonia.
2. Mild central pulmonary vascular congestion.
3.  Aortic atherosclerosis ([id number]).

## 2020-08-11 IMAGING — DX DG CHEST 1V PORT
1 series · 1 of 1 positions shown · non-contrast
Comparison: 01/17/2018.

CLINICAL DATA: Shortness of breath.

EXAM:
PORTABLE CHEST 1 VIEW

[chest ap]
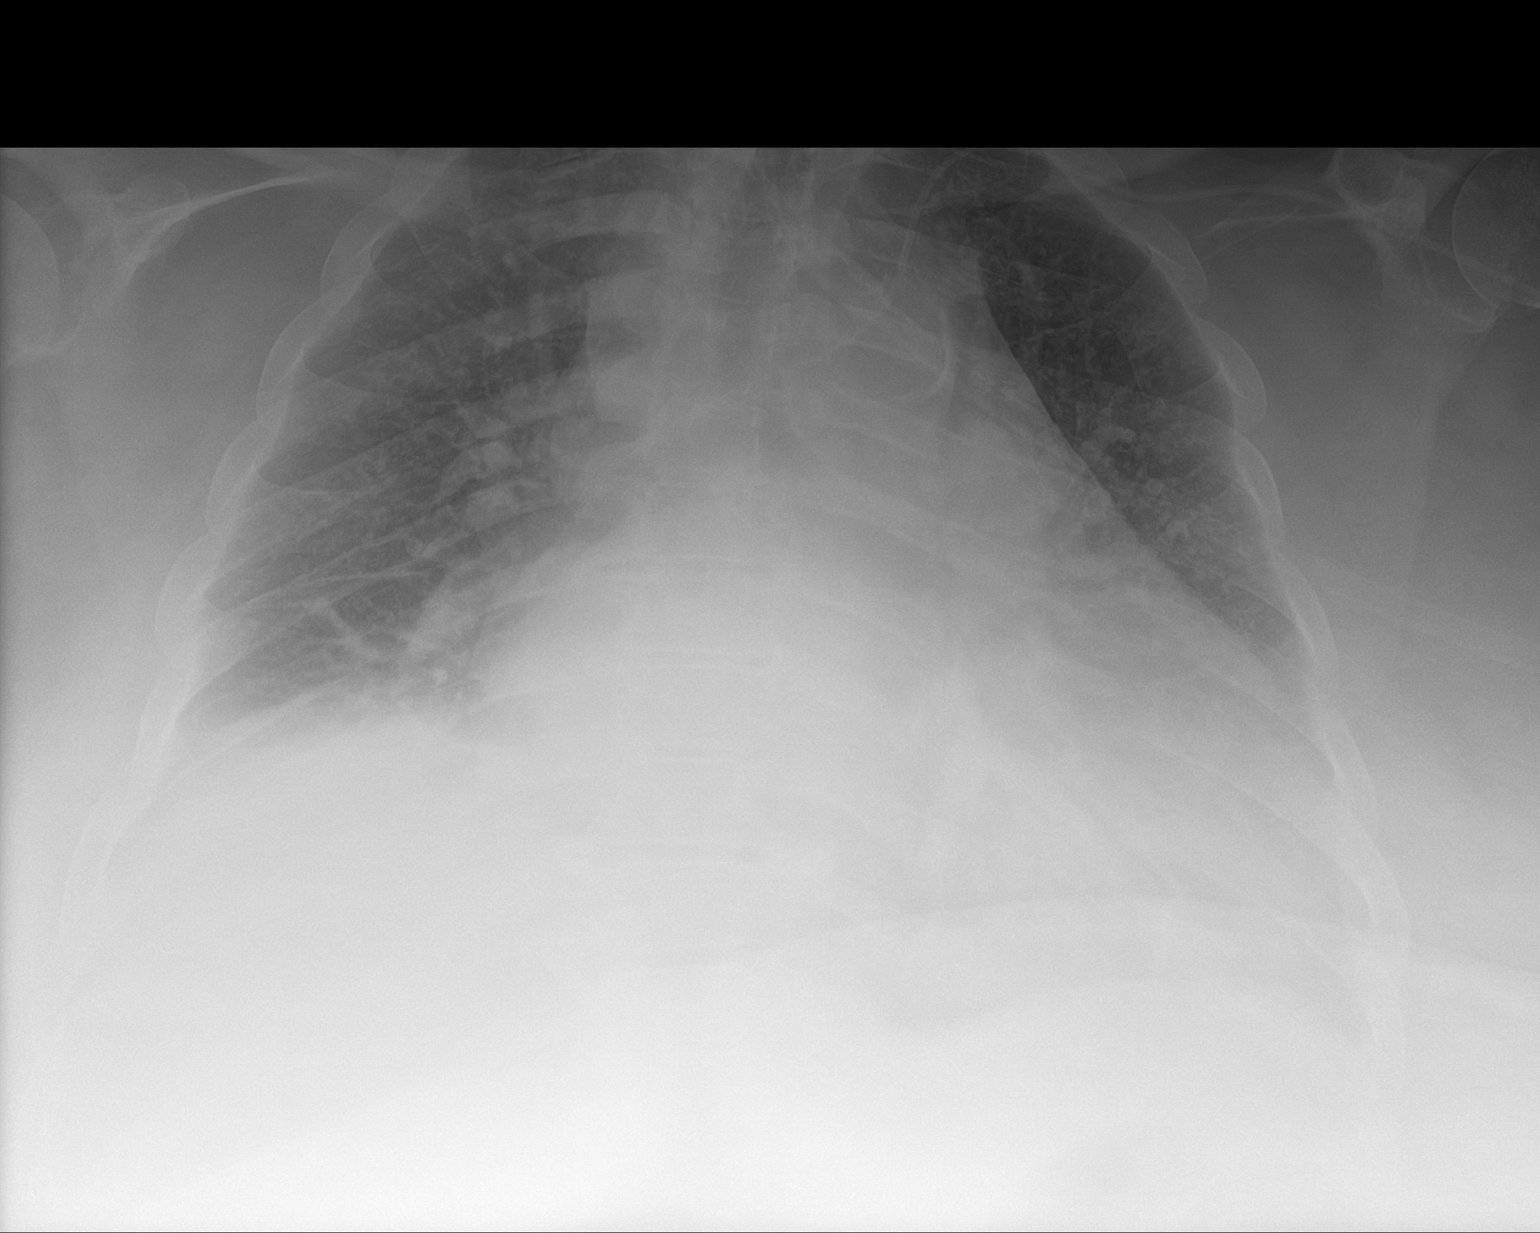

[1 of 1 positions shown; findings below may reference images not displayed]

FINDINGS: Low lung volumes. Marked cardiac enlargement. Developing pulmonary
edema, increasing BILATERAL pulmonary opacities, with small
effusions. Retrocardiac density remains, either atelectasis or
developing infiltrate. Thoracic atherosclerosis. No osseous
findings.
IMPRESSION: Marked cardiac enlargement, with developing pulmonary edema.
Persistent retrocardiac density, atelectasis versus infiltrate.

## 2020-08-13 IMAGING — DX DG CHEST 1V
1 series · 1 of 1 positions shown · non-contrast
Comparison: 01/19/2018.

CLINICAL DATA: Shortness of breath.

EXAM:
CHEST  1 VIEW

[chest ap]
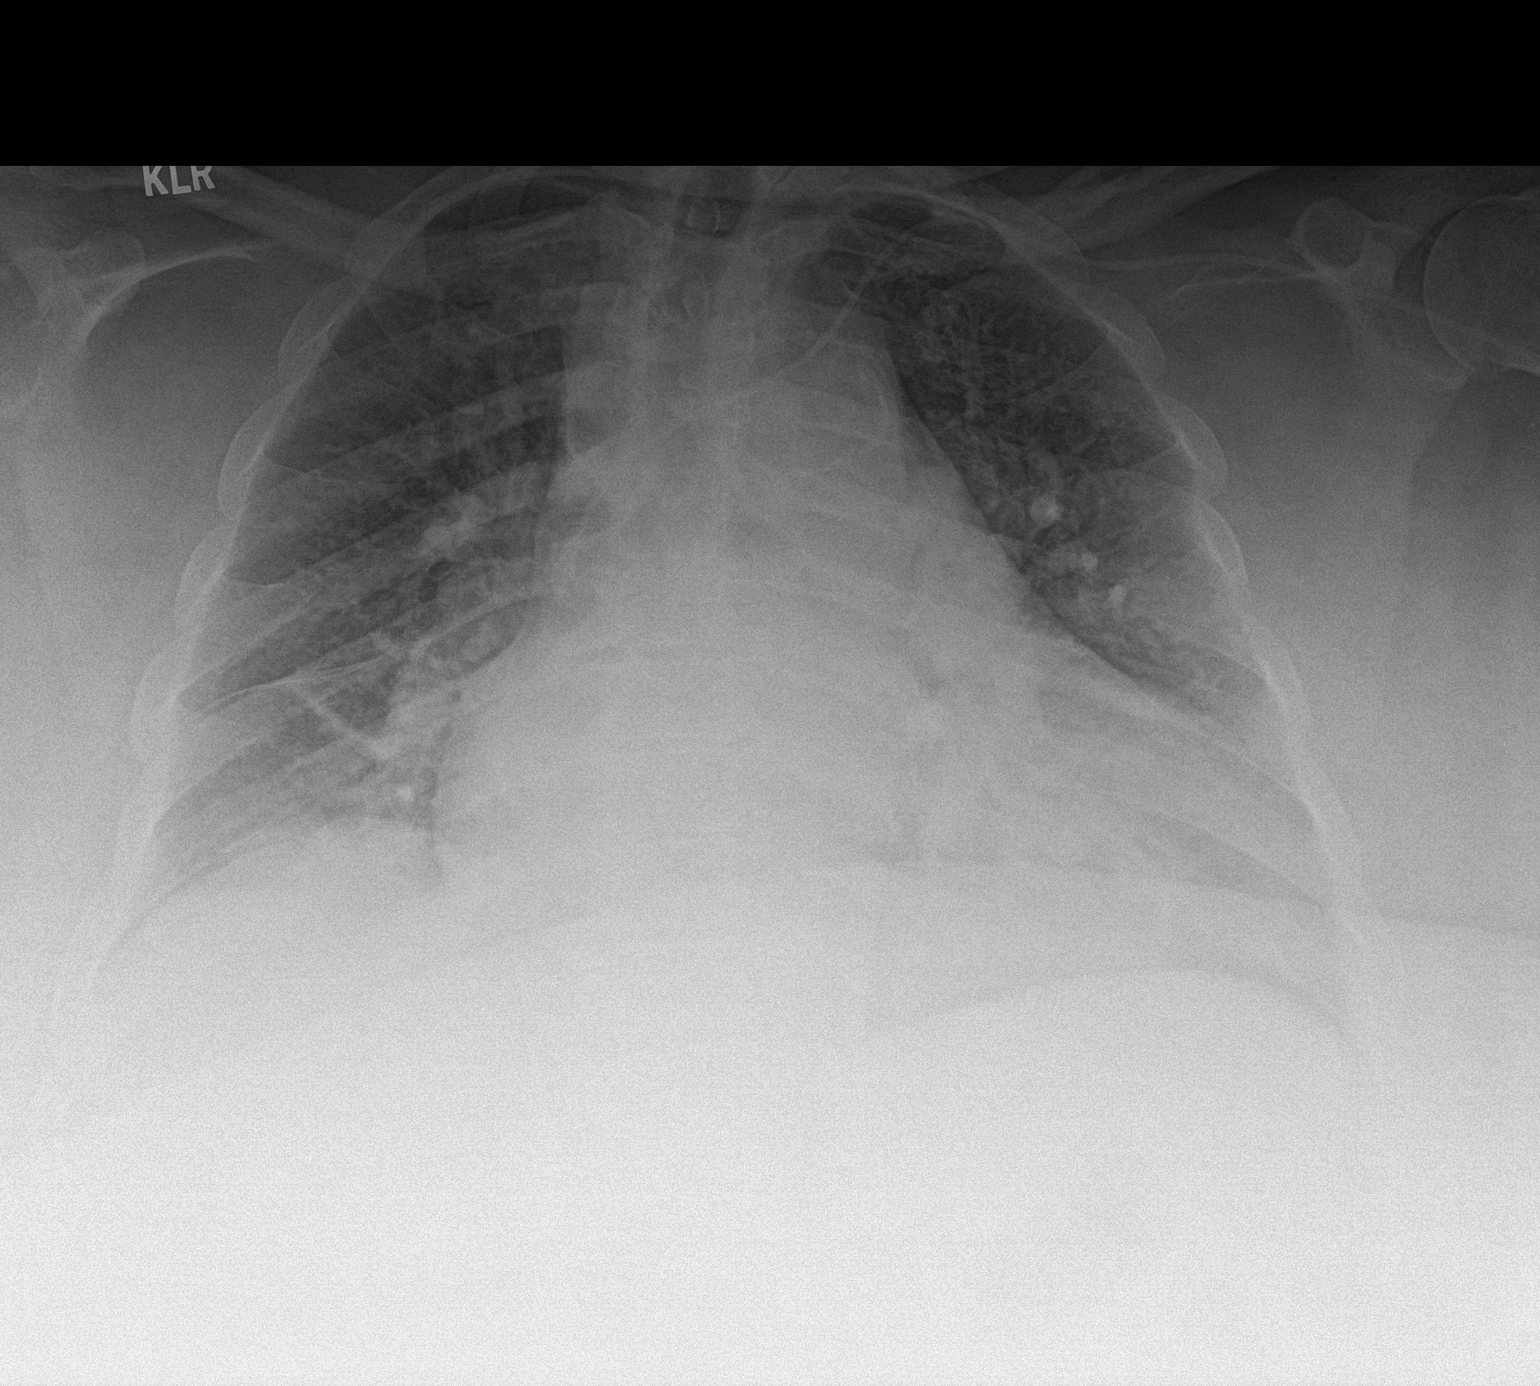

[1 of 1 positions shown; findings below may reference images not displayed]

FINDINGS: Left PICC line noted with tip over superior vena cava. Cardiomegaly
with pulmonary vascular prominence and bilateral interstitial
prominence consistent with CHF. No prominent pleural effusion. No
pneumothorax.
IMPRESSION: 1.  Left PICC line noted with tip over superior vena cava.

2. Cardiomegaly with pulmonary vascular prominence and bilateral
interstitial prominence consistent with CHF.
